# Patient Record
Sex: Male | Born: 1950 | Race: Black or African American | Hispanic: No | Smoking: Former smoker
Health system: Southern US, Community
[De-identification: ages and names within clinical notes are randomized; demographics above are authoritative.]

## PROBLEM LIST (undated history)

## (undated) DIAGNOSIS — E785 Hyperlipidemia, unspecified: Secondary | ICD-10-CM

## (undated) DIAGNOSIS — Z9289 Personal history of other medical treatment: Secondary | ICD-10-CM

## (undated) DIAGNOSIS — D494 Neoplasm of unspecified behavior of bladder: Secondary | ICD-10-CM

## (undated) DIAGNOSIS — M1990 Unspecified osteoarthritis, unspecified site: Secondary | ICD-10-CM

## (undated) DIAGNOSIS — I447 Left bundle-branch block, unspecified: Secondary | ICD-10-CM

## (undated) DIAGNOSIS — E119 Type 2 diabetes mellitus without complications: Secondary | ICD-10-CM

## (undated) DIAGNOSIS — I639 Cerebral infarction, unspecified: Secondary | ICD-10-CM

## (undated) DIAGNOSIS — I5189 Other ill-defined heart diseases: Secondary | ICD-10-CM

## (undated) DIAGNOSIS — I10 Essential (primary) hypertension: Secondary | ICD-10-CM

## (undated) DIAGNOSIS — K219 Gastro-esophageal reflux disease without esophagitis: Secondary | ICD-10-CM

## (undated) HISTORY — DX: Type 2 diabetes mellitus without complications: E11.9

## (undated) HISTORY — DX: Left bundle-branch block, unspecified: I44.7

## (undated) HISTORY — DX: Cerebral infarction, unspecified: I63.9

## (undated) HISTORY — DX: Hyperlipidemia, unspecified: E78.5

## (undated) HISTORY — PX: COLONOSCOPY: SHX174

## (undated) HISTORY — DX: Essential (primary) hypertension: I10

## (undated) HISTORY — DX: Personal history of other medical treatment: Z92.89

## (undated) HISTORY — DX: Other ill-defined heart diseases: I51.89

---

## 2007-04-02 ENCOUNTER — Ambulatory Visit: Payer: Self-pay | Admitting: Family Medicine

## 2007-05-02 ENCOUNTER — Ambulatory Visit: Payer: Self-pay | Admitting: Gastroenterology

## 2008-08-25 ENCOUNTER — Emergency Department: Payer: Self-pay | Admitting: Internal Medicine

## 2009-01-06 ENCOUNTER — Emergency Department: Payer: Self-pay | Admitting: Emergency Medicine

## 2009-03-03 ENCOUNTER — Emergency Department: Payer: Self-pay | Admitting: Unknown Physician Specialty

## 2009-03-06 ENCOUNTER — Emergency Department: Payer: Self-pay | Admitting: Emergency Medicine

## 2009-08-18 ENCOUNTER — Ambulatory Visit: Payer: Self-pay | Admitting: Gastroenterology

## 2013-03-26 ENCOUNTER — Ambulatory Visit: Payer: Self-pay | Admitting: Family Medicine

## 2013-03-26 LAB — DOT URINE DIP
Glucose,UR: 1000 mg/dL (ref 0–75)
Protein: NEGATIVE

## 2013-04-12 ENCOUNTER — Emergency Department: Payer: Self-pay | Admitting: Emergency Medicine

## 2014-07-27 ENCOUNTER — Ambulatory Visit: Payer: Self-pay | Admitting: Internal Medicine

## 2017-08-07 ENCOUNTER — Other Ambulatory Visit: Payer: Self-pay

## 2017-08-07 MED ORDER — AMLODIPINE BESYLATE 10 MG PO TABS: 10.0000 mg | ORAL_TABLET | Freq: Every day | ORAL | 3 refills | Status: DC

## 2017-08-07 MED ORDER — LISINOPRIL-HYDROCHLOROTHIAZIDE 20-12.5 MG PO TABS: ORAL_TABLET | ORAL | 3 refills | Status: DC

## 2017-08-23 ENCOUNTER — Other Ambulatory Visit: Payer: Self-pay

## 2017-08-23 MED ORDER — LIRAGLUTIDE 18 MG/3ML ~~LOC~~ SOPN: 1.8000 mg | PEN_INJECTOR | Freq: Every day | SUBCUTANEOUS | 5 refills | Status: DC

## 2017-09-25 ENCOUNTER — Ambulatory Visit: Payer: Self-pay | Admitting: Nurse Practitioner

## 2017-10-15 ENCOUNTER — Other Ambulatory Visit: Payer: Self-pay | Admitting: Nurse Practitioner

## 2017-10-16 LAB — COMPREHENSIVE METABOLIC PANEL
ALBUMIN: 4.6 g/dL (ref 3.6–4.8)
ALT: 43 IU/L (ref 0–44)
AST: 32 IU/L (ref 0–40)
Albumin/Globulin Ratio: 1.5 (ref 1.2–2.2)
Alkaline Phosphatase: 173 IU/L — ABNORMAL HIGH (ref 39–117)
BUN / CREAT RATIO: 9 — AB (ref 10–24)
BUN: 12 mg/dL (ref 8–27)
Bilirubin Total: 0.2 mg/dL (ref 0.0–1.2)
CALCIUM: 10.3 mg/dL — AB (ref 8.6–10.2)
CO2: 23 mmol/L (ref 20–29)
CREATININE: 1.33 mg/dL — AB (ref 0.76–1.27)
Chloride: 101 mmol/L (ref 96–106)
GFR, EST AFRICAN AMERICAN: 64 mL/min/{1.73_m2} (ref >59)
GFR, EST NON AFRICAN AMERICAN: 56 mL/min/{1.73_m2} — AB (ref >59)
GLUCOSE: 181 mg/dL — AB (ref 65–99)
Globulin, Total: 3.1 g/dL (ref 1.5–4.5)
Potassium: 4 mmol/L (ref 3.5–5.2)
Sodium: 147 mmol/L — ABNORMAL HIGH (ref 134–144)
TOTAL PROTEIN: 7.7 g/dL (ref 6.0–8.5)

## 2017-10-18 ENCOUNTER — Ambulatory Visit: Payer: BLUE CROSS/BLUE SHIELD | Admitting: Nurse Practitioner

## 2017-10-18 ENCOUNTER — Encounter: Payer: Self-pay | Admitting: Nurse Practitioner

## 2017-10-18 VITALS — BP 145/85 | HR 81 | Resp 16 | Ht 74.0 in | Wt 215.2 lb

## 2017-10-18 DIAGNOSIS — I10 Essential (primary) hypertension: Secondary | ICD-10-CM | POA: Diagnosis not present

## 2017-10-18 DIAGNOSIS — E1140 Type 2 diabetes mellitus with diabetic neuropathy, unspecified: Secondary | ICD-10-CM | POA: Diagnosis not present

## 2017-10-18 DIAGNOSIS — J029 Acute pharyngitis, unspecified: Secondary | ICD-10-CM

## 2017-10-18 DIAGNOSIS — E11649 Type 2 diabetes mellitus with hypoglycemia without coma: Secondary | ICD-10-CM

## 2017-10-18 DIAGNOSIS — J0140 Acute pansinusitis, unspecified: Secondary | ICD-10-CM

## 2017-10-18 DIAGNOSIS — Z794 Long term (current) use of insulin: Secondary | ICD-10-CM

## 2017-10-18 LAB — POCT GLYCOSYLATED HEMOGLOBIN (HGB A1C): Hemoglobin A1C: 8.1

## 2017-10-18 MED ORDER — LISINOPRIL-HYDROCHLOROTHIAZIDE 20-12.5 MG PO TABS: ORAL_TABLET | ORAL | 3 refills | Status: DC

## 2017-10-18 MED ORDER — GABAPENTIN 100 MG PO CAPS: 100.0000 mg | ORAL_CAPSULE | Freq: Every day | ORAL | 5 refills | Status: DC

## 2017-10-18 MED ORDER — AZITHROMYCIN 250 MG PO TABS: ORAL_TABLET | ORAL | 0 refills | Status: DC

## 2017-10-18 MED ORDER — GABAPENTIN 100 MG PO CAPS: 100.0000 mg | ORAL_CAPSULE | Freq: Every day | ORAL | 1 refills | Status: DC

## 2017-10-18 MED ORDER — FIRST-DUKES MOUTHWASH MT SUSP: OROMUCOSAL | 0 refills | Status: DC

## 2017-10-18 NOTE — Progress Notes (Signed)
[facility] [address] [place], [place] [address]  Internal MEDICINE  Office Visit Note  Patient Name: Gabriel Burton  [id number]  [id number]  Date of Service: 11/11/2017   Pt is here for routine follow up  Chief Complaint  Patient presents with  . Diabetes  . Sore Throat    The patient is here for routine follow up visit. Today, he is reporting blood sugars between 120 and 150. He feels like his sugars are doing ok, overall.  He has sore throat and congestion. Has been going on a little over a week.  Reports right knee pain/weakness. Gives out on him from time to time. Tenderness is along the inner aspect of the knee. Along the "ACL"   .    Current Medication: Outpatient Encounter Medications as of 10/18/2017  Medication Sig Note  . aspirin EC 81 MG tablet Take 81 mg by mouth.   Marland Kitchen atorvastatin (LIPITOR) 10 MG tablet Take 10 mg by mouth.   . Cholecalciferol (VITAMIN D3) 5000 units TABS Take by mouth.   Marland Kitchen CINNAMON PO Take by mouth.   . gabapentin (NEURONTIN) 100 MG capsule Take 1 capsule (100 mg total) by mouth daily.   . Insulin Glargine 300 UNIT/ML SOPN Inject into the skin. 10/18/2017: Take 30 units [place] QD   . liraglutide (VICTOZA) 18 MG/3ML SOPN Inject 0.3 mLs (1.8 mg total) into the skin daily.   Marland Kitchen lisinopril-hydrochlorothiazide (PRINZIDE,ZESTORETIC) 20-12.5 MG tablet Take 2 tab by po once a daily for hypertension   . tamsulosin (FLOMAX) 0.4 MG CAPS capsule Take 0.4 mg by mouth.   Nathen May SOLOSTAR 300 UNIT/ML SOPN Inject 30 Units into the skin daily.   . traMADol (ULTRAM) 50 MG tablet Take 50 mg by mouth.   . vitamin C (ASCORBIC ACID) 500 MG tablet Take 500 mg by mouth.   . [DISCONTINUED] amLODipine (NORVASC) 10 MG tablet Take 1 tablet (10 mg total) by mouth daily.   . [DISCONTINUED] gabapentin (NEURONTIN) 100 MG capsule Take 100 mg by mouth.   . [DISCONTINUED] gabapentin (NEURONTIN) 100 MG capsule Take 1 capsule (100 mg total) by mouth daily.   .  [DISCONTINUED] lisinopril-hydrochlorothiazide (PRINZIDE,ZESTORETIC) 20-12.5 MG tablet Take 2 tab by po once a daily for hypertension   . azithromycin (ZITHROMAX) 250 MG tablet z-pack - take as directed for 5 days   . Diphenhyd-Hydrocort-Nystatin (FIRST-DUKES MOUTHWASH) SUSP Swish and sallow QID prn sore throat.   . pantoprazole (PROTONIX) 40 MG tablet Take 40 mg by mouth.   . [DISCONTINUED] atorvastatin (LIPITOR) 10 MG tablet    . [DISCONTINUED] glimepiride (AMARYL) 2 MG tablet Take 2 mg by mouth.   . [DISCONTINUED] tamsulosin (FLOMAX) 0.4 MG CAPS capsule     No facility-administered encounter medications on file as of 10/18/2017.     Surgical History: History reviewed. No pertinent surgical history.  Medical History: No past medical history on file.  Family History: No family history on file.  Social History   Socioeconomic History  . Marital status: Widowed    Spouse name: Not on file  . Number of children: Not on file  . Years of education: Not on file  . Highest education level: Not on file  Occupational History  . Not on file  Social Needs  . Financial resource strain: Not on file  . Food insecurity:    Worry: Not on file    Inability: Not on file  . Transportation needs:    Medical: Not on file  Non-medical: Not on file  Tobacco Use  . Smoking status: Never Smoker  . Smokeless tobacco: Never Used  Substance and Sexual Activity  . Alcohol use: Never    Frequency: Never  . Drug use: Never  . Sexual activity: Not on file  Lifestyle  . Physical activity:    Days per week: Not on file    Minutes per session: Not on file  . Stress: Not on file  Relationships  . Social connections:    Talks on phone: Not on file    Gets together: Not on file    Attends religious service: Not on file    Active member of club or organization: Not on file    Attends meetings of clubs or organizations: Not on file    Relationship status: Not on file  . Intimate partner  violence:    Fear of current or ex partner: Not on file    Emotionally abused: Not on file    Physically abused: Not on file    Forced sexual activity: Not on file  Other Topics Concern  . Not on file  Social History Narrative  . Not on file      Review of Systems  Constitutional: Negative for activity change, chills, fatigue and unexpected weight change.  HENT: Positive for congestion, postnasal drip, rhinorrhea, sore throat and voice change. Negative for sneezing.   Eyes: Negative.  Negative for redness.  Respiratory: Positive for cough. Negative for chest tightness and shortness of breath.   Cardiovascular: Negative for chest pain and palpitations.  Gastrointestinal: Negative for abdominal pain, constipation, diarrhea, nausea and vomiting.  Endocrine:       Blood sugars doing well   Genitourinary: Negative.  Negative for dysuria and frequency.  Musculoskeletal: Positive for arthralgias. Negative for back pain, joint swelling and neck pain.       Right knee pain/weakness along the inner aspect of right knee  Allergic/Immunologic: Positive for environmental allergies.  Neurological: Positive for headaches. Negative for tremors and numbness.  Hematological: Negative for adenopathy. Does not bruise/bleed easily.  Psychiatric/Behavioral: Negative for behavioral problems (Depression), sleep disturbance and suicidal ideas. The patient is not nervous/anxious.     Today's Vitals   10/18/17 1434  BP: (!) 145/85  Pulse: 81  Resp: 16  SpO2: 96%  Weight: 215 lb 3.2 oz (97.6 kg)  Height: 6\' 2"  (1.88 m)    Physical Exam  Constitutional: He is oriented to person, place, and time. He appears well-developed and well-nourished. No distress.  HENT:  Head: Normocephalic and atraumatic.  Mouth/Throat: Oropharynx is clear and moist. No oropharyngeal exudate.  Eyes: Pupils are equal, round, and reactive to light. EOM are normal.  Neck: Normal range of motion. Neck supple. No JVD present. No  tracheal deviation present. No thyromegaly present.  Cardiovascular: Normal rate, regular rhythm and normal heart sounds. Exam reveals no gallop and no friction rub.  No murmur heard. Pulmonary/Chest: Effort normal. No respiratory distress. He has no wheezes. He has no rales. He exhibits no tenderness.  Abdominal: Soft. Bowel sounds are normal.  Musculoskeletal: Normal range of motion.  Lymphadenopathy:    He has no cervical adenopathy.  Neurological: He is alert and oriented to person, place, and time. No cranial nerve deficit.  Skin: Skin is warm and dry. He is not diaphoretic.  Psychiatric: He has a normal mood and affect. His behavior is normal. Judgment and thought content normal.  Nursing note and vitals reviewed.   Assessment/Plan: 1. Uncontrolled type  2 diabetes mellitus with hypoglycemia, unspecified hypoglycemia coma status (HCC) - POCT HgB A1C 8.1 today. Gradually improving. Will continue diabetic medications as prescribed. Emphasized importance of following ADA diet and increasing exercise.   2. Type 2 diabetes mellitus with diabetic neuropathy, with long-term current use of insulin (HCC) Well controlled. Continue gabapentin 100mg  daily as needed.  - gabapentin (NEURONTIN) 100 MG capsule; Take 1 capsule (100 mg total) by mouth daily.  Dispense: 90 capsule; Refill: 1  3. Sore throat Swish and swallow [employer] Magic couthwas as needed for sore throat.  - Diphenhyd-Hydrocort-Nystatin (FIRST-DUKES MOUTHWASH) SUSP; Swish and sallow QID prn sore throat.  Dispense: 200 mL; Refill: 0  4. Acute non-recurrent pansinusitis z-pac - takke as directed for 5 days. Rest and increase fluids. Use OTC medications to alleviate symptoms.  - azithromycin (ZITHROMAX) 250 MG tablet; z-pack - take as directed for 5 days  Dispense: 6 tablet; Refill: 0  5. Essential hypertension Stable. Continue bp medication as prescribed.  - lisinopril-hydrochlorothiazide (PRINZIDE,ZESTORETIC) 20-12.5 MG  tablet; Take 2 tab by po once a daily for hypertension  Dispense: 180 tablet; Refill: 3  General Counseling: Paulo verbalizes understanding of the findings of todays visit and agrees with plan of treatment. I have discussed any further diagnostic evaluation that may be needed or ordered today. We also reviewed his medications today. he has been encouraged to call the office with any questions or concerns that should arise related to todays visit.  Rest and increase fluids. Continue using OTC medication to control symptoms.   Diabetes Counseling:  1. Addition of ACE inh/ ARB'S for nephroprotection. 2. Diabetic foot care, prevention of complications.  3.Exercise and lose weight.  4. Diabetic eye examination, 5. Monitor blood sugar closlely. nutrition counseling.  6.Sign and symptoms of hypoglycemia including shaking sweating,confusion and headaches.  This patient was seen by Vincent Gros, FNP- C in Collaboration with Dr Lyndon Code as a part of collaborative care agreement   Orders Placed This Encounter  Procedures  . POCT HgB A1C    Meds ordered this encounter  Medications  . azithromycin (ZITHROMAX) 250 MG tablet    Sig: z-pack - take as directed for 5 days    Dispense:  6 tablet    Refill:  0    Order Specific Question:   Supervising Provider    Answer:   Lyndon Code [[id number]]  . Diphenhyd-Hydrocort-Nystatin (FIRST-DUKES MOUTHWASH) SUSP    Sig: Swish and sallow QID prn sore throat.    Dispense:  200 mL    Refill:  0    Order Specific Question:   Supervising Provider    Answer:   Lyndon Code [[id number]]  . DISCONTD: gabapentin (NEURONTIN) 100 MG capsule    Sig: Take 1 capsule (100 mg total) by mouth daily.    Dispense:  30 capsule    Refill:  5    Order Specific Question:   Supervising Provider    Answer:   Lyndon Code [[id number]]  . lisinopril-hydrochlorothiazide (PRINZIDE,ZESTORETIC) 20-12.5 MG tablet    Sig: Take 2 tab by po once a daily for hypertension    Dispense:   180 tablet    Refill:  3    Please fill as 90 day prescription. thanks    Order Specific Question:   Supervising Provider    Answer:   Lyndon Code [[id number]]  . gabapentin (NEURONTIN) 100 MG capsule    Sig: Take 1 capsule (100 mg total) by mouth daily.  Dispense:  90 capsule    Refill:  1    Please fill as 90 day prescription.    Order Specific Question:   Supervising Provider    Answer:   Lyndon Code [[id number]]    Time spent: 41 Minutes       Dr Lyndon Code Internal medicine

## 2017-11-08 ENCOUNTER — Other Ambulatory Visit: Payer: Self-pay

## 2017-11-08 MED ORDER — INSULIN PEN NEEDLE 32G X 4 MM MISC: 3 refills | Status: DC

## 2017-11-08 MED ORDER — AMLODIPINE BESYLATE 10 MG PO TABS: 10.0000 mg | ORAL_TABLET | Freq: Every day | ORAL | 3 refills | Status: DC

## 2017-11-11 ENCOUNTER — Encounter: Payer: Self-pay | Admitting: Nurse Practitioner

## 2017-11-11 DIAGNOSIS — Z794 Long term (current) use of insulin: Secondary | ICD-10-CM

## 2017-11-11 DIAGNOSIS — J029 Acute pharyngitis, unspecified: Secondary | ICD-10-CM | POA: Insufficient documentation

## 2017-11-11 DIAGNOSIS — E1159 Type 2 diabetes mellitus with other circulatory complications: Secondary | ICD-10-CM | POA: Insufficient documentation

## 2017-11-11 DIAGNOSIS — E1140 Type 2 diabetes mellitus with diabetic neuropathy, unspecified: Secondary | ICD-10-CM | POA: Insufficient documentation

## 2017-11-11 DIAGNOSIS — E1122 Type 2 diabetes mellitus with diabetic chronic kidney disease: Secondary | ICD-10-CM | POA: Insufficient documentation

## 2017-11-11 DIAGNOSIS — E119 Type 2 diabetes mellitus without complications: Secondary | ICD-10-CM | POA: Insufficient documentation

## 2017-11-11 DIAGNOSIS — N1831 Chronic kidney disease, stage 3a: Secondary | ICD-10-CM | POA: Insufficient documentation

## 2017-11-11 DIAGNOSIS — J0140 Acute pansinusitis, unspecified: Secondary | ICD-10-CM | POA: Insufficient documentation

## 2017-11-11 DIAGNOSIS — I10 Essential (primary) hypertension: Secondary | ICD-10-CM | POA: Insufficient documentation

## 2017-11-28 ENCOUNTER — Other Ambulatory Visit: Payer: Self-pay | Admitting: Nurse Practitioner

## 2017-11-28 MED ORDER — TOUJEO SOLOSTAR 300 UNIT/ML ~~LOC~~ SOPN: 30.0000 [IU] | PEN_INJECTOR | Freq: Every day | SUBCUTANEOUS | 3 refills | Status: DC

## 2017-12-17 ENCOUNTER — Other Ambulatory Visit: Payer: Self-pay

## 2017-12-17 DIAGNOSIS — J0140 Acute pansinusitis, unspecified: Secondary | ICD-10-CM

## 2017-12-17 MED ORDER — AZITHROMYCIN 250 MG PO TABS: ORAL_TABLET | ORAL | 0 refills | Status: DC

## 2017-12-17 NOTE — Telephone Encounter (Signed)
Pt called that he is not feeling good coughing  And sinus infection as per heather send  zpak

## 2018-01-21 ENCOUNTER — Ambulatory Visit: Payer: Self-pay | Admitting: Nurse Practitioner

## 2018-01-24 ENCOUNTER — Encounter: Payer: Self-pay | Admitting: Nurse Practitioner

## 2018-01-24 ENCOUNTER — Ambulatory Visit (INDEPENDENT_AMBULATORY_CARE_PROVIDER_SITE_OTHER): Payer: BLUE CROSS/BLUE SHIELD | Admitting: Nurse Practitioner

## 2018-01-24 VITALS — BP 156/88 | HR 72 | Temp 96.1°F | Resp 16 | Ht 74.0 in | Wt 214.0 lb

## 2018-01-24 DIAGNOSIS — J300 Vasomotor rhinitis: Secondary | ICD-10-CM

## 2018-01-24 DIAGNOSIS — E1165 Type 2 diabetes mellitus with hyperglycemia: Secondary | ICD-10-CM

## 2018-01-24 DIAGNOSIS — J0140 Acute pansinusitis, unspecified: Secondary | ICD-10-CM | POA: Diagnosis not present

## 2018-01-24 DIAGNOSIS — I10 Essential (primary) hypertension: Secondary | ICD-10-CM | POA: Diagnosis not present

## 2018-01-24 MED ORDER — CEFUROXIME AXETIL 500 MG PO TABS: 500.0000 mg | ORAL_TABLET | Freq: Two times a day (BID) | ORAL | 0 refills | Status: DC

## 2018-01-24 MED ORDER — FLUTICASONE PROPIONATE 50 MCG/ACT NA SUSP: 2.0000 | Freq: Every day | NASAL | 6 refills | Status: DC

## 2018-01-24 NOTE — Progress Notes (Signed)
[facility] [address] [place], [place] [address]  Internal MEDICINE  Office Visit Note  Patient Name: Gabriel SorrowJohnny L Burton  [id number]  [id number]  Date of Service: 01/24/2018   Pt is here for a sick visit.  Chief Complaint  Patient presents with  . Sinusitis     Sinusitis  This is a new problem. The current episode started in the past 7 days. The problem has been gradually improving since onset. There has been no fever. Associated symptoms include chills, congestion, coughing, headaches, sinus pressure and a sore throat. Past treatments include acetaminophen and oral decongestants. The treatment provided mild relief.        Current Medication:  Outpatient Encounter Medications as of 01/24/2018  Medication Sig Note  . amLODipine (NORVASC) 10 MG tablet Take 1 tablet (10 mg total) by mouth daily.   Marland Kitchen. aspirin EC 81 MG tablet Take 81 mg by mouth.   Marland Kitchen. atorvastatin (LIPITOR) 10 MG tablet Take 10 mg by mouth.   Marland Kitchen. azithromycin (ZITHROMAX) 250 MG tablet z-pack - take as directed for 5 days   . Cholecalciferol (VITAMIN D3) 5000 units TABS Take by mouth.   Marland Kitchen. CINNAMON PO Take by mouth.   . gabapentin (NEURONTIN) 100 MG capsule Take 1 capsule (100 mg total) by mouth daily.   . Insulin Glargine 300 UNIT/ML SOPN Inject into the skin. 10/18/2017: Take 30 units [place] QD   . Insulin Pen Needle (BD PEN NEEDLE NANO U/F) 32G X 4 MM MISC USE AS DIRECTED  TWICE  A DAILY DIAG E11.65   . liraglutide (VICTOZA) 18 MG/3ML SOPN Inject 0.3 mLs (1.8 mg total) into the skin daily.   Marland Kitchen. lisinopril-hydrochlorothiazide (PRINZIDE,ZESTORETIC) 20-12.5 MG tablet Take 2 tab by po once a daily for hypertension   . pantoprazole (PROTONIX) 40 MG tablet Take 40 mg by mouth.   . tamsulosin (FLOMAX) 0.4 MG CAPS capsule Take 0.4 mg by mouth.   Nathen May. TOUJEO SOLOSTAR 300 UNIT/ML SOPN Inject 30 Units into the skin daily.   . traMADol (ULTRAM) 50 MG tablet Take 50 mg by mouth.   . vitamin C (ASCORBIC ACID) 500 MG tablet  Take 500 mg by mouth.   . cefUROXime (CEFTIN) 500 MG tablet Take 1 tablet (500 mg total) by mouth 2 (two) times daily with a meal.   . Diphenhyd-Hydrocort-Nystatin (FIRST-DUKES MOUTHWASH) SUSP Swish and sallow 5mls QID prn sore throat. (Patient not taking: Reported on 01/24/2018)   . fluticasone (FLONASE) 50 MCG/ACT nasal spray Place 2 sprays into both nostrils daily.    No facility-administered encounter medications on file as of 01/24/2018.       Medical History: No past medical history on file.   Vital Signs: BP (!) 156/88   Pulse 72   Temp (!) 96.1 F (35.6 C)   Resp 16   Ht 6\' 2"  (1.88 m)   Wt 214 lb (97.1 kg)   SpO2 97%   BMI 27.48 kg/m    Review of Systems  Constitutional: Positive for chills and fatigue. Negative for fever.  HENT: Positive for congestion, rhinorrhea, sinus pressure, sinus pain and sore throat.   Eyes: Negative.   Respiratory: Positive for cough.   Cardiovascular: Negative for chest pain and palpitations.  Gastrointestinal: Negative for constipation, diarrhea and vomiting.  Endocrine:       Blood sugars doing well   Genitourinary: Negative.   Musculoskeletal: Negative for arthralgias, back pain and neck stiffness.  Skin: Negative for rash.  Allergic/Immunologic: Positive for environmental  allergies.  Neurological: Positive for headaches.  Hematological: Negative for adenopathy.  Psychiatric/Behavioral: Negative.     Physical Exam  Constitutional: He is oriented to person, place, and time. He appears well-developed and well-nourished. No distress.  HENT:  Head: Normocephalic and atraumatic.  Right Ear: Tympanic membrane is erythematous.  Left Ear: Tympanic membrane is erythematous.  Nose: Rhinorrhea present. Right sinus exhibits frontal sinus tenderness. Left sinus exhibits frontal sinus tenderness.  Mouth/Throat: Posterior oropharyngeal erythema present. No oropharyngeal exudate.  Eyes: Pupils are equal, round, and reactive to light. EOM are  normal.  Neck: Normal range of motion. Neck supple. No JVD present. No tracheal deviation present. No thyromegaly present.  Cardiovascular: Normal rate, regular rhythm and normal heart sounds. Exam reveals no gallop and no friction rub.  No murmur heard. Pulmonary/Chest: Effort normal and breath sounds normal. No respiratory distress. He has no wheezes. He has no rales. He exhibits no tenderness.  Abdominal: Soft. Bowel sounds are normal. There is no tenderness.  Musculoskeletal: Normal range of motion.  Lymphadenopathy:    He has no cervical adenopathy.  Neurological: He is alert and oriented to person, place, and time. No cranial nerve deficit.  Skin: Skin is warm and dry. He is not diaphoretic.  Psychiatric: He has a normal mood and affect. His behavior is normal. Judgment and thought content normal.  Nursing note and vitals reviewed.  Assessment/Plan:  1. Acute non-recurrent pansinusitis Start Ceftin 500mg  bid for 7 days. Use OTC medication to help alleviate symptoms.  - cefUROXime (CEFTIN) 500 MG tablet; Take 1 tablet (500 mg total) by mouth 2 (two) times daily with a meal.  Dispense: 14 tablet; Refill: 0  2. Vasomotor rhinitis Restart flonase. Use 2 sprays in both nostrils daily. - fluticasone (FLONASE) 50 MCG/ACT nasal spray; Place 2 sprays into both nostrils daily.  Dispense: 16 g; Refill: 6  3. Essential hypertension bp slightly elevated today, likely from oral decongestant use. Will monitor.   4. Type 2 diabetes mellitus with hyperglycemia, unspecified whether long term insulin use (HCC) Continue DM medication as prescribed. Check HgbA1c at next visit.   General Counseling: Charly verbalizes understanding of the findings of todays visit and agrees with plan of treatment. I have discussed any further diagnostic evaluation that may be needed or ordered today. We also reviewed his medications today. he has been encouraged to call the office with any questions or concerns that  should arise related to todays visit.    Counseling:  Rest and increase fluids. Continue using OTC medication to control symptoms.   This patient was seen by Vincent Gros, FNP- C in Collaboration with Dr Lyndon Code as a part of collaborative care agreement  Meds ordered this encounter  Medications  . cefUROXime (CEFTIN) 500 MG tablet    Sig: Take 1 tablet (500 mg total) by mouth 2 (two) times daily with a meal.    Dispense:  14 tablet    Refill:  0    Order Specific Question:   Supervising Provider    Answer:   Lyndon Code [[id number]]  . fluticasone (FLONASE) 50 MCG/ACT nasal spray    Sig: Place 2 sprays into both nostrils daily.    Dispense:  16 g    Refill:  6    Order Specific Question:   Supervising Provider    Answer:   Lyndon Code [[id number]]    Time spent: 15 Minutes

## 2018-01-29 ENCOUNTER — Ambulatory Visit: Payer: Self-pay | Admitting: Nurse Practitioner

## 2018-02-18 ENCOUNTER — Other Ambulatory Visit: Payer: Self-pay

## 2018-02-18 MED ORDER — AMLODIPINE BESYLATE 10 MG PO TABS: 10.0000 mg | ORAL_TABLET | Freq: Every day | ORAL | 5 refills | Status: DC

## 2018-02-28 ENCOUNTER — Other Ambulatory Visit: Payer: Self-pay

## 2018-02-28 MED ORDER — PANTOPRAZOLE SODIUM 40 MG PO TBEC: 40.0000 mg | DELAYED_RELEASE_TABLET | Freq: Every day | ORAL | 5 refills | Status: DC

## 2018-02-28 MED ORDER — AMLODIPINE BESYLATE 10 MG PO TABS: 10.0000 mg | ORAL_TABLET | Freq: Every day | ORAL | 5 refills | Status: DC

## 2018-03-04 ENCOUNTER — Encounter: Payer: Self-pay | Admitting: Nurse Practitioner

## 2018-03-04 ENCOUNTER — Ambulatory Visit: Payer: BLUE CROSS/BLUE SHIELD | Admitting: Nurse Practitioner

## 2018-03-04 VITALS — BP 144/79 | HR 77 | Resp 16 | Ht 74.0 in | Wt 214.8 lb

## 2018-03-04 DIAGNOSIS — I10 Essential (primary) hypertension: Secondary | ICD-10-CM

## 2018-03-04 DIAGNOSIS — N529 Male erectile dysfunction, unspecified: Secondary | ICD-10-CM

## 2018-03-04 DIAGNOSIS — E1165 Type 2 diabetes mellitus with hyperglycemia: Secondary | ICD-10-CM | POA: Diagnosis not present

## 2018-03-04 LAB — POCT GLYCOSYLATED HEMOGLOBIN (HGB A1C): HEMOGLOBIN A1C: 7.3 % — AB (ref 4.0–5.6)

## 2018-03-04 MED ORDER — TOUJEO SOLOSTAR 300 UNIT/ML ~~LOC~~ SOPN: 30.0000 [IU] | PEN_INJECTOR | Freq: Every day | SUBCUTANEOUS | 3 refills | Status: DC

## 2018-03-04 MED ORDER — SILDENAFIL CITRATE 100 MG PO TABS: 100.0000 mg | ORAL_TABLET | Freq: Every day | ORAL | 5 refills | Status: DC | PRN

## 2018-03-04 NOTE — Progress Notes (Signed)
[facility] [address] [place], [place] [address]  Internal MEDICINE  Office Visit Note  Patient Name: Gabriel Burton  [id number]  [id number]  Date of Service: 03/04/2018  Chief Complaint  Patient presents with  . Diabetes    3 month follow up    Diabetes  He presents for his follow-up diabetic visit. He has type 2 diabetes mellitus. His disease course has been improving. There are no hypoglycemic associated symptoms. Pertinent negatives for hypoglycemia include no nervousness/anxiousness or tremors. Associated symptoms include foot paresthesias. Pertinent negatives for diabetes include no chest pain and no fatigue. There are no hypoglycemic complications. Symptoms are improving. Risk factors for coronary artery disease include dyslipidemia, male sex and hypertension. Current diabetic treatment includes insulin injections (victoz). He is compliant with treatment all of the time. He is following a generally healthy diet. Meal planning includes avoidance of concentrated sweets. He has had a previous visit with a dietitian. He participates in exercise three times a week. An ACE inhibitor/angiotensin II receptor blocker is being taken. He does not see a podiatrist.Eye exam is current.       Current Medication: Outpatient Encounter Medications as of 03/04/2018  Medication Sig Note  . amLODipine (NORVASC) 10 MG tablet Take 1 tablet (10 mg total) by mouth daily.   Marland Kitchen aspirin EC 81 MG tablet Take 81 mg by mouth.   Marland Kitchen atorvastatin (LIPITOR) 10 MG tablet Take 10 mg by mouth.   Marland Kitchen azithromycin (ZITHROMAX) 250 MG tablet z-pack - take as directed for 5 days   . cefUROXime (CEFTIN) 500 MG tablet Take 1 tablet (500 mg total) by mouth 2 (two) times daily with a meal.   . Cholecalciferol (VITAMIN D3) 5000 units TABS Take by mouth.   Marland Kitchen CINNAMON PO Take by mouth.   . Diphenhyd-Hydrocort-Nystatin (FIRST-DUKES MOUTHWASH) SUSP Swish and sallow QID prn sore throat.   . fluticasone  (FLONASE) 50 MCG/ACT nasal spray Place 2 sprays into both nostrils daily.   Marland Kitchen gabapentin (NEURONTIN) 100 MG capsule Take 1 capsule (100 mg total) by mouth daily.   . Insulin Pen Needle (BD PEN NEEDLE NANO U/F) 32G X 4 MM MISC USE AS DIRECTED  TWICE  A DAILY DIAG E11.65   . liraglutide (VICTOZA) 18 MG/3ML SOPN Inject 0.3 mLs (1.8 mg total) into the skin daily.   Marland Kitchen lisinopril-hydrochlorothiazide (PRINZIDE,ZESTORETIC) 20-12.5 MG tablet Take 2 tab by po once a daily for hypertension   . pantoprazole (PROTONIX) 40 MG tablet Take 1 tablet (40 mg total) by mouth daily.   . tamsulosin (FLOMAX) 0.4 MG CAPS capsule Take 0.4 mg by mouth.   Nathen May SOLOSTAR 300 UNIT/ML SOPN Inject 30 Units into the skin daily.   . traMADol (ULTRAM) 50 MG tablet Take 50 mg by mouth.   . vitamin C (ASCORBIC ACID) 500 MG tablet Take 500 mg by mouth.   . [DISCONTINUED] Insulin Glargine 300 UNIT/ML SOPN Inject into the skin. 10/18/2017: Take 30 units [place] QD   . [DISCONTINUED] TOUJEO SOLOSTAR 300 UNIT/ML SOPN Inject 30 Units into the skin daily.   . sildenafil (VIAGRA) 100 MG tablet Take 1 tablet (100 mg total) by mouth daily as needed for erectile dysfunction.    No facility-administered encounter medications on file as of 03/04/2018.     Surgical History: History reviewed. No pertinent surgical history.  Medical History: Past Medical History:  Diagnosis Date  . Diabetes mellitus without complication (HCC)   . Hypertension     Family History:  History reviewed. No pertinent family history.  Social History   Socioeconomic History  . Marital status: Widowed    Spouse name: Not on file  . Number of children: Not on file  . Years of education: Not on file  . Highest education level: Not on file  Occupational History  . Not on file  Social Needs  . Financial resource strain: Not on file  . Food insecurity:    Worry: Not on file    Inability: Not on file  . Transportation needs:    Medical: Not on file     Non-medical: Not on file  Tobacco Use  . Smoking status: Never Smoker  . Smokeless tobacco: Never Used  Substance and Sexual Activity  . Alcohol use: Never    Frequency: Never  . Drug use: Never  . Sexual activity: Not on file  Lifestyle  . Physical activity:    Days per week: Not on file    Minutes per session: Not on file  . Stress: Not on file  Relationships  . Social connections:    Talks on phone: Not on file    Gets together: Not on file    Attends religious service: Not on file    Active member of club or organization: Not on file    Attends meetings of clubs or organizations: Not on file    Relationship status: Not on file  . Intimate partner violence:    Fear of current or ex partner: Not on file    Emotionally abused: Not on file    Physically abused: Not on file    Forced sexual activity: Not on file  Other Topics Concern  . Not on file  Social History Narrative  . Not on file      Review of Systems  Constitutional: Negative for activity change, chills, fatigue and unexpected weight change.  HENT: Negative for congestion, postnasal drip, rhinorrhea, sneezing, sore throat and voice change.   Eyes: Negative.  Negative for redness.  Respiratory: Negative for cough, chest tightness and shortness of breath.   Cardiovascular: Negative for chest pain and palpitations.  Gastrointestinal: Negative for abdominal pain, constipation, diarrhea, nausea and vomiting.  Endocrine:       Blood sugars doing well   Genitourinary: Negative.  Negative for dysuria and frequency.  Musculoskeletal: Negative for arthralgias, back pain, joint swelling and neck pain.       Right knee pain/weakness along the inner aspect of right knee  Allergic/Immunologic: Positive for environmental allergies.  Neurological: Negative for tremors and numbness.  Hematological: Negative for adenopathy. Does not bruise/bleed easily.  Psychiatric/Behavioral: Negative for behavioral problems (Depression),  sleep disturbance and suicidal ideas. The patient is not nervous/anxious.     Today's Vitals   03/04/18 0928  BP: (!) 144/79  Pulse: 77  Resp: 16  SpO2: 95%  Weight: 214 lb 12.8 oz (97.4 kg)  Height: 6\' 2"  (1.88 m)   Physical Exam  Constitutional: He is oriented to person, place, and time. He appears well-developed and well-nourished. No distress.  HENT:  Head: Normocephalic and atraumatic.  Nose: Nose normal.  Mouth/Throat: Oropharynx is clear and moist. No oropharyngeal exudate.  Eyes: Pupils are equal, round, and reactive to light. Conjunctivae and EOM are normal.  Neck: Normal range of motion. Neck supple. No JVD present. Carotid bruit is not present. No tracheal deviation present. No thyromegaly present.  Cardiovascular: Normal rate, regular rhythm and normal heart sounds. Exam reveals no gallop and no friction rub.  No murmur heard. Pulmonary/Chest: Effort normal and breath sounds normal. No respiratory distress. He has no wheezes. He has no rales. He exhibits no tenderness.  Abdominal: Soft. Bowel sounds are normal. There is no tenderness.  Musculoskeletal: Normal range of motion.  Lymphadenopathy:    He has no cervical adenopathy.  Neurological: He is alert and oriented to person, place, and time. No cranial nerve deficit.  Skin: Skin is warm and dry. Capillary refill takes less than 2 seconds. He is not diaphoretic.  Psychiatric: He has a normal mood and affect. His behavior is normal. Judgment and thought content normal.  Nursing note and vitals reviewed.  Assessment/Plan: 1. Uncontrolled type 2 diabetes mellitus with hyperglycemia (HCC) - POCT HgB A1C 7.3 today. Continue toujeo at 30units every day and victoza as prescribed.  - TOUJEO SOLOSTAR 300 UNIT/ML SOPN; Inject 30 Units into the skin daily.  Dispense: 6 pen; Refill: 3  2. Essential hypertension Stable. Continue bp medication as prescribed.   3. Erectile dysfunction, unspecified erectile dysfunction type -  sildenafil (VIAGRA) 100 MG tablet; Take 1 tablet (100 mg total) by mouth daily as needed for erectile dysfunction.  Dispense: 10 tablet; Refill: 5  General Counseling: Joesiah verbalizes understanding of the findings of todays visit and agrees with plan of treatment. I have discussed any further diagnostic evaluation that may be needed or ordered today. We also reviewed his medications today. he has been encouraged to call the office with any questions or concerns that should arise related to todays visit.  Diabetes Counseling:  1. Addition of ACE inh/ ARB'S for nephroprotection. Microalbumin is updated  2. Diabetic foot care, prevention of complications. Podiatry consult 3. Exercise and lose weight.  4. Diabetic eye examination, Diabetic eye exam is updated  5. Monitor blood sugar closlely. nutrition counseling.  6. Sign and symptoms of hypoglycemia including shaking sweating,confusion and headaches.   This patient was seen by Vincent GrosHeather Dezra Mandella FNP Collaboration with Dr Lyndon CodeFozia M Khan as a part of collaborative care agreement  Orders Placed This Encounter  Procedures  . POCT HgB A1C    Meds ordered this encounter  Medications  . TOUJEO SOLOSTAR 300 UNIT/ML SOPN    Sig: Inject 30 Units into the skin daily.    Dispense:  6 pen    Refill:  3    Order Specific Question:   Supervising Provider    Answer:   Lyndon CodeKHAN, FOZIA M [[id number]]  . sildenafil (VIAGRA) 100 MG tablet    Sig: Take 1 tablet (100 mg total) by mouth daily as needed for erectile dysfunction.    Dispense:  10 tablet    Refill:  5    Order Specific Question:   Supervising Provider    Answer:   Lyndon CodeKHAN, FOZIA M [[id number]]    Time spent: 1515 Minutes      Dr Lyndon CodeFozia M Khan Internal medicine

## 2018-03-07 ENCOUNTER — Other Ambulatory Visit: Payer: Self-pay

## 2018-03-07 MED ORDER — LIRAGLUTIDE 18 MG/3ML ~~LOC~~ SOPN: 1.8000 mg | PEN_INJECTOR | Freq: Every day | SUBCUTANEOUS | 5 refills | Status: DC

## 2018-03-22 ENCOUNTER — Telehealth: Payer: Self-pay | Admitting: Nurse Practitioner

## 2018-03-22 NOTE — Telephone Encounter (Signed)
Faxed over notes and demographics to [facility] for 3 in cmmde post  Stroke, gait abnormality

## 2018-05-15 ENCOUNTER — Other Ambulatory Visit: Payer: Self-pay | Admitting: Nurse Practitioner

## 2018-05-31 ENCOUNTER — Other Ambulatory Visit: Payer: Self-pay | Admitting: Internal Medicine

## 2018-06-03 ENCOUNTER — Other Ambulatory Visit: Payer: Self-pay | Admitting: Nurse Practitioner

## 2018-06-04 LAB — COMPREHENSIVE METABOLIC PANEL
A/G RATIO: 1.7 (ref 1.2–2.2)
ALK PHOS: 131 IU/L — AB (ref 39–117)
ALT: 36 IU/L (ref 0–44)
AST: 29 IU/L (ref 0–40)
Albumin: 4.7 g/dL (ref 3.6–4.8)
BILIRUBIN TOTAL: 0.3 mg/dL (ref 0.0–1.2)
BUN/Creatinine Ratio: 10 (ref 10–24)
BUN: 13 mg/dL (ref 8–27)
CO2: 26 mmol/L (ref 20–29)
Calcium: 9.4 mg/dL (ref 8.6–10.2)
Chloride: 100 mmol/L (ref 96–106)
Creatinine, Ser: 1.29 mg/dL — ABNORMAL HIGH (ref 0.76–1.27)
GFR calc non Af Amer: 57 mL/min/{1.73_m2} — ABNORMAL LOW (ref >59)
GFR, EST AFRICAN AMERICAN: 66 mL/min/{1.73_m2} (ref >59)
GLUCOSE: 103 mg/dL — AB (ref 65–99)
Globulin, Total: 2.7 g/dL (ref 1.5–4.5)
POTASSIUM: 3.8 mmol/L (ref 3.5–5.2)
Sodium: 142 mmol/L (ref 134–144)
TOTAL PROTEIN: 7.4 g/dL (ref 6.0–8.5)

## 2018-06-04 LAB — TSH: TSH: 1.71 u[IU]/mL (ref 0.450–4.500)

## 2018-06-04 LAB — T4, FREE: Free T4: 1.06 ng/dL (ref 0.82–1.77)

## 2018-06-04 LAB — LIPID PANEL W/O CHOL/HDL RATIO
CHOLESTEROL TOTAL: 147 mg/dL (ref 100–199)
HDL: 67 mg/dL (ref >39)
LDL CALC: 62 mg/dL (ref 0–99)
TRIGLYCERIDES: 89 mg/dL (ref 0–149)
VLDL Cholesterol Cal: 18 mg/dL (ref 5–40)

## 2018-06-04 LAB — CBC
HEMOGLOBIN: 14.3 g/dL (ref 13.0–17.7)
Hematocrit: 43.6 % (ref 37.5–51.0)
MCH: 28.1 pg (ref 26.6–33.0)
MCHC: 32.8 g/dL (ref 31.5–35.7)
MCV: 86 fL (ref 79–97)
Platelets: 218 10*3/uL (ref 150–450)
RBC: 5.09 x10E6/uL (ref 4.14–5.80)
RDW: 12.7 % (ref 12.3–15.4)
WBC: 5.2 10*3/uL (ref 3.4–10.8)

## 2018-06-04 LAB — PSA: Prostate Specific Ag, Serum: 1.1 ng/mL (ref 0.0–4.0)

## 2018-06-07 ENCOUNTER — Encounter: Payer: Self-pay | Admitting: Nurse Practitioner

## 2018-06-07 ENCOUNTER — Ambulatory Visit (INDEPENDENT_AMBULATORY_CARE_PROVIDER_SITE_OTHER): Payer: BLUE CROSS/BLUE SHIELD | Admitting: Nurse Practitioner

## 2018-06-07 VITALS — BP 168/88 | HR 70 | Resp 16 | Ht 74.0 in | Wt 219.4 lb

## 2018-06-07 DIAGNOSIS — Z0001 Encounter for general adult medical examination with abnormal findings: Secondary | ICD-10-CM | POA: Diagnosis not present

## 2018-06-07 DIAGNOSIS — E1165 Type 2 diabetes mellitus with hyperglycemia: Secondary | ICD-10-CM | POA: Diagnosis not present

## 2018-06-07 DIAGNOSIS — N529 Male erectile dysfunction, unspecified: Secondary | ICD-10-CM | POA: Diagnosis not present

## 2018-06-07 DIAGNOSIS — J0140 Acute pansinusitis, unspecified: Secondary | ICD-10-CM | POA: Diagnosis not present

## 2018-06-07 DIAGNOSIS — R300 Dysuria: Secondary | ICD-10-CM

## 2018-06-07 LAB — POCT GLYCOSYLATED HEMOGLOBIN (HGB A1C): Hemoglobin A1C: 8 % — AB (ref 4.0–5.6)

## 2018-06-07 MED ORDER — TOUJEO SOLOSTAR 300 UNIT/ML ~~LOC~~ SOPN: 35.0000 [IU] | PEN_INJECTOR | Freq: Every day | SUBCUTANEOUS | 3 refills | Status: DC

## 2018-06-07 MED ORDER — SILDENAFIL CITRATE 100 MG PO TABS: 100.0000 mg | ORAL_TABLET | Freq: Every day | ORAL | 5 refills | Status: DC | PRN

## 2018-06-07 MED ORDER — AZITHROMYCIN 250 MG PO TABS: ORAL_TABLET | ORAL | 0 refills | Status: DC

## 2018-06-07 NOTE — Progress Notes (Signed)
[facility] [address] [place], [place] [address]  Internal MEDICINE  Office Visit Note  Patient Name: Gabriel Burton  [id number]  [id number]  Date of Service: 06/09/2018  Chief Complaint  Patient presents with  . Annual Exam  . Diabetes  . Hypertension  . Quality Metric Gaps    pt will get flu vaccine from pharmacy and has an Ophthalmology exam scheduled for next month    The patient is here for routine health maintenance exam. He has congestion, headache, scratchy throat, and cough. Has been going on for past few days. Denies fever, chills, or body aches. Did take OTC mecuinex some. clearted up some, but symptoms started happening again.  Blood sugars elevated recently. Taking toujeo 30 units every day and victoza 1.8mg  every day. Admits to giving in to cravings for sweets from time to time. Blood pressure is also slightly elevated. Took bp medication right before leaving the house for this appointment.  Will get flu shot from the pharmacy. Has eye exam scheduled for 06/2018.       Current Medication: Outpatient Encounter Medications as of 06/07/2018  Medication Sig  . amLODipine (NORVASC) 10 MG tablet Take 1 tablet (10 mg total) by mouth daily.  Marland Kitchen aspirin EC 81 MG tablet Take 81 mg by mouth.  Marland Kitchen atorvastatin (LIPITOR) 10 MG tablet TAKE 1 TABLET BY MOUTH AT BEDTIME  . cefUROXime (CEFTIN) 500 MG tablet Take 1 tablet (500 mg total) by mouth 2 (two) times daily with a meal.  . Cholecalciferol (VITAMIN D3) 5000 units TABS Take by mouth.  Marland Kitchen CINNAMON PO Take by mouth.  . Diphenhyd-Hydrocort-Nystatin (FIRST-DUKES MOUTHWASH) SUSP Swish and sallow QID prn sore throat.  . fluticasone (FLONASE) 50 MCG/ACT nasal spray Place 2 sprays into both nostrils daily.  Marland Kitchen gabapentin (NEURONTIN) 100 MG capsule Take 1 capsule (100 mg total) by mouth daily.  . Insulin Pen Needle (BD PEN NEEDLE NANO U/F) 32G X 4 MM MISC USE AS DIRECTED  TWICE  A DAILY DIAG E11.65  . liraglutide  (VICTOZA) 18 MG/3ML SOPN Inject 0.3 mLs (1.8 mg total) into the skin daily.  Marland Kitchen lisinopril-hydrochlorothiazide (PRINZIDE,ZESTORETIC) 20-12.5 MG tablet Take 2 tab by po once a daily for hypertension  . pantoprazole (PROTONIX) 40 MG tablet Take 1 tablet (40 mg total) by mouth daily.  . sildenafil (VIAGRA) 100 MG tablet Take 1 tablet (100 mg total) by mouth daily as needed for erectile dysfunction.  . tamsulosin (FLOMAX) 0.4 MG CAPS capsule TAKE 1 CAPSULE BY MOUTH ONE-HALF HOUR AFTER THE SAME MEAL EACH DAY  . TOUJEO SOLOSTAR 300 UNIT/ML SOPN Inject 35 Units into the skin daily.  . traMADol (ULTRAM) 50 MG tablet Take 50 mg by mouth.  . vitamin C (ASCORBIC ACID) 500 MG tablet Take 500 mg by mouth.  . [DISCONTINUED] sildenafil (VIAGRA) 100 MG tablet Take 1 tablet (100 mg total) by mouth daily as needed for erectile dysfunction.  . [DISCONTINUED] TOUJEO SOLOSTAR 300 UNIT/ML SOPN Inject 30 Units into the skin daily.  Marland Kitchen azithromycin (ZITHROMAX) 250 MG tablet z-pack - take as directed for 5 days  . [DISCONTINUED] azithromycin (ZITHROMAX) 250 MG tablet z-pack - take as directed for 5 days (Patient not taking: Reported on 06/07/2018)   No facility-administered encounter medications on file as of 06/07/2018.     Surgical History: History reviewed. No pertinent surgical history.  Medical History: Past Medical History:  Diagnosis Date  . Diabetes mellitus without complication (HCC)   . Hypertension  Family History: History reviewed. No pertinent family history.  Social History   Socioeconomic History  . Marital status: Widowed    Spouse name: Not on file  . Number of children: Not on file  . Years of education: Not on file  . Highest education level: Not on file  Occupational History  . Not on file  Social Needs  . Financial resource strain: Not on file  . Food insecurity:    Worry: Not on file    Inability: Not on file  . Transportation needs:    Medical: Not on file    Non-medical:  Not on file  Tobacco Use  . Smoking status: Never Smoker  . Smokeless tobacco: Never Used  Substance and Sexual Activity  . Alcohol use: Never    Frequency: Never  . Drug use: Never  . Sexual activity: Not on file  Lifestyle  . Physical activity:    Days per week: Not on file    Minutes per session: Not on file  . Stress: Not on file  Relationships  . Social connections:    Talks on phone: Not on file    Gets together: Not on file    Attends religious service: Not on file    Active member of club or organization: Not on file    Attends meetings of clubs or organizations: Not on file    Relationship status: Not on file  . Intimate partner violence:    Fear of current or ex partner: Not on file    Emotionally abused: Not on file    Physically abused: Not on file    Forced sexual activity: Not on file  Other Topics Concern  . Not on file  Social History Narrative  . Not on file      Review of Systems  Constitutional: Negative for activity change, chills, fatigue and unexpected weight change.  HENT: Positive for congestion, postnasal drip, rhinorrhea, sinus pressure and sore throat. Negative for sneezing and voice change.   Eyes: Negative.   Respiratory: Positive for cough. Negative for chest tightness and shortness of breath.   Cardiovascular: Negative for chest pain and palpitations.       Bp mildly elevated this morning.   Gastrointestinal: Negative for abdominal pain, constipation, diarrhea, nausea and vomiting.  Endocrine:       Blood sugars doing well   Genitourinary: Negative.  Negative for dysuria and frequency.  Musculoskeletal: Negative for arthralgias, back pain, joint swelling and neck pain.       Right knee pain/weakness along the inner aspect of right knee  Allergic/Immunologic: Positive for environmental allergies.  Neurological: Negative for tremors and numbness.  Hematological: Negative for adenopathy. Does not bruise/bleed easily.   Psychiatric/Behavioral: Negative for behavioral problems (Depression), sleep disturbance and suicidal ideas. The patient is not nervous/anxious.     Today's Vitals   06/07/18 0941  BP: (!) 168/88  Pulse: 70  Resp: 16  SpO2: 98%  Weight: 219 lb 6.4 oz (99.5 kg)  Height: 6\' 2"  (1.88 m)    Physical Exam  Constitutional: He is oriented to person, place, and time. He appears well-developed and well-nourished. No distress.  HENT:  Head: Normocephalic and atraumatic.  Right Ear: Tympanic membrane is bulging.  Left Ear: Tympanic membrane is bulging.  Nose: Rhinorrhea present. Right sinus exhibits no maxillary sinus tenderness and no frontal sinus tenderness. Left sinus exhibits no maxillary sinus tenderness and no frontal sinus tenderness.  Mouth/Throat: Posterior oropharyngeal erythema present. No oropharyngeal exudate.  Eyes: Pupils are equal, round, and reactive to light. Conjunctivae and EOM are normal.  Neck: Normal range of motion. Neck supple. No JVD present. Carotid bruit is not present. No tracheal deviation present. No thyromegaly present.  Cardiovascular: Normal rate, regular rhythm, normal heart sounds and intact distal pulses. Exam reveals no gallop and no friction rub.  No murmur heard. Pulses:      Dorsalis pedis pulses are 2+ on the right side, and 2+ on the left side.       Posterior tibial pulses are 2+ on the right side, and 2+ on the left side.  Pulmonary/Chest: Effort normal and breath sounds normal. No respiratory distress. He has no wheezes. He has no rales. He exhibits no tenderness.  Abdominal: Soft. Bowel sounds are normal. There is no tenderness.  Musculoskeletal: Normal range of motion.       Right foot: There is normal range of motion and no deformity.       Left foot: There is normal range of motion and no deformity.  Feet:  Right Foot:  Protective Sensation: 10 sites tested. 10 sites sensed.  Left Foot:  Protective Sensation: 10 sites tested. 10 sites  sensed.  Lymphadenopathy:    He has cervical adenopathy.  Neurological: He is alert and oriented to person, place, and time. No cranial nerve deficit.  Skin: Skin is warm and dry. Capillary refill takes less than 2 seconds. He is not diaphoretic.  Psychiatric: He has a normal mood and affect. His behavior is normal. Judgment and thought content normal.  Nursing note and vitals reviewed.  Assessment/Plan: 1. Encounter for general adult medical examination with abnormal findings Annual health maintenance exam today  2. Acute non-recurrent pansinusitis Start z-pack. Take as directed for 5 days. Rest and increase fluids. Suggested use of OTC medication to help alleviate symptoms.  - azithromycin (ZITHROMAX) 250 MG tablet; z-pack - take as directed for 5 days  Dispense: 6 tablet; Refill: 0  3. Uncontrolled type 2 diabetes mellitus with hyperglycemia (HCC) - POCT HgB A1C 8.0. Will have him increase dose of toujeo to 35units daily. Verbal instructions provided for titration. Continue other diabetic medication as prescribed  - TOUJEO SOLOSTAR 300 UNIT/ML SOPN; Inject 35 Units into the skin daily.  Dispense: 6 pen; Refill: 3  4. Erectile dysfunction, unspecified erectile dysfunction type - sildenafil (VIAGRA) 100 MG tablet; Take 1 tablet (100 mg total) by mouth daily as needed for erectile dysfunction.  Dispense: 10 tablet; Refill: 5  5. Dysuria - UA/M w/rflx Culture, Routine  General Counseling: Seiji verbalizes understanding of the findings of todays visit and agrees with plan of treatment. I have discussed any further diagnostic evaluation that may be needed or ordered today. We also reviewed his medications today. he has been encouraged to call the office with any questions or concerns that should arise related to todays visit.  Diabetes Counseling:  1. Addition of ACE inh/ ARB'S for nephroprotection. Microalbumin is updated  2. Diabetic foot care, prevention of complications. Podiatry  consult 3. Exercise and lose weight.  4. Diabetic eye examination, Diabetic eye exam is updated  5. Monitor blood sugar closlely. nutrition counseling.  6. Sign and symptoms of hypoglycemia including shaking sweating,confusion and headaches.  This patient was seen by Vincent Gros FNP Collaboration with Dr Lyndon Code as a part of collaborative care agreement  Orders Placed This Encounter  Procedures  . Microscopic Examination  . UA/M w/rflx Culture, Routine  . POCT HgB A1C  Meds ordered this encounter  Medications  . TOUJEO SOLOSTAR 300 UNIT/ML SOPN    Sig: Inject 35 Units into the skin daily.    Dispense:  6 pen    Refill:  3    Please note increased dose    Order Specific Question:   Supervising Provider    Answer:   Lyndon CodeKHAN, FOZIA M [[id number]]  . azithromycin (ZITHROMAX) 250 MG tablet    Sig: z-pack - take as directed for 5 days    Dispense:  6 tablet    Refill:  0    Order Specific Question:   Supervising Provider    Answer:   Lyndon CodeKHAN, FOZIA M [[id number]]  . sildenafil (VIAGRA) 100 MG tablet    Sig: Take 1 tablet (100 mg total) by mouth daily as needed for erectile dysfunction.    Dispense:  10 tablet    Refill:  5    Written rx provided to patient today    Order Specific Question:   Supervising Provider    Answer:   Lyndon CodeKHAN, FOZIA M [[id number]]    Time spent: 5030 Minutes      Dr Lyndon CodeFozia M Khan Internal medicine

## 2018-06-08 LAB — UA/M W/RFLX CULTURE, ROUTINE
BILIRUBIN UA: NEGATIVE
GLUCOSE, UA: NEGATIVE
Ketones, UA: NEGATIVE
Leukocytes, UA: NEGATIVE
Nitrite, UA: NEGATIVE
PH UA: 7.5 (ref 5.0–7.5)
PROTEIN UA: NEGATIVE
RBC, UA: NEGATIVE
SPEC GRAV UA: 1.008 (ref 1.005–1.030)
Urobilinogen, Ur: 0.2 mg/dL (ref 0.2–1.0)

## 2018-06-08 LAB — MICROSCOPIC EXAMINATION
BACTERIA UA: NONE SEEN
Casts: NONE SEEN /lpf
RBC, UA: NONE SEEN /hpf (ref 0–2)

## 2018-06-09 DIAGNOSIS — R300 Dysuria: Secondary | ICD-10-CM | POA: Insufficient documentation

## 2018-06-09 DIAGNOSIS — Z0001 Encounter for general adult medical examination with abnormal findings: Secondary | ICD-10-CM | POA: Insufficient documentation

## 2018-06-10 ENCOUNTER — Telehealth: Payer: Self-pay | Admitting: Nurse Practitioner

## 2018-06-10 NOTE — Telephone Encounter (Signed)
Patient notified of received labs and all looks good.

## 2018-07-24 ENCOUNTER — Encounter: Payer: Self-pay | Admitting: Nurse Practitioner

## 2018-08-14 ENCOUNTER — Other Ambulatory Visit: Payer: Self-pay

## 2018-08-14 MED ORDER — TAMSULOSIN HCL 0.4 MG PO CAPS: ORAL_CAPSULE | ORAL | 1 refills | Status: DC

## 2018-08-27 ENCOUNTER — Other Ambulatory Visit: Payer: Self-pay

## 2018-08-27 ENCOUNTER — Telehealth: Payer: Self-pay | Admitting: Adult Health

## 2018-08-27 ENCOUNTER — Ambulatory Visit: Payer: BLUE CROSS/BLUE SHIELD | Admitting: Adult Health

## 2018-08-27 ENCOUNTER — Encounter: Payer: Self-pay | Admitting: Adult Health

## 2018-08-27 VITALS — BP 143/92 | HR 93 | Temp 99.7°F | Ht 73.0 in | Wt 214.0 lb

## 2018-08-27 DIAGNOSIS — J069 Acute upper respiratory infection, unspecified: Secondary | ICD-10-CM

## 2018-08-27 DIAGNOSIS — R05 Cough: Secondary | ICD-10-CM

## 2018-08-27 DIAGNOSIS — J029 Acute pharyngitis, unspecified: Secondary | ICD-10-CM | POA: Diagnosis not present

## 2018-08-27 DIAGNOSIS — R059 Cough, unspecified: Secondary | ICD-10-CM

## 2018-08-27 LAB — POCT INFLUENZA A/B
Influenza A, POC: NEGATIVE
Influenza B, POC: NEGATIVE

## 2018-08-27 MED ORDER — HYDROCOD POLST-CPM POLST ER 10-8 MG/5ML PO SUER: 5.0000 mL | Freq: Two times a day (BID) | ORAL | 0 refills | Status: DC | PRN

## 2018-08-27 MED ORDER — LEVOFLOXACIN 500 MG PO TABS: 500.0000 mg | ORAL_TABLET | Freq: Every day | ORAL | 0 refills | Status: DC

## 2018-08-27 MED ORDER — PHENYLEPH-CHLORPHEN-CODEINE 5-2-10 MG/5ML PO SYRP: 5.0000 mL | ORAL_SOLUTION | Freq: Every evening | ORAL | 0 refills | Status: DC | PRN

## 2018-08-27 NOTE — Telephone Encounter (Signed)
Medication that was sent in for cough syrup , [facility] is not able to get , please call in something different

## 2018-08-27 NOTE — Progress Notes (Signed)
[facility] [address] [place], [place] [address]  Internal MEDICINE  Office Visit Note  Patient Name: Gabriel Burton  [id number]  [id number]  Date of Service: 10/16/2018  Chief Complaint  Patient presents with  . Sore Throat    started three days ago   . Generalized Body Aches  . Fever     HPI Pt is here for a sick visit. A few days of sore throat, cough, generalized body aches and fever.  He is not producing anything when he coughs.  He states "It feels like I may have walking pneumonia again." He reports some dizziness, and chest congestions as well.      Current Medication:  Outpatient Encounter Medications as of 08/27/2018  Medication Sig Note  . amLODipine (NORVASC) 10 MG tablet Take 1 tablet (10 mg total) by mouth daily.   Marland Kitchen aspirin EC 81 MG tablet Take 81 mg by mouth.   Marland Kitchen atorvastatin (LIPITOR) 10 MG tablet TAKE 1 TABLET BY MOUTH AT BEDTIME   . Cholecalciferol (VITAMIN D3) 5000 units TABS Take by mouth.   Marland Kitchen CINNAMON PO Take by mouth.   . fluticasone (FLONASE) 50 MCG/ACT nasal spray Place 2 sprays into both nostrils daily.   Marland Kitchen gabapentin (NEURONTIN) 100 MG capsule Take 1 capsule (100 mg total) by mouth daily.   . Insulin Pen Needle (BD PEN NEEDLE NANO U/F) 32G X 4 MM MISC USE AS DIRECTED  TWICE  A DAILY DIAG E11.65   . lisinopril-hydrochlorothiazide (PRINZIDE,ZESTORETIC) 20-12.5 MG tablet Take 2 tab by po once a daily for hypertension   . pantoprazole (PROTONIX) 40 MG tablet Take 1 tablet (40 mg total) by mouth daily.   . tamsulosin (FLOMAX) 0.4 MG CAPS capsule TAKE 1 CAPSULE BY MOUTH ONE-HALF HOUR AFTER THE SAME MEAL EACH DAY   . TOUJEO SOLOSTAR 300 UNIT/ML SOPN Inject 35 Units into the skin daily.   . vitamin C (ASCORBIC ACID) 500 MG tablet Take 500 mg by mouth.   . [DISCONTINUED] liraglutide (VICTOZA) 18 MG/3ML SOPN Inject 0.3 mLs (1.8 mg total) into the skin daily.   . [DISCONTINUED] sildenafil (VIAGRA) 100 MG tablet Take 1 tablet (100 mg total) by  mouth daily as needed for erectile dysfunction. 10/08/2018: viagra causing headache   . [DISCONTINUED] traMADol (ULTRAM) 50 MG tablet Take 50 mg by mouth.   . [DISCONTINUED] azithromycin (ZITHROMAX) 250 MG tablet z-pack - take as directed for 5 days (Patient not taking: Reported on 08/27/2018)   . [DISCONTINUED] cefUROXime (CEFTIN) 500 MG tablet Take 1 tablet (500 mg total) by mouth 2 (two) times daily with a meal. (Patient not taking: Reported on 08/27/2018)   . [DISCONTINUED] chlorpheniramine-HYDROcodone (TUSSIONEX PENNKINETIC ER) 10-8 MG/5ML SUER Take 5 mLs by mouth every 12 (twelve) hours as needed for cough.   . [DISCONTINUED] Diphenhyd-Hydrocort-Nystatin (FIRST-DUKES MOUTHWASH) SUSP Swish and sallow QID prn sore throat. (Patient not taking: Reported on 08/27/2018)   . [DISCONTINUED] levofloxacin (LEVAQUIN) 500 MG tablet Take 1 tablet (500 mg total) by mouth daily.   . [DISCONTINUED] Phenyleph-Chlorphen-Codeine 11-15-08 MG/5ML SYRP Take 5 mLs by mouth at bedtime and may repeat dose one time if needed.    No facility-administered encounter medications on file as of 08/27/2018.       Medical History: Past Medical History:  Diagnosis Date  . Diabetes mellitus without complication (HCC)   . Hypertension      Vital Signs: BP (!) 143/92   Pulse 93   Temp 99.7 F (37.6 C)  Ht 6\' 1"  (1.854 m)   Wt 214 lb (97.1 kg)   SpO2 94%   BMI 28.23 kg/m    Review of Systems  Constitutional: Positive for fatigue and fever.  HENT: Positive for sore throat.   Respiratory: Positive for cough, chest tightness and shortness of breath.     Physical Exam Vitals signs and nursing note reviewed.  Constitutional:      General: He is not in acute distress.    Appearance: He is well-developed. He is not diaphoretic.  HENT:     Head: Normocephalic and atraumatic.     Mouth/Throat:     Pharynx: No oropharyngeal exudate.  Eyes:     Pupils: Pupils are equal, round, and reactive to light.  Neck:      Musculoskeletal: Normal range of motion and neck supple.     Thyroid: No thyromegaly.     Vascular: No JVD.     Trachea: No tracheal deviation.  Cardiovascular:     Rate and Rhythm: Normal rate and regular rhythm.     Heart sounds: Normal heart sounds. No murmur. No friction rub. No gallop.   Pulmonary:     Effort: Pulmonary effort is normal. No respiratory distress.     Breath sounds: Examination of the right-lower field reveals decreased breath sounds and rhonchi. Examination of the left-lower field reveals decreased breath sounds. Decreased breath sounds and rhonchi present. No wheezing or rales.  Chest:     Chest wall: No tenderness.  Abdominal:     Palpations: Abdomen is soft.     Tenderness: There is no abdominal tenderness. There is no guarding.  Musculoskeletal: Normal range of motion.  Lymphadenopathy:     Cervical: No cervical adenopathy.  Skin:    General: Skin is warm and dry.  Neurological:     Mental Status: He is alert and oriented to person, place, and time.     Cranial Nerves: No cranial nerve deficit.  Psychiatric:        Behavior: Behavior normal.        Thought Content: Thought content normal.        Judgment: Judgment normal.     Assessment/Plan: 1. Upper respiratory tract infection, unspecified type 10-day course of Levaquin 500 g p.o. daily was prescribed for patient. Advised patient to take entire course of antibiotics as prescribed with food. Pt should return to clinic in 7-10 days if symptoms fail to improve or new symptoms develop.   2. Cough Patient provided with prescription for Tussionex cough syrup to help him sleep at night. Reviewed risks and possible side effects associated with taking opiates, benzodiazepines and other CNS depressants. Combination of these could cause dizziness and drowsiness. Advised patient not to drive or operate machinery when taking these medications, as patient's and other's life can be at risk and will have consequences.  Patient verbalized understanding in this matter. Dependence and abuse for these drugs will be monitored closely. A Controlled substance policy and procedure is on file which allows [facility] to order a urine drug screen test at any visit. Patient understands and agrees with the plan  3. Sore throat Flu test negative.  - POCT Influenza A/B  General Counseling: Burel verbalizes understanding of the findings of todays visit and agrees with plan of treatment. I have discussed any further diagnostic evaluation that may be needed or ordered today. We also reviewed his medications today. he has been encouraged to call the office with any questions or concerns that should  arise related to todays visit.   Orders Placed This Encounter  Procedures  . POCT Influenza A/B    Meds ordered this encounter  Medications  . DISCONTD: Phenyleph-Chlorphen-Codeine 11-15-08 MG/5ML SYRP    Sig: Take 5 mLs by mouth at bedtime and may repeat dose one time if needed.    Dispense:  120 mL    Refill:  0  . DISCONTD: levofloxacin (LEVAQUIN) 500 MG tablet    Sig: Take 1 tablet (500 mg total) by mouth daily.    Dispense:  10 tablet    Refill:  0  . DISCONTD: chlorpheniramine-HYDROcodone (TUSSIONEX PENNKINETIC ER) 10-8 MG/5ML SUER    Sig: Take 5 mLs by mouth every 12 (twelve) hours as needed for cough.    Dispense:  140 mL    Refill:  0    Time spent: 25 Minutes  This patient was seen by Blima LedgerAdam Addilee Neu AGNP-C in Collaboration with Dr Lyndon CodeFozia M Khan as a part of collaborative care agreement.  Johnna AcostaAdam J. Eufemio Strahm AGNP-C Internal Medicine

## 2018-09-09 ENCOUNTER — Ambulatory Visit: Payer: Self-pay | Admitting: Nurse Practitioner

## 2018-09-12 ENCOUNTER — Encounter: Payer: Self-pay | Admitting: Nurse Practitioner

## 2018-09-12 ENCOUNTER — Ambulatory Visit: Payer: BLUE CROSS/BLUE SHIELD | Admitting: Nurse Practitioner

## 2018-09-12 VITALS — BP 158/84 | HR 78 | Resp 16 | Ht 74.0 in | Wt 212.6 lb

## 2018-09-12 DIAGNOSIS — E1165 Type 2 diabetes mellitus with hyperglycemia: Secondary | ICD-10-CM

## 2018-09-12 DIAGNOSIS — R9431 Abnormal electrocardiogram [ECG] [EKG]: Secondary | ICD-10-CM | POA: Diagnosis not present

## 2018-09-12 DIAGNOSIS — R05 Cough: Secondary | ICD-10-CM

## 2018-09-12 DIAGNOSIS — R059 Cough, unspecified: Secondary | ICD-10-CM

## 2018-09-12 DIAGNOSIS — J069 Acute upper respiratory infection, unspecified: Secondary | ICD-10-CM

## 2018-09-12 DIAGNOSIS — R079 Chest pain, unspecified: Secondary | ICD-10-CM | POA: Diagnosis not present

## 2018-09-12 LAB — POCT GLYCOSYLATED HEMOGLOBIN (HGB A1C): Hemoglobin A1C: 7.6 % — AB (ref 4.0–5.6)

## 2018-09-12 MED ORDER — CEFUROXIME AXETIL 500 MG PO TABS: 500.0000 mg | ORAL_TABLET | Freq: Two times a day (BID) | ORAL | 0 refills | Status: DC

## 2018-09-12 MED ORDER — HYDROCOD POLST-CPM POLST ER 10-8 MG/5ML PO SUER: 5.0000 mL | Freq: Two times a day (BID) | ORAL | 0 refills | Status: DC | PRN

## 2018-09-12 NOTE — Progress Notes (Signed)
[facility] [address] [place], [place] [address]  Internal MEDICINE  Office Visit Note  Patient Name: Gabriel Burton  [id number]  [id number]  Date of Service: 09/22/2018  Chief Complaint  Patient presents with  . Medical Management of Chronic Issues    3 month follow up  . Diabetes    A1C  . Hypertension    The patient is complaining of chest congestion and tightness as well as cough, nasal congestion and sore throat. Recently treated for upper respiratory infection and cough. Has completed round of treatment with levofloxacin and cough suppressant. Feels some better but continues to have chest tightness.  Blood sugars have improved. HgbA1c has improved to 7.6 today. Blood pressure is slightly elevated.       Current Medication: Outpatient Encounter Medications as of 09/12/2018  Medication Sig  . amLODipine (NORVASC) 10 MG tablet Take 1 tablet (10 mg total) by mouth daily.  Marland Kitchen aspirin EC 81 MG tablet Take 81 mg by mouth.  Marland Kitchen atorvastatin (LIPITOR) 10 MG tablet TAKE 1 TABLET BY MOUTH AT BEDTIME  . chlorpheniramine-HYDROcodone (TUSSIONEX PENNKINETIC ER) 10-8 MG/5ML SUER Take 5 mLs by mouth every 12 (twelve) hours as needed for cough.  . Cholecalciferol (VITAMIN D3) 5000 units TABS Take by mouth.  Marland Kitchen CINNAMON PO Take by mouth.  . fluticasone (FLONASE) 50 MCG/ACT nasal spray Place 2 sprays into both nostrils daily.  Marland Kitchen gabapentin (NEURONTIN) 100 MG capsule Take 1 capsule (100 mg total) by mouth daily.  . Insulin Pen Needle (BD PEN NEEDLE NANO U/F) 32G X 4 MM MISC USE AS DIRECTED  TWICE  A DAILY DIAG E11.65  . levofloxacin (LEVAQUIN) 500 MG tablet Take 1 tablet (500 mg total) by mouth daily.  Marland Kitchen liraglutide (VICTOZA) 18 MG/3ML SOPN Inject 0.3 mLs (1.8 mg total) into the skin daily.  Marland Kitchen lisinopril-hydrochlorothiazide (PRINZIDE,ZESTORETIC) 20-12.5 MG tablet Take 2 tab by po once a daily for hypertension  . pantoprazole (PROTONIX) 40 MG tablet Take 1 tablet (40 mg total) by  mouth daily.  . sildenafil (VIAGRA) 100 MG tablet Take 1 tablet (100 mg total) by mouth daily as needed for erectile dysfunction.  . tamsulosin (FLOMAX) 0.4 MG CAPS capsule TAKE 1 CAPSULE BY MOUTH ONE-HALF HOUR AFTER THE SAME MEAL EACH DAY  . TOUJEO SOLOSTAR 300 UNIT/ML SOPN Inject 35 Units into the skin daily.  . traMADol (ULTRAM) 50 MG tablet Take 50 mg by mouth.  . vitamin C (ASCORBIC ACID) 500 MG tablet Take 500 mg by mouth.  . [DISCONTINUED] chlorpheniramine-HYDROcodone (TUSSIONEX PENNKINETIC ER) 10-8 MG/5ML SUER Take 5 mLs by mouth every 12 (twelve) hours as needed for cough.  . cefUROXime (CEFTIN) 500 MG tablet Take 1 tablet (500 mg total) by mouth 2 (two) times daily with a meal.   No facility-administered encounter medications on file as of 09/12/2018.     Surgical History: History reviewed. No pertinent surgical history.  Medical History: Past Medical History:  Diagnosis Date  . Diabetes mellitus without complication (HCC)   . Hypertension     Family History: History reviewed. No pertinent family history.  Social History   Socioeconomic History  . Marital status: Widowed    Spouse name: Not on file  . Number of children: Not on file  . Years of education: Not on file  . Highest education level: Not on file  Occupational History  . Not on file  Social Needs  . Financial resource strain: Not on file  . Food insecurity:  Worry: Not on file    Inability: Not on file  . Transportation needs:    Medical: Not on file    Non-medical: Not on file  Tobacco Use  . Smoking status: Never Smoker  . Smokeless tobacco: Never Used  Substance and Sexual Activity  . Alcohol use: Never    Frequency: Never  . Drug use: Never  . Sexual activity: Not on file  Lifestyle  . Physical activity:    Days per week: Not on file    Minutes per session: Not on file  . Stress: Not on file  Relationships  . Social connections:    Talks on phone: Not on file    Gets together: Not on  file    Attends religious service: Not on file    Active member of club or organization: Not on file    Attends meetings of clubs or organizations: Not on file    Relationship status: Not on file  . Intimate partner violence:    Fear of current or ex partner: Not on file    Emotionally abused: Not on file    Physically abused: Not on file    Forced sexual activity: Not on file  Other Topics Concern  . Not on file  Social History Narrative  . Not on file      Review of Systems  Constitutional: Positive for fatigue. Negative for activity change, chills and unexpected weight change.  HENT: Positive for congestion, postnasal drip, rhinorrhea, sinus pressure and sore throat. Negative for sneezing and voice change.   Respiratory: Positive for cough, chest tightness and shortness of breath.   Cardiovascular: Negative for chest pain and palpitations.       Bp mildly elevated this morning.   Gastrointestinal: Negative for abdominal pain, constipation, diarrhea, nausea and vomiting.  Endocrine: Negative for cold intolerance, heat intolerance, polydipsia and polyuria.       Blood sugars doing well   Musculoskeletal: Negative for arthralgias, back pain, joint swelling and neck pain.  Allergic/Immunologic: Positive for environmental allergies.  Neurological: Negative for tremors and numbness.  Hematological: Negative for adenopathy. Does not bruise/bleed easily.  Psychiatric/Behavioral: Negative for behavioral problems (Depression), sleep disturbance and suicidal ideas. The patient is not nervous/anxious.     Today's Vitals   09/12/18 1129  BP: (!) 158/84  Pulse: 78  Resp: 16  SpO2: 98%  Weight: 212 lb 9.6 oz (96.4 kg)  Height: 6\' 2"  (1.88 m)   Body mass index is 27.3 kg/m.  Physical Exam Vitals signs and nursing note reviewed.  Constitutional:      General: He is not in acute distress.    Appearance: Normal appearance. He is well-developed. He is not diaphoretic.  HENT:      Head: Normocephalic and atraumatic.     Right Ear: Tympanic membrane is bulging.     Left Ear: Tympanic membrane is bulging.     Nose: Rhinorrhea present.     Right Sinus: No maxillary sinus tenderness or frontal sinus tenderness.     Left Sinus: No maxillary sinus tenderness or frontal sinus tenderness.     Mouth/Throat:     Pharynx: Posterior oropharyngeal erythema present. No oropharyngeal exudate.  Eyes:     Conjunctiva/sclera: Conjunctivae normal.     Pupils: Pupils are equal, round, and reactive to light.  Neck:     Musculoskeletal: Normal range of motion and neck supple.     Thyroid: No thyromegaly.     Vascular: No carotid bruit or  JVD.     Trachea: No tracheal deviation.  Cardiovascular:     Rate and Rhythm: Normal rate and regular rhythm.     Heart sounds: Normal heart sounds. No murmur. No friction rub. No gallop.      Comments: ECG showing left bundle branch block with left axis.  Pulmonary:     Effort: Pulmonary effort is normal. No respiratory distress.     Breath sounds: Normal breath sounds. No wheezing or rales.  Chest:     Chest wall: No tenderness.  Abdominal:     General: Bowel sounds are normal.     Palpations: Abdomen is soft.     Tenderness: There is no abdominal tenderness.  Musculoskeletal: Normal range of motion.     Right foot: No deformity.  Lymphadenopathy:     Cervical: Cervical adenopathy present.  Skin:    General: Skin is warm and dry.     Capillary Refill: Capillary refill takes less than 2 seconds.  Neurological:     Mental Status: He is alert and oriented to person, place, and time.     Cranial Nerves: No cranial nerve deficit.  Psychiatric:        Behavior: Behavior normal.        Thought Content: Thought content normal.        Judgment: Judgment normal.   Assessment/Plan: 1. Uncontrolled type 2 diabetes mellitus with hyperglycemia (HCC) - POCT HgB A1C improved from 8.0 to 7.6 today. Continue diabetic medication as prescribed   2.  Chest pain, unspecified type ECG showing left bundle branch block and left axis deviation. Will get stress test and echocardiogram for further evaluation.  - EKG 12-Lead - DG Chest 2 View; Future - ECHOCARDIOGRAM COMPLETE; Future - EXERCISE TOLERANCE TEST (ETT); Future  3. Abnormal ECG ECG showing left bundle branch block and left axis deviation. Will get stress test and echocardiogram for further evaluation - ECHOCARDIOGRAM COMPLETE; Future - EXERCISE TOLERANCE TEST (ETT); Future  4. Upper respiratory tract infection, unspecified type Start ceftin  twice daily for 10 days. Get chest x-ray for further evaluation. - cefUROXime (CEFTIN) 500 MG tablet; Take 1 tablet (500 mg total) by mouth 2 (two) times daily with a meal.  Dispense: 20 tablet; Refill: 0 - DG Chest 2 View; Future  5. Cough Add tussionex cough suppressant. Advised patient not to overuse this medicine and not to mix with other medications or alcohol as it can cause respiratory distress, sleepiness or dizziness. Should also avoid driving. Patient voiced understanding and agreement.  - chlorpheniramine-HYDROcodone (TUSSIONEX PENNKINETIC ER) 10-8 MG/5ML SUER; Take 5 mLs by mouth every 12 (twelve) hours as needed for cough.  Dispense: 140 mL; Refill: 0  General Counseling: Chloe verbalizes understanding of the findings of todays visit and agrees with plan of treatment. I have discussed any further diagnostic evaluation that may be needed or ordered today. We also reviewed his medications today. he has been encouraged to call the office with any questions or concerns that should arise related to todays visit.  Cardiac risk factor modification:  1. Control blood pressure. 2. Exercise as prescribed. 3. Follow low sodium, low fat diet. and low fat and low cholestrol diet. 4. Take ASA  once a day. 5. Restricted calories diet to lose weight.  Diabetes Counseling:  1. Addition of ACE inh/ ARB'S for nephroprotection.  Microalbumin is updated  2. Diabetic foot care, prevention of complications. Podiatry consult 3. Exercise and lose weight.  4. Diabetic eye examination, Diabetic eye exam  is updated  5. Monitor blood sugar closlely. nutrition counseling.  6. Sign and symptoms of hypoglycemia including shaking sweating,confusion and headaches.  This patient was seen by Vincent Gros FNP Collaboration with Dr Lyndon Code as a part of collaborative care agreement  Orders Placed This Encounter  Procedures  . DG Chest 2 View  . EXERCISE TOLERANCE TEST (ETT)  . POCT HgB A1C  . EKG 12-Lead  . ECHOCARDIOGRAM COMPLETE    Meds ordered this encounter  Medications  . cefUROXime (CEFTIN) 500 MG tablet    Sig: Take 1 tablet (500 mg total) by mouth 2 (two) times daily with a meal.    Dispense:  20 tablet    Refill:  0    Order Specific Question:   Supervising Provider    Answer:   Lyndon Code [[id number]]  . chlorpheniramine-HYDROcodone (TUSSIONEX PENNKINETIC ER) 10-8 MG/5ML SUER    Sig: Take 5 mLs by mouth every 12 (twelve) hours as needed for cough.    Dispense:  140 mL    Refill:  0    Order Specific Question:   Supervising Provider    Answer:   Lyndon Code [[id number]]    Time spent: 109 Minutes      Dr Lyndon Code Internal medicine

## 2018-09-20 ENCOUNTER — Other Ambulatory Visit: Payer: Self-pay

## 2018-09-20 ENCOUNTER — Ambulatory Visit: Payer: BLUE CROSS/BLUE SHIELD

## 2018-09-20 DIAGNOSIS — R079 Chest pain, unspecified: Secondary | ICD-10-CM

## 2018-09-20 DIAGNOSIS — R9431 Abnormal electrocardiogram [ECG] [EKG]: Secondary | ICD-10-CM | POA: Diagnosis not present

## 2018-09-22 DIAGNOSIS — R9431 Abnormal electrocardiogram [ECG] [EKG]: Secondary | ICD-10-CM | POA: Insufficient documentation

## 2018-09-22 DIAGNOSIS — R05 Cough: Secondary | ICD-10-CM | POA: Insufficient documentation

## 2018-09-22 DIAGNOSIS — R079 Chest pain, unspecified: Secondary | ICD-10-CM | POA: Insufficient documentation

## 2018-09-22 DIAGNOSIS — R0789 Other chest pain: Secondary | ICD-10-CM

## 2018-09-22 DIAGNOSIS — R059 Cough, unspecified: Secondary | ICD-10-CM | POA: Insufficient documentation

## 2018-09-22 DIAGNOSIS — J069 Acute upper respiratory infection, unspecified: Secondary | ICD-10-CM | POA: Insufficient documentation

## 2018-09-23 ENCOUNTER — Other Ambulatory Visit: Payer: Self-pay

## 2018-09-23 MED ORDER — LIRAGLUTIDE 18 MG/3ML ~~LOC~~ SOPN: 1.8000 mg | PEN_INJECTOR | Freq: Every day | SUBCUTANEOUS | 5 refills | Status: DC

## 2018-09-25 ENCOUNTER — Ambulatory Visit: Admission: RE | Admit: 2018-09-25 | Payer: Self-pay | Source: Ambulatory Visit

## 2018-10-01 ENCOUNTER — Ambulatory Visit: Payer: Self-pay | Admitting: Nurse Practitioner

## 2018-10-02 ENCOUNTER — Other Ambulatory Visit: Payer: Self-pay

## 2018-10-02 ENCOUNTER — Ambulatory Visit
Admission: RE | Admit: 2018-10-02 | Discharge: 2018-10-02 | Disposition: A | Discharge status: Home / Self Care | Payer: BLUE CROSS/BLUE SHIELD | Source: Ambulatory Visit | Attending: Nurse Practitioner | Admitting: Nurse Practitioner

## 2018-10-02 DIAGNOSIS — R9431 Abnormal electrocardiogram [ECG] [EKG]: Secondary | ICD-10-CM | POA: Insufficient documentation

## 2018-10-02 DIAGNOSIS — R079 Chest pain, unspecified: Secondary | ICD-10-CM | POA: Diagnosis not present

## 2018-10-02 LAB — EXERCISE TOLERANCE TEST
CSEPED: 3 min
CSEPEW: 5.5 METS
CSEPHR: 101 %
Exercise duration (sec): 50 s
MPHR: 154 {beats}/min
Peak HR: 157 {beats}/min
Rest HR: 81 {beats}/min

## 2018-10-04 ENCOUNTER — Other Ambulatory Visit: Payer: Self-pay

## 2018-10-08 ENCOUNTER — Encounter: Payer: Self-pay | Admitting: Nurse Practitioner

## 2018-10-08 ENCOUNTER — Telehealth: Payer: Self-pay

## 2018-10-08 ENCOUNTER — Ambulatory Visit
Admission: RE | Admit: 2018-10-08 | Discharge: 2018-10-08 | Disposition: A | Discharge status: Home / Self Care | Payer: BLUE CROSS/BLUE SHIELD | Source: Ambulatory Visit | Attending: Nurse Practitioner | Admitting: Nurse Practitioner

## 2018-10-08 ENCOUNTER — Other Ambulatory Visit: Payer: Self-pay

## 2018-10-08 ENCOUNTER — Ambulatory Visit
Admission: RE | Admit: 2018-10-08 | Discharge: 2018-10-08 | Disposition: A | Discharge status: Home / Self Care | Payer: BLUE CROSS/BLUE SHIELD | Attending: Nurse Practitioner | Admitting: Nurse Practitioner

## 2018-10-08 ENCOUNTER — Ambulatory Visit: Payer: BLUE CROSS/BLUE SHIELD | Admitting: Nurse Practitioner

## 2018-10-08 VITALS — BP 146/86 | HR 79 | Resp 16 | Ht 74.0 in | Wt 214.8 lb

## 2018-10-08 DIAGNOSIS — M25512 Pain in left shoulder: Secondary | ICD-10-CM

## 2018-10-08 DIAGNOSIS — N529 Male erectile dysfunction, unspecified: Secondary | ICD-10-CM

## 2018-10-08 DIAGNOSIS — R9431 Abnormal electrocardiogram [ECG] [EKG]: Secondary | ICD-10-CM | POA: Diagnosis not present

## 2018-10-08 DIAGNOSIS — G8929 Other chronic pain: Secondary | ICD-10-CM

## 2018-10-08 DIAGNOSIS — R9439 Abnormal result of other cardiovascular function study: Secondary | ICD-10-CM | POA: Diagnosis not present

## 2018-10-08 MED ORDER — TADALAFIL 10 MG PO TABS: 10.0000 mg | ORAL_TABLET | Freq: Every day | ORAL | 2 refills | Status: DC | PRN

## 2018-10-08 MED ORDER — TRAMADOL HCL 50 MG PO TABS: 50.0000 mg | ORAL_TABLET | Freq: Four times a day (QID) | ORAL | 0 refills | Status: DC | PRN

## 2018-10-08 NOTE — Telephone Encounter (Signed)
Informed pt of xray results, and informed him that we could sen a referral to an ortho, but pt refused at the moment he stated he would give [place] a call back if he decides to see ortho.

## 2018-10-08 NOTE — Progress Notes (Signed)
[facility] [address] [place], [place] [address]  Internal MEDICINE  Office Visit Note  Patient Name: Gabriel Burton  [id number]  [id number]  Date of Service: 10/08/2018  Chief Complaint  Patient presents with  . Medical Management of Chronic Issues    follow up,   . Labs Only    echo and stress test results  . Pain    pt is still having some shoulder pain from last visit    The patient continues to have left sided shoulder and chest pain. Feels tight in his chest. Seems to hurt more when he is sitting down. ECG done at her last visit was abnormal, showing left bundle branch block. He had echocardiogram done which looked good. Normal LVEF with diastolic dysfunction present. There was also trace mitral and tricuspid regurgitation. Exercise stress test was abnormal. Had to stop due to leg pain. Blood pressure got very high and left bundle branch block was evident. He has not had his chest x-ray done yet .      Current Medication: Outpatient Encounter Medications as of 10/08/2018  Medication Sig Note  . amLODipine (NORVASC) 10 MG tablet Take 1 tablet (10 mg total) by mouth daily.   Marland Kitchen aspirin EC 81 MG tablet Take 81 mg by mouth.   Marland Kitchen atorvastatin (LIPITOR) 10 MG tablet TAKE 1 TABLET BY MOUTH AT BEDTIME   . chlorpheniramine-HYDROcodone (TUSSIONEX PENNKINETIC ER) 10-8 MG/5ML SUER Take 5 mLs by mouth every 12 (twelve) hours as needed for cough.   . Cholecalciferol (VITAMIN D3) 5000 units TABS Take by mouth.   Marland Kitchen CINNAMON PO Take by mouth.   . fluticasone (FLONASE) 50 MCG/ACT nasal spray Place 2 sprays into both nostrils daily.   Marland Kitchen gabapentin (NEURONTIN) 100 MG capsule Take 1 capsule (100 mg total) by mouth daily.   . Insulin Pen Needle (BD PEN NEEDLE NANO U/F) 32G X 4 MM MISC USE AS DIRECTED  TWICE  A DAILY DIAG E11.65   . liraglutide (VICTOZA) 18 MG/3ML SOPN Inject 0.3 mLs (1.8 mg total) into the skin daily.   Marland Kitchen lisinopril-hydrochlorothiazide (PRINZIDE,ZESTORETIC)  20-12.5 MG tablet Take 2 tab by po once a daily for hypertension   . pantoprazole (PROTONIX) 40 MG tablet Take 1 tablet (40 mg total) by mouth daily.   . tamsulosin (FLOMAX) 0.4 MG CAPS capsule TAKE 1 CAPSULE BY MOUTH ONE-HALF HOUR AFTER THE SAME MEAL EACH DAY   . TOUJEO SOLOSTAR 300 UNIT/ML SOPN Inject 35 Units into the skin daily.   . traMADol (ULTRAM) 50 MG tablet Take 1 tablet (50 mg total) by mouth every 6 (six) hours as needed.   . vitamin C (ASCORBIC ACID) 500 MG tablet Take 500 mg by mouth.   . [DISCONTINUED] cefUROXime (CEFTIN) 500 MG tablet Take 1 tablet (500 mg total) by mouth 2 (two) times daily with a meal.   . [DISCONTINUED] levofloxacin (LEVAQUIN) 500 MG tablet Take 1 tablet (500 mg total) by mouth daily.   . [DISCONTINUED] sildenafil (VIAGRA) 100 MG tablet Take 1 tablet (100 mg total) by mouth daily as needed for erectile dysfunction. 10/08/2018: viagra causing headache   . [DISCONTINUED] traMADol (ULTRAM) 50 MG tablet Take 50 mg by mouth.   . tadalafil (CIALIS) 10 MG tablet Take 1 tablet (10 mg total) by mouth daily as needed for erectile dysfunction.    No facility-administered encounter medications on file as of 10/08/2018.     Surgical History: History reviewed. No pertinent surgical history.  Medical History: Past  Medical History:  Diagnosis Date  . Diabetes mellitus without complication (HCC)   . Hypertension     Family History: History reviewed. No pertinent family history.  Social History   Socioeconomic History  . Marital status: Widowed    Spouse name: Not on file  . Number of children: Not on file  . Years of education: Not on file  . Highest education level: Not on file  Occupational History  . Not on file  Social Needs  . Financial resource strain: Not on file  . Food insecurity:    Worry: Not on file    Inability: Not on file  . Transportation needs:    Medical: Not on file    Non-medical: Not on file  Tobacco Use  . Smoking status: Never  Smoker  . Smokeless tobacco: Never Used  Substance and Sexual Activity  . Alcohol use: Never    Frequency: Never  . Drug use: Never  . Sexual activity: Not on file  Lifestyle  . Physical activity:    Days per week: Not on file    Minutes per session: Not on file  . Stress: Not on file  Relationships  . Social connections:    Talks on phone: Not on file    Gets together: Not on file    Attends religious service: Not on file    Active member of club or organization: Not on file    Attends meetings of clubs or organizations: Not on file    Relationship status: Not on file  . Intimate partner violence:    Fear of current or ex partner: Not on file    Emotionally abused: Not on file    Physically abused: Not on file    Forced sexual activity: Not on file  Other Topics Concern  . Not on file  Social History Narrative  . Not on file      Review of Systems  Constitutional: Positive for fatigue. Negative for activity change, chills and unexpected weight change.  HENT: Negative for congestion, postnasal drip, rhinorrhea, sinus pressure, sneezing and voice change.   Respiratory: Positive for chest tightness and shortness of breath. Negative for cough.   Cardiovascular: Positive for chest pain. Negative for palpitations.       Bp mildly elevated this morning.   Gastrointestinal: Negative for abdominal pain, constipation, diarrhea, nausea and vomiting.  Endocrine: Negative for cold intolerance, heat intolerance, polydipsia and polyuria.       Blood sugars doing well   Musculoskeletal: Positive for arthralgias. Negative for back pain, joint swelling and neck pain.       Left shoulder pain which radiates into the arm and hand. Continues to worsen.   Allergic/Immunologic: Positive for environmental allergies.  Neurological: Negative for tremors and numbness.  Hematological: Negative for adenopathy. Does not bruise/bleed easily.  Psychiatric/Behavioral: Negative for behavioral problems  (Depression), sleep disturbance and suicidal ideas. The patient is not nervous/anxious.     Today's Vitals   10/08/18 1033  BP: (!) 146/86  Pulse: 79  Resp: 16  SpO2: 98%  Weight: 214 lb 12.8 oz (97.4 kg)  Height: 6\' 2"  (1.88 m)   Body mass index is 27.58 kg/m.  Physical Exam Vitals signs and nursing note reviewed.  Constitutional:      General: He is not in acute distress.    Appearance: Normal appearance. He is well-developed. He is not diaphoretic.  HENT:     Head: Normocephalic and atraumatic.     Right Ear:  Tympanic membrane is bulging.     Left Ear: Tympanic membrane is bulging.     Nose:     Right Sinus: No maxillary sinus tenderness or frontal sinus tenderness.     Left Sinus: No maxillary sinus tenderness or frontal sinus tenderness.     Mouth/Throat:     Pharynx: No oropharyngeal exudate.  Eyes:     Conjunctiva/sclera: Conjunctivae normal.     Pupils: Pupils are equal, round, and reactive to light.  Neck:     Musculoskeletal: Normal range of motion and neck supple.     Thyroid: No thyromegaly.     Vascular: No carotid bruit or JVD.     Trachea: No tracheal deviation.  Cardiovascular:     Rate and Rhythm: Normal rate and regular rhythm.     Heart sounds: Normal heart sounds. No murmur. No friction rub. No gallop.      Comments: Heart rhythm mildly irregular today.  Pulmonary:     Effort: Pulmonary effort is normal. No respiratory distress.     Breath sounds: Normal breath sounds. No wheezing or rales.  Chest:     Chest wall: No tenderness.  Abdominal:     General: Bowel sounds are normal.     Palpations: Abdomen is soft.     Tenderness: There is no abdominal tenderness.  Musculoskeletal: Normal range of motion.     Right foot: No deformity.     Comments: moderate left shoulder pain, especially along the [facility] joint area. No swelling or deformity present. There is reduced ROM.   Skin:    General: Skin is warm and dry.     Capillary Refill: Capillary refill  takes less than 2 seconds.  Neurological:     Mental Status: He is alert and oriented to person, place, and time.     Cranial Nerves: No cranial nerve deficit.  Psychiatric:        Behavior: Behavior normal.        Thought Content: Thought content normal.        Judgment: Judgment normal.   Assessment/Plan: 1. Abnormal ECG Reviewed results of stress test and echo with the patient. Abnormal stress test results. Refer to cardiology for further evaluation and treatment.  - Ambulatory referral to Cardiology  2. Abnormal stress test Reviewed results of stress test and echo with the patient. Abnormal stress test results. Refer to cardiology for further evaluation and treatment.  - Ambulatory referral to Cardiology  3. Chronic left shoulder pain Short term prescription for tramadol given. Advised patient to use only when at home and not at work. Reviewed risk factors and side effects assoicated with taking narcotic pain relievers. Will get x-ray of left shoulder for further evaluation. Refer to orthopedics as indicated.  - DG Shoulder Left; Future - traMADol (ULTRAM) 50 MG tablet; Take 1 tablet (50 mg total) by mouth every 6 (six) hours as needed.  Dispense: 30 tablet; Refill: 0  4. Erectile dysfunction, unspecified erectile dysfunction type viagra causing him to have headache every time he took it. Changed to cialis  as needed  - tadalafil (CIALIS) 10 MG tablet; Take 1 tablet (10 mg total) by mouth daily as needed for erectile dysfunction.  Dispense: 10 tablet; Refill: 2  General Counseling: Gabriel Burton verbalizes understanding of the findings of todays visit and agrees with plan of treatment. I have discussed any further diagnostic evaluation that may be needed or ordered today. We also reviewed his medications today. he has been encouraged to call the  office with any questions or concerns that should arise related to todays visit.  Cardiac risk factor modification:  1. Control blood  pressure. 2. Exercise as prescribed. 3. Follow low sodium, low fat diet. and low fat and low cholestrol diet. 4. Take ASA 81mg  once a day. 5. Restricted calories diet to lose weight.  This patient was seen by Vincent Gros FNP Collaboration with Dr Lyndon Code as a part of collaborative care agreement  Orders Placed This Encounter  Procedures  . DG Shoulder Left  . Ambulatory referral to Cardiology    Meds ordered this encounter  Medications  . traMADol (ULTRAM) 50 MG tablet    Sig: Take 1 tablet (50 mg total) by mouth every 6 (six) hours as needed.    Dispense:  30 tablet    Refill:  0    Order Specific Question:   Supervising Provider    Answer:   Lyndon Code [[id number]]  . tadalafil (CIALIS) 10 MG tablet    Sig: Take 1 tablet (10 mg total) by mouth daily as needed for erectile dysfunction.    Dispense:  10 tablet    Refill:  2    Order Specific Question:   Supervising Provider    Answer:   Lyndon Code [[id number]]    Time spent: 46 Minutes      Dr Lyndon Code Internal medicine

## 2018-10-16 ENCOUNTER — Encounter: Payer: Self-pay | Admitting: Adult Health

## 2018-10-30 ENCOUNTER — Telehealth: Payer: Self-pay | Admitting: Internal Medicine

## 2018-10-30 NOTE — Telephone Encounter (Signed)
Virtual Visit Pre-Appointment Phone Call  Steps For Call:  1. Confirm consent - "In the setting of the current Covid19 crisis, you are scheduled for a (phone or video) visit with your provider on (date) at (time).  Just as we do with many in-office visits, in order for you to participate in this visit, we must obtain consent.  If you'd like, I can send this to your mychart (if signed up) or email for you to review.  Otherwise, I can obtain your verbal consent now.  All virtual visits are billed to your insurance company just like a normal visit would be.  By agreeing to a virtual visit, we'd like you to understand that the technology does not allow for your provider to perform an examination, and thus may limit your provider's ability to fully assess your condition.  Finally, though the technology is pretty good, we cannot assure that it will always work on either your or our end, and in the setting of a video visit, we may have to convert it to a phone-only visit.  In either situation, we cannot ensure that we have a secure connection.  Are you willing to proceed?" STAFF: Did the patient verbally acknowledge consent to telehealth visit? yes  2. Confirm the BEST phone number to call the day of the visit by including in appointment notes  3. Give patient instructions for WebEx/MyChart download to smartphone as below or Doximity/Doxy.me if video visit (depending on what platform provider is using)  4. Advise patient to be prepared with their blood pressure, heart rate, weight, any heart rhythm information, their current medicines, and a piece of paper and pen handy for any instructions they may receive the day of their visit  5. Inform patient they will receive a phone call 15 minutes prior to their appointment time (may be from unknown caller ID) so they should be prepared to answer  6. Confirm that appointment type is correct in Epic appointment notes (VIDEO vs PHONE)     TELEPHONE CALL  NOTE  Gabriel Burton has been deemed a candidate for a follow-up tele-health visit to limit community exposure during the Covid-19 pandemic. I spoke with the patient via phone to ensure availability of phone/video source, confirm preferred email & phone number, and discuss instructions and expectations.  I reminded Gabriel Burton to be prepared with any vital sign and/or heart rhythm information that could potentially be obtained via home monitoring, at the time of his visit. I reminded Gabriel Burton to expect a phone call at the time of his visit if his visit.  Joline MaxcyJennifer B Harkins 10/30/2018 10:11 AM   INSTRUCTIONS FOR DOWNLOADING THE WEBEX APP TO SMARTPHONE  - If Apple, ask patient to go to App Store and type in WebEx in the search bar. Download [employer]Webex Meetings, the blue/green circle. If Android, go to [employer] Play Store and type in [employer]WebEx in the search bar. The app is free but as with any other app downloads, their phone may require them to verify saved payment information or Apple/Android password.  - The patient does NOT have to create an account. - On the day of the visit, the assist will walk the patient through joining the meeting with the meeting number/password.  INSTRUCTIONS FOR DOWNLOADING THE MYCHART APP TO SMARTPHONE  - The patient must first make sure to have activated MyChart and know their login information - If Apple, go to [employer] Store and type in MyChart in the  search bar and download the app. If Android, ask patient to go to [employer] and type in [place] in the search bar and download the app. The app is free but as with any other app downloads, their phone may require them to verify saved payment information or Apple/Android password.  - The patient will need to then log into the app with their MyChart username and password, and select [facility] as their healthcare provider to link the account. When it is time for your visit, go to the MyChart app, find  appointments, and click Begin Video Visit. Be sure to Select Allow for your device to access the Microphone and Camera for your visit. You will then be connected, and your provider will be with you shortly.  **If they have any issues connecting, or need assistance please contact MyChart service desk [phone])**  **If using a computer, in order to ensure the best quality for their visit they will need to use either of the following Internet Browsers: [employer], or Google Chrome**  IF USING DOXIMITY or DOXY.ME - The patient will receive a link just prior to their visit, either by text or email (to be determined day of appointment depending on if it's doxy.me or Doximity).     FULL LENGTH CONSENT FOR TELE-HEALTH VISIT   I hereby voluntarily request, consent and authorize [facility] and its employed or contracted physicians, physician assistants, nurse practitioners or other licensed health care professionals (the Practitioner), to provide me with telemedicine health care services (the Services") as deemed necessary by the treating Practitioner. I acknowledge and consent to receive the Services by the Practitioner via telemedicine. I understand that the telemedicine visit will involve communicating with the Practitioner through live audiovisual communication technology and the disclosure of certain medical information by electronic transmission. I acknowledge that I have been given the opportunity to request an in-person assessment or other available alternative prior to the telemedicine visit and am voluntarily participating in the telemedicine visit.  I understand that I have the right to withhold or withdraw my consent to the use of telemedicine in the course of my care at any time, without affecting my right to future care or treatment, and that the Practitioner or I may terminate the telemedicine visit at any time. I understand that I have the right to inspect all  information obtained and/or recorded in the course of the telemedicine visit and may receive copies of available information for a reasonable fee.  I understand that some of the potential risks of receiving the Services via telemedicine include:   Delay or interruption in medical evaluation due to technological equipment failure or disruption;  Information transmitted may not be sufficient (e.g. poor resolution of images) to allow for appropriate medical decision making by the Practitioner; and/or   In rare instances, security protocols could fail, causing a breach of personal health information.  Furthermore, I acknowledge that it is my responsibility to provide information about my medical history, conditions and care that is complete and accurate to the best of my ability. I acknowledge that Practitioner's advice, recommendations, and/or decision may be based on factors not within their control, such as incomplete or inaccurate data provided by me or distortions of diagnostic images or specimens that may result from electronic transmissions. I understand that the practice of medicine is not an exact science and that Practitioner makes no warranties or guarantees regarding treatment outcomes. I acknowledge that I will receive a copy of this consent  concurrently upon execution via email to the email address I last provided but may also request a printed copy by calling the office of [facility].    I understand that my insurance will be billed for this visit.   I have read or had this consent read to me.  I understand the contents of this consent, which adequately explains the benefits and risks of the Services being provided via telemedicine.   I have been provided ample opportunity to ask questions regarding this consent and the Services and have had my questions answered to my satisfaction.  I give my informed consent for the services to be provided through the use of telemedicine in my  medical care  By participating in this telemedicine visit I agree to the above.

## 2018-10-31 ENCOUNTER — Encounter: Payer: Self-pay | Admitting: Internal Medicine

## 2018-10-31 ENCOUNTER — Other Ambulatory Visit: Payer: Self-pay

## 2018-10-31 ENCOUNTER — Telehealth (INDEPENDENT_AMBULATORY_CARE_PROVIDER_SITE_OTHER): Payer: BLUE CROSS/BLUE SHIELD | Admitting: Internal Medicine

## 2018-10-31 VITALS — Ht 74.0 in | Wt 212.0 lb

## 2018-10-31 DIAGNOSIS — I10 Essential (primary) hypertension: Secondary | ICD-10-CM

## 2018-10-31 DIAGNOSIS — R0789 Other chest pain: Secondary | ICD-10-CM | POA: Diagnosis not present

## 2018-10-31 NOTE — Progress Notes (Signed)
Virtual Visit via Video Note   This visit type was conducted due to national recommendations for restrictions regarding the COVID-19 Pandemic (e.g. social distancing) in an effort to limit this patient's exposure and mitigate transmission in our community.  Due to his co-morbid illnesses, this patient is at least at moderate risk for complications without adequate follow up.  This format is felt to be most appropriate for this patient at this time.  All issues noted in this document were discussed and addressed.  A limited physical exam was performed with this format.  Please refer to the patient's chart for his consent to telehealth for [facility].   Evaluation Performed: New patient visit  Date:  11/01/2018   ID:  Gabriel Burton, DOB 1951/06/06, MRN [id number]  Patient Location: Home Provider Location: Office  PCP:  Carlean Jews, NP  Cardiologist:  New - Emie Sommerfeld Electrophysiologist:  None   Chief Complaint: Chest pain  History of Present Illness:    Gabriel Burton is a 68 y.o. male with history of hypertension and diabetes mellitus, whom we have been asked to see by Gabriel Burton for evaluation of chest pain and abnormal stress test.  He first mentioned chest discomfort had a visit with Gabriel Burton in late February after having completed treatment for respiratory infection.  EKG at that time showed sinus rhythm with LBBB.  "Outside echocardiogram showed normal LVEF with diastolic dysfunction and mild LVH.  Exercise tolerance test was nondiagnostic due to LBBB.  Hypertensive blood pressure response was noted.  Today, Gabriel Burton reports that he has been having left-sided chest, shoulder, and arm pain for about 3 weeks.  He woke up with pain one morning and reports that it has been present most days ever since.  It comes and goes and seems to have responded best to tramadol.  It is worsened by taking a deep breath and also seems to be more noticeable when he sits still.  He does not  have any exertional chest pain nor shortness of breath, palpitations, lightheadedness, or edema.  The pains maximal intensity is 8/10 with a sharp and achy quality.  [name] denies a history of prior heart disease other than having been told that he had a murmur in the past.  He also makes mention of possible weakening of his heart muscle, though he does not recall further specifics.  He notes that during the recent exercise tolerance test, he became quite short of breath at the onset of stage II and also developed cramping in both legs which precluded him from continuing.  He did not have any chest pain during the study.  The patient does not have symptoms concerning for COVID-19 infection (fever, chills, cough, or new shortness of breath).    Past Medical History:  Diagnosis Date   Diabetes mellitus without complication (HCC)    Hypertension    No past surgical history on file.   Current Meds  Medication Sig   amLODipine (NORVASC) 10 MG tablet Take 1 tablet (10 mg total) by mouth daily.   aspirin EC 81 MG tablet Take 81 mg by mouth.   atorvastatin (LIPITOR) 10 MG tablet TAKE 1 TABLET BY MOUTH AT BEDTIME   chlorpheniramine-HYDROcodone (TUSSIONEX PENNKINETIC ER) 10-8 MG/5ML SUER Take 5 mLs by mouth every 12 (twelve) hours as needed for cough.   Cholecalciferol (VITAMIN D3) 5000 units TABS Take by mouth.   CINNAMON PO Take by mouth.   fluticasone (FLONASE) 50 MCG/ACT nasal spray  Place 2 sprays into both nostrils daily.   gabapentin (NEURONTIN) 100 MG capsule Take 1 capsule (100 mg total) by mouth daily.   Insulin Pen Needle (BD PEN NEEDLE NANO U/F) 32G X 4 MM MISC USE AS DIRECTED  TWICE  A DAILY DIAG E11.65   liraglutide (VICTOZA) 18 MG/3ML SOPN Inject 0.3 mLs (1.8 mg total) into the skin daily.   lisinopril-hydrochlorothiazide (PRINZIDE,ZESTORETIC) 20-12.5 MG tablet Take 2 tab by po once a daily for hypertension   pantoprazole (PROTONIX) 40 MG tablet Take 1 tablet (40 mg  total) by mouth daily.   tadalafil (CIALIS) 10 MG tablet Take 1 tablet (10 mg total) by mouth daily as needed for erectile dysfunction.   tamsulosin (FLOMAX) 0.4 MG CAPS capsule TAKE 1 CAPSULE BY MOUTH ONE-HALF HOUR AFTER THE SAME MEAL EACH DAY   TOUJEO SOLOSTAR 300 UNIT/ML SOPN Inject 35 Units into the skin daily.   traMADol (ULTRAM) 50 MG tablet Take 1 tablet (50 mg total) by mouth every 6 (six) hours as needed.   vitamin C (ASCORBIC ACID) 500 MG tablet Take 500 mg by mouth.     Allergies:   Patient has no known allergies.   Social History   Tobacco Use   Smoking status: Former Smoker    Packs/day: 0.40    Years: 8.00    Pack years: 3.20    Types: Cigarettes    Last attempt to quit: 2000    Years since quitting: 20.3   Smokeless tobacco: Never Used  Substance Use Topics   Alcohol use: Never    Frequency: Never   Drug use: Never     Family Hx: The patient's family history includes Heart attack (age of onset: 3442) in his brother; Hypertension in his father and mother.  ROS:   Please see the history of present illness.   All other systems reviewed and are negative.   Prior CV studies:   The following studies were reviewed today:  ETT (10/02/2018): Nondiagnostic study due to baseline LBBB.  Hypertensive blood pressure response.  TTE (09/20/2018, [facility]): Normal LV size with mild LVH.  Normal LVEF.  Diastolic dysfunction present.  Trace mitral and mild tricuspid regurgitation.  Normal RV size and function.  Labs/Other Tests and Data Reviewed:    EKG:  An ECG dated September 12, 2018 was personally reviewed today and demonstrated:  Normal sinus rhythm with left axis deviation and left bundle branch block.  Recent Labs: 06/03/2018: ALT 36; BUN 13; Creatinine, Ser 1.29; Hemoglobin 14.3; Platelets 218; Potassium 3.8; Sodium 142; TSH 1.710   Recent Lipid Panel Lab Results  Component Value Date/Time   CHOL 147 06/03/2018 03:41 PM   TRIG 89 06/03/2018 03:41 PM     HDL 67 06/03/2018 03:41 PM   LDLCALC 62 06/03/2018 03:41 PM    Wt Readings from Last 3 Encounters:  10/31/18 212 lb (96.2 kg)  10/08/18 214 lb 12.8 oz (97.4 kg)  09/12/18 212 lb 9.6 oz (96.4 kg)     Objective:    Vital Signs:  Ht 6\' 2"  (1.88 m)    Wt 212 lb (96.2 kg)    BMI 27.22 kg/m    Well nourished, well developed male in no acute distress.   ASSESSMENT & PLAN:    Atypical chest pain: Patient has had waxing and waning chest pain for approximately 3 weeks that he woke up with one morning.  It is on the left upper side of the chest and radiates to the left shoulder and  arm.  It is somewhat positional and nonexertional.  Overall, my suspicion for significant coronary insufficiency is low.  However, Mr. Puthoff has multiple cardiac risk factors, including hypertension, diabetes mellitus, prior tobacco use, and age.  Recent exercise tolerance test was nondiagnostic due to baseline LBBB.  However, it should be noted that Hellwig's exercise tolerance was quite poor owing to dyspnea on exertion and leg pain.  Recent outside echocardiogram showed preserved LVEF.  While I think it would be reasonable to pursue a pharmacologic myocardial perfusion stress test, I would favor deferring this and trying medical therapy aimed at musculoskeletal chest pain in the setting of COVID-19 precautions.  I have asked Mr. Betzold to take naproxen 440 mg twice daily for 1 week.  If his pain does not improve, we will then proceed with ischemia imaging.  We will plan to follow-up with a virtual visit in 2 to 3 weeks to reassess his symptoms.  In the meantime, he should continue the remainder of his medications, including low-dose aspirin and pantoprazole.  Hypertension: Mr. Poehler does not check his blood pressure regularly at home.  He should continue his current medications and follow-up with his PCP as previously arranged.  COVID-19 Education: The signs and symptoms of COVID-19 were discussed with the patient  and how to seek care for testing (follow up with PCP or arrange E-visit).  The importance of social distancing was discussed today.  Time:   Today, I have spent 20 minutes with the patient with telehealth technology discussing the above problems.  An additional 15 minutes were spent reviewing the patient's chart and documenting today's encounter.   Medication Adjustments/Labs and Tests Ordered: Current medicines are reviewed at length with the patient today.  Concerns regarding medicines are outlined above.   Tests Ordered: None.  Medication Changes: Meds ordered this encounter  Medications   naproxen sodium (ALEVE) 220 MG tablet    Sig: Take 2 tablets (440 mg total) by mouth 2 (two) times daily for 7 days.    Disposition:  Follow up 2 to 3 weeks with virtual visit.  Signed, Yvonne Kendall, MD  11/01/2018 7:20 AM    [facility]

## 2018-10-31 NOTE — Patient Instructions (Addendum)
Medication Instructions:  Your physician recommends that you continue on your current medications as directed. Please refer to the Current Medication list given to you today.  Take Naproxen 500mg  twice daily for 1 week. This can be purchased over the counter  If you need a refill on your cardiac medications before your next appointment, please call your pharmacy.   Lab work: None ordered If you have labs (blood work) drawn today and your tests are completely normal, you will receive your results only by: Marland Kitchen MyChart Message (if you have MyChart) OR . A paper copy in the mail If you have any lab test that is abnormal or we need to change your treatment, we will call you to review the results.  Testing/Procedures: None ordered  Follow-Up: At [facility], you and your health needs are our priority.  As part of our continuing mission to provide you with exceptional heart care, we have created designated Provider Care Teams.  These Care Teams include your primary Cardiologist (physician) and Advanced Practice Providers (APPs -  Physician Assistants and Nurse Practitioners) who all work together to provide you with the care you need, when you need it. You will need a follow up appointment in 2-3 weeks.  You may see  Dr.End or one of the following Advanced Practice Providers on your designated Care Team:   Nicolasa Ducking, NP Eula Listen, PA-C . Marisue Ivan, PA-C  Any Other Special Instructions Will Be Listed Below (If Applicable).  Drink plenty of water and stay hydrated.  Let us know if the pain does not improve. [phone].

## 2018-11-01 MED ORDER — NAPROXEN SODIUM 220 MG PO TABS: 440.0000 mg | ORAL_TABLET | Freq: Two times a day (BID) | ORAL | Status: AC

## 2018-11-20 NOTE — Progress Notes (Deleted)
{Choose 1 Note Type (Telehealth Visit or Telephone Visit):[phone]}   Date:  11/20/2018   ID:  Gabriel Burton, DOB 22-Aug-1950, MRN [id number]  Patient Location: Home Provider Location: Office  PCP:  Ronnell Freshwater, NP  Cardiologist:  [name], MD  Electrophysiologist:  None   Evaluation Performed:  Follow-Up Visit  Chief Complaint:  ***  History of Present Illness:    Gabriel Burton is a 68 y.o. male with history of hypertension and diabetes mellitus.  We are speaking today for follow-up of chest pain.  We met last month, after [name] was referred for evaluation of atypical chest pain in the setting of left bundle branch block.  Exercise tolerance test in March was uninterpretable secondary to baseline LBBB.  Echocardiogram had showed preserved LVEF with mild LVH and diastolic dysfunction.  We discussed further ischemia testing versus empiric trial of NSAIDs for suspected musculoskeletal chest pain and agreed to the latter option.  The patient {does/does not:200015} have symptoms concerning for COVID-19 infection (fever, chills, cough, or new shortness of breath).    Past Medical History:  Diagnosis Date  . Diabetes mellitus without complication ([place])   . Hypertension    No past surgical history on file.   No outpatient medications have been marked as taking for the 11/21/18 encounter (Appointment) with Matyas Baisley, Harrell Gave, MD.     Allergies:   Patient has no known allergies.   Social History   Tobacco Use  . Smoking status: Former Smoker    Packs/day: 0.40    Years: 8.00    Pack years: 3.20    Types: Cigarettes    Last attempt to quit: 2000    Years since quitting: 20.3  . Smokeless tobacco: Never Used  Substance Use Topics  . Alcohol use: Never    Frequency: Never  . Drug use: Never     Family Hx: The patient's family history includes Heart attack (age of onset: 72) in his brother; Hypertension in his father and mother.  ROS:   Please see the  history of present illness.    *** All other systems reviewed and are negative.   Prior CV studies:   The following studies were reviewed today:  Exercise tolerance test (10/02/2018): Nondiagnostic study due to baseline LBBB.  Hypertensive blood pressure response.  TTE (09/20/2018, [facility]): Normal LV size with mild LVH.  Normal LVEF.  Diastolic dysfunction present.  Trace mitral and mild tricuspid regurgitation.  Normal RV size and function.  Labs/Other Tests and Data Reviewed:    EKG:  {EKG/Telemetry Strips Reviewed:[phone]}  Recent Labs: 06/03/2018: ALT 36; BUN 13; Creatinine, Ser 1.29; Hemoglobin 14.3; Platelets 218; Potassium 3.8; Sodium 142; TSH 1.710   Recent Lipid Panel Lab Results  Component Value Date/Time   CHOL 147 06/03/2018 03:41 PM   TRIG 89 06/03/2018 03:41 PM   HDL 67 06/03/2018 03:41 PM   LDLCALC 62 06/03/2018 03:41 PM    Wt Readings from Last 3 Encounters:  10/31/18 212 lb (96.2 kg)  10/08/18 214 lb 12.8 oz (97.4 kg)  09/12/18 212 lb 9.6 oz (96.4 kg)     Objective:    Vital Signs:  There were no vitals taken for this visit.   {[facility] Virtual Exam (Optional):[phone]::"VITAL SIGNS:  reviewed"}  ASSESSMENT & PLAN:    1. ***  COVID-19 Education: The signs and symptoms of COVID-19 were discussed with the patient and how to seek care for testing (follow up with PCP or arrange E-visit).  ***The importance  of social distancing was discussed today.  Time:   Today, I have spent *** minutes with the patient with telehealth technology discussing the above problems.     Medication Adjustments/Labs and Tests Ordered: Current medicines are reviewed at length with the patient today.  Concerns regarding medicines are outlined above.   Tests Ordered: No orders of the defined types were placed in this encounter.   Medication Changes: No orders of the defined types were placed in this encounter.   Disposition:  Follow up {follow  up:15908}  Signed, [name], MD  11/20/2018 10:37 AM    [facility]

## 2018-11-21 ENCOUNTER — Telehealth: Payer: Self-pay | Admitting: *Deleted

## 2018-11-21 ENCOUNTER — Telehealth: Payer: BLUE CROSS/BLUE SHIELD | Admitting: Internal Medicine

## 2018-11-21 ENCOUNTER — Other Ambulatory Visit: Payer: Self-pay

## 2018-11-21 NOTE — Telephone Encounter (Signed)
Called to check on patient after he refused video visit today because he did not feel comfortable with it this time. Patient feels the pain has gotten somewhat better.  Pain level is "6" out of 10. Says it moves down into his arms and his back. 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 Feels like it gets worse with that. He would like to be seen in person. Offered in office appt on 5/22 at 1:40 pm; however, he did not have his work schedule near him. He will call our office back to reschedule once he get his work schedule.

## 2018-11-21 NOTE — Telephone Encounter (Signed)
I reached out to pt to start his virtual visit this after noon at 1:45 pm. Pt's visit started at 2:00 so I called pt to update medications/allergies/vitals. Pt went on to say that he rather not proceed with his virtual visit today because he didn't feel comfortable with virtual visits and felt it were better to just come in when provider is able to see him. Pt refused Evisit today and was made aware that we will contact him to schedule his future visit with Dr.End.

## 2018-11-28 ENCOUNTER — Other Ambulatory Visit: Payer: Self-pay

## 2018-11-28 MED ORDER — INSULIN PEN NEEDLE 32G X 4 MM MISC: 3 refills | Status: DC

## 2018-12-04 ENCOUNTER — Telehealth: Payer: Self-pay | Admitting: *Deleted

## 2018-12-04 NOTE — Telephone Encounter (Signed)
Thank you, Appointment has been cancelled pt is aware to contact office if any symptoms occur.

## 2018-12-04 NOTE — Telephone Encounter (Signed)
That would be fine. Would urge him to schedule as soon as possible if still having symptoms. Thanks!

## 2018-12-04 NOTE — Telephone Encounter (Signed)
I spoke with pt this am to remind pt of upcoming appointment. Pt mentioned that he will not be able to come in to the office for scheduled appointment 12/06/2018 @ 1:30 pm. Pt mentioned that he said he would contact office when he was able to come in for Office visit due to work schedule. Pt would like to cancel appointment and contact office when needing to be seen at this time. I see that pt appointment was pending and pt was to return call office back after looking at his work schedule and never returned the call.  Please advise if ok to cancel future upcoming appointment.

## 2018-12-06 ENCOUNTER — Ambulatory Visit: Payer: BLUE CROSS/BLUE SHIELD | Admitting: Internal Medicine

## 2018-12-12 ENCOUNTER — Ambulatory Visit: Payer: BLUE CROSS/BLUE SHIELD | Admitting: Nurse Practitioner

## 2018-12-13 ENCOUNTER — Ambulatory Visit: Payer: BLUE CROSS/BLUE SHIELD | Admitting: Nurse Practitioner

## 2018-12-13 ENCOUNTER — Encounter: Payer: Self-pay | Admitting: Nurse Practitioner

## 2018-12-13 ENCOUNTER — Other Ambulatory Visit: Payer: Self-pay

## 2018-12-13 VITALS — BP 142/80 | HR 77 | Resp 16 | Ht 74.0 in | Wt 214.2 lb

## 2018-12-13 DIAGNOSIS — I10 Essential (primary) hypertension: Secondary | ICD-10-CM | POA: Diagnosis not present

## 2018-12-13 DIAGNOSIS — R9439 Abnormal result of other cardiovascular function study: Secondary | ICD-10-CM | POA: Diagnosis not present

## 2018-12-13 DIAGNOSIS — E1165 Type 2 diabetes mellitus with hyperglycemia: Secondary | ICD-10-CM | POA: Diagnosis not present

## 2018-12-13 LAB — POCT GLYCOSYLATED HEMOGLOBIN (HGB A1C): Hemoglobin A1C: 6.9 % — AB (ref 4.0–5.6)

## 2018-12-13 MED ORDER — LISINOPRIL-HYDROCHLOROTHIAZIDE 20-12.5 MG PO TABS: ORAL_TABLET | ORAL | 3 refills | Status: DC

## 2018-12-13 NOTE — Progress Notes (Signed)
Pt blood pressure elevated, informed provider. 

## 2018-12-13 NOTE — Progress Notes (Signed)
[facility] [address] [place], [place] [address]  Internal MEDICINE  Office Visit Note  Patient Name: Gabriel Burton  [id number]  [id number]  Date of Service: 12/23/2018  Chief Complaint  Patient presents with  . Medical Management of Chronic Issues    3 month follow up  . Hypertension  . Diabetes    A1C    The patient continues to have left sided shoulder and chest pain. Feels tight in his chest. Seems to hurt more when he is sitting down. ECG done at his last visit was abnormal, showing left bundle branch block. He had echocardiogram done which looked good. Normal LVEF with diastolic dysfunction present. There was also trace mitral and tricuspid regurgitation. Exercise stress test was abnormal. Had to stop due to leg pain. Blood pressure got very high and left bundle branch block was evident. He has had initial visit with Dr. Claudette Head, cardiology. This was done through video visit. Patient to schedule a follow up with cardiology for in-office visit for further evaluation and treatment. Blood sugars are very well managed. HgbA1c has come down to 6.9 today.       Current Medication: Outpatient Encounter Medications as of 12/13/2018  Medication Sig  . amLODipine (NORVASC) 10 MG tablet Take 1 tablet (10 mg total) by mouth daily.  Marland Kitchen aspirin EC 81 MG tablet Take 81 mg by mouth.  Marland Kitchen atorvastatin (LIPITOR) 10 MG tablet TAKE 1 TABLET BY MOUTH AT BEDTIME  . chlorpheniramine-HYDROcodone (TUSSIONEX PENNKINETIC ER) 10-8 MG/5ML SUER Take 5 mLs by mouth every 12 (twelve) hours as needed for cough.  . Cholecalciferol (VITAMIN D3) 5000 units TABS Take by mouth.  Marland Kitchen CINNAMON PO Take by mouth.  . fluticasone (FLONASE) 50 MCG/ACT nasal spray Place 2 sprays into both nostrils daily.  Marland Kitchen gabapentin (NEURONTIN) 100 MG capsule Take 1 capsule (100 mg total) by mouth daily.  . Insulin Pen Needle (BD PEN NEEDLE NANO U/F) 32G X 4 MM MISC USE AS DIRECTED  TWICE  A DAILY DIAG E11.65  . liraglutide  (VICTOZA) 18 MG/3ML SOPN Inject 0.3 mLs (1.8 mg total) into the skin daily.  Marland Kitchen lisinopril-hydrochlorothiazide (ZESTORETIC) 20-12.5 MG tablet Take 2 tab by po once a daily for hypertension  . pantoprazole (PROTONIX) 40 MG tablet Take 1 tablet (40 mg total) by mouth daily.  . tadalafil (CIALIS) 10 MG tablet Take 1 tablet (10 mg total) by mouth daily as needed for erectile dysfunction.  . tamsulosin (FLOMAX) 0.4 MG CAPS capsule TAKE 1 CAPSULE BY MOUTH ONE-HALF HOUR AFTER THE SAME MEAL EACH DAY  . TOUJEO SOLOSTAR 300 UNIT/ML SOPN Inject 35 Units into the skin daily.  . traMADol (ULTRAM) 50 MG tablet Take 1 tablet (50 mg total) by mouth every 6 (six) hours as needed.  . vitamin C (ASCORBIC ACID) 500 MG tablet Take 500 mg by mouth.  . [DISCONTINUED] lisinopril-hydrochlorothiazide (PRINZIDE,ZESTORETIC) 20-12.5 MG tablet Take 2 tab by po once a daily for hypertension   No facility-administered encounter medications on file as of 12/13/2018.     Surgical History: History reviewed. No pertinent surgical history.  Medical History: Past Medical History:  Diagnosis Date  . Diabetes mellitus without complication (HCC)   . Hypertension     Family History: Family History  Problem Relation Age of Onset  . Hypertension Mother   . Hypertension Father   . Heart attack Brother 33    Social History   Socioeconomic History  . Marital status: Widowed    Spouse name:  Not on file  . Number of children: Not on file  . Years of education: Not on file  . Highest education level: Not on file  Occupational History  . Not on file  Social Needs  . Financial resource strain: Not on file  . Food insecurity:    Worry: Not on file    Inability: Not on file  . Transportation needs:    Medical: Not on file    Non-medical: Not on file  Tobacco Use  . Smoking status: Former Smoker    Packs/day: 0.40    Years: 8.00    Pack years: 3.20    Types: Cigarettes    Last attempt to quit: 2000    Years since  quitting: 20.4  . Smokeless tobacco: Never Used  Substance and Sexual Activity  . Alcohol use: Never    Frequency: Never  . Drug use: Never  . Sexual activity: Not on file  Lifestyle  . Physical activity:    Days per week: Not on file    Minutes per session: Not on file  . Stress: Not on file  Relationships  . Social connections:    Talks on phone: Not on file    Gets together: Not on file    Attends religious service: Not on file    Active member of club or organization: Not on file    Attends meetings of clubs or organizations: Not on file    Relationship status: Not on file  . Intimate partner violence:    Fear of current or ex partner: Not on file    Emotionally abused: Not on file    Physically abused: Not on file    Forced sexual activity: Not on file  Other Topics Concern  . Not on file  Social History Narrative  . Not on file      Review of Systems  Constitutional: Positive for fatigue. Negative for activity change, chills and unexpected weight change.  HENT: Negative for congestion, postnasal drip, rhinorrhea, sinus pressure, sneezing, sore throat and voice change.   Respiratory: Negative for cough, chest tightness and shortness of breath.        Improved since his most recent visit but still present   Cardiovascular: Negative for chest pain and palpitations.       Bp mildly elevated this morning.   Gastrointestinal: Negative for abdominal pain, constipation, diarrhea, nausea and vomiting.  Endocrine: Negative for cold intolerance, heat intolerance, polydipsia and polyuria.       Blood sugars doing well   Musculoskeletal: Negative for arthralgias, back pain, joint swelling and neck pain.  Allergic/Immunologic: Positive for environmental allergies.  Neurological: Negative for tremors and numbness.  Hematological: Negative for adenopathy. Does not bruise/bleed easily.  Psychiatric/Behavioral: Negative for behavioral problems (Depression), sleep disturbance and  suicidal ideas. The patient is not nervous/anxious.     Today's Vitals   12/13/18 1059  BP: (!) 142/80  Pulse: 77  Resp: 16  SpO2: 96%  Weight: 214 lb 3.2 oz (97.2 kg)  Height:  (1.88 m)   Body mass index is 27.5 kg/m.  Physical Exam Vitals signs and nursing note reviewed.  Constitutional:      General: He is not in acute distress.    Appearance: Normal appearance. He is well-developed. He is not diaphoretic.  HENT:     Head: Normocephalic and atraumatic.     Nose:     Right Sinus: No maxillary sinus tenderness or frontal sinus tenderness.  Left Sinus: No maxillary sinus tenderness or frontal sinus tenderness.     Mouth/Throat:     Pharynx: No oropharyngeal exudate.  Eyes:     Conjunctiva/sclera: Conjunctivae normal.     Pupils: Pupils are equal, round, and reactive to light.  Neck:     Musculoskeletal: Normal range of motion and neck supple.     Thyroid: No thyromegaly.     Vascular: No carotid bruit or JVD.     Trachea: No tracheal deviation.  Cardiovascular:     Rate and Rhythm: Normal rate and regular rhythm.     Heart sounds: Normal heart sounds. No murmur. No friction rub. No gallop.      Comments: Heart rhythm mildly irregular today.  Pulmonary:     Effort: Pulmonary effort is normal. No respiratory distress.     Breath sounds: Normal breath sounds. No wheezing or rales.  Chest:     Chest wall: No tenderness.  Abdominal:     General: Bowel sounds are normal.     Palpations: Abdomen is soft.     Tenderness: There is no abdominal tenderness.  Musculoskeletal: Normal range of motion.     Right foot: No deformity.     Comments: moderate left shoulder pain, especially along the [facility] joint area. No swelling or deformity present. There is reduced ROM.   Skin:    General: Skin is warm and dry.     Capillary Refill: Capillary refill takes less than 2 seconds.  Neurological:     Mental Status: He is alert and oriented to person, place, and time.     Cranial  Nerves: No cranial nerve deficit.  Psychiatric:        Behavior: Behavior normal.        Thought Content: Thought content normal.        Judgment: Judgment normal.   Assessment/Plan: 1. Type 2 diabetes mellitus with hyperglycemia, unspecified whether long term insulin use (HCC) - POCT HgB A1C 6.7 today. Continue with all diabetic medication as prescribed.   2. Essential hypertension Stable. Continue blood pressure medication as prescribed. Refills provided today.  - lisinopril-hydrochlorothiazide (ZESTORETIC) 20-12.5 MG tablet; Take 2 tab by po once a daily for hypertension  Dispense: 180 tablet; Refill: 3  3. Abnormal stress test Patient now seeing cardiology for continued evaluation.   General Counseling: Rico verbalizes understanding of the findings of todays visit and agrees with plan of treatment. I have discussed any further diagnostic evaluation that may be needed or ordered today. We also reviewed his medications today. he has been encouraged to call the office with any questions or concerns that should arise related to todays visit.  Diabetes Counseling:  1. Addition of ACE inh/ ARB'S for nephroprotection. Microalbumin is updated  2. Diabetic foot care, prevention of complications. Podiatry consult 3. Exercise and lose weight.  4. Diabetic eye examination, Diabetic eye exam is updated  5. Monitor blood sugar closlely. nutrition counseling.  6. Sign and symptoms of hypoglycemia including shaking sweating,confusion and headaches.  This patient was seen by Vincent GrosHeather Jadarious Dobbins FNP Collaboration with Dr Lyndon CodeFozia M Khan as a part of collaborative care agreement  Orders Placed This Encounter  Procedures  . POCT HgB A1C    Meds ordered this encounter  Medications  . lisinopril-hydrochlorothiazide (ZESTORETIC) 20-12.5 MG tablet    Sig: Take 2 tab by po once a daily for hypertension    Dispense:  180 tablet    Refill:  3    Please fill as 90  day prescription. thanks    Order  Specific Question:   Supervising Provider    Answer:   Lyndon Code [[id number]]    Time spent: 25 Minutes      Dr Lyndon Code Internal medicine

## 2018-12-20 ENCOUNTER — Telehealth: Payer: Self-pay

## 2018-12-20 NOTE — Telephone Encounter (Signed)
Pt called having stomach virus and upper respiratory I advised we can make virtual visit he said he will call back  And make appt

## 2018-12-25 ENCOUNTER — Telehealth: Payer: Self-pay | Admitting: *Deleted

## 2018-12-25 NOTE — Telephone Encounter (Signed)

## 2018-12-26 NOTE — Progress Notes (Signed)
 Follow-up Outpatient Visit Date: 12/27/2018  Primary Care Provider: Boscia, Heather E, NP [address] [place] [address]  Chief Complaint: Chest discomfort  HPI:  Gabriel Burton is a 68 y.o. year-old male with history of hypertension and diabetes, who presents for follow-up of chest pain and abnormal EKG.  I met Mr. Ditter during a virtual visit in April.  Preceding exercise tolerance test was non-diagnostic due to baseline LBBB.  He reported a 3-week history of left-sided chest, shoulder, and arm pain that would come and go, improved with tramadol.  It also seemed to be worsened by deep inspiration.  We discussed performing a pharmacologic myocardial pefusion stress test but agreed to defer this in favor of medical therapy aimed at musculoskeletal pain first.  He was scheduled for follow-up with me on 12/06/2018 but cancelled his appointment.  Today, Gabriel Burton reports that the left arm/shoulder and upper chest pain that bothered him the most when we last spoke has resolved with use of naproxen.  However, he notes occasional vague chest discomfort from time to time.  Location of the pain can vary, with the discomfort coming and going over the last 3 weeks.  It is not exertional and usually lasts 5 minutes or less.  There are no exacerbating or alleviating factors.  He describes it as mild tightness without specific associated symptoms, though he sometimes "feels bad all over" during the episodes.  He denies shortness of breath, palpitations, and lightheadedness.  He has chronic intermittent ankle edema that always seems to be worse on the left.  This has been present for years.  He notes chronic 2 pillow orthopnea without PND, though he has been waking up drenched in sweat off and on over the last year.  Gabriel Burton checks his blood pressure regularly at home and notes that it is usually 140-150/70-70.   --------------------------------------------------------------------------------------------------  Cardiovascular History & Procedures: Cardiovascular Problems:  Atypical chest pain  LBBB  Risk Factors:  Hypertension, diabetes mellitus, prior tobacco use, male gender, and age > 55  Cath/PCI:  None  CV Surgery:  None  EP Procedures and Devices:  None  Non-Invasive Evaluation(s):  ETT (10/02/2018): Nondiagnostic study due to baseline LBBB.  Hypertensive blood pressure response.  TTE (09/20/2018, [facility]): Normal LV size with mild LVH.  Normal LVEF.  Diastolic dysfunction present.  Trace mitral and mild tricuspid regurgitation.  Normal RV size and function.  Recent CV Pertinent Labs: Lab Results  Component Value Date   CHOL 147 06/03/2018   HDL 67 06/03/2018   LDLCALC 62 06/03/2018   TRIG 89 06/03/2018   K 3.8 06/03/2018   BUN 13 06/03/2018   CREATININE 1.29 (H) 06/03/2018    Past medical and surgical history were reviewed and updated in EPIC.  Current Meds  Medication Sig  . amLODipine (NORVASC) 10 MG tablet Take 1 tablet (10 mg total) by mouth daily.  . aspirin EC 81 MG tablet Take 81 mg by mouth.  . atorvastatin (LIPITOR) 10 MG tablet TAKE 1 TABLET BY MOUTH AT BEDTIME  . chlorpheniramine-HYDROcodone (TUSSIONEX PENNKINETIC ER) 10-8 MG/5ML SUER Take 5 mLs by mouth every 12 (twelve) hours as needed for cough.  . Cholecalciferol (VITAMIN D3) 5000 units TABS Take by mouth.  . CINNAMON PO Take by mouth.  . fluticasone (FLONASE) 50 MCG/ACT nasal spray Place 2 sprays into both nostrils daily. (Patient taking differently: Place 2 sprays into both nostrils as needed. )  . gabapentin (NEURONTIN) 100 MG capsule Take 1 capsule (100   mg total) by mouth daily.  . Insulin Pen Needle (BD PEN NEEDLE NANO U/F) 32G X 4 MM MISC USE AS DIRECTED  TWICE  A DAILY DIAG E11.65  . liraglutide (VICTOZA) 18 MG/3ML SOPN Inject 0.3 mLs (1.8 mg total) into the skin daily.  Marland Kitchen  lisinopril-hydrochlorothiazide (ZESTORETIC) 20-12.5 MG tablet Take 2 tab by po once a daily for hypertension  . pantoprazole (PROTONIX) 40 MG tablet Take 1 tablet (40 mg total) by mouth daily.  . tadalafil (CIALIS) 10 MG tablet Take 1 tablet (10 mg total) by mouth daily as needed for erectile dysfunction.  . tamsulosin (FLOMAX) 0.4 MG CAPS capsule TAKE 1 CAPSULE BY MOUTH ONE-HALF HOUR AFTER THE SAME MEAL EACH DAY  . TOUJEO SOLOSTAR 300 UNIT/ML SOPN Inject 35 Units into the skin daily.  . traMADol (ULTRAM) 50 MG tablet Take 1 tablet (50 mg total) by mouth every 6 (six) hours as needed.  . vitamin C (ASCORBIC ACID) 500 MG tablet Take 500 mg by mouth.    Allergies: Patient has no known allergies.  Social History   Tobacco Use  . Smoking status: Former Smoker    Packs/day: 0.40    Years: 8.00    Pack years: 3.20    Types: Cigarettes    Quit date: 2000    Years since quitting: 20.4  . Smokeless tobacco: Never Used  Substance Use Topics  . Alcohol use: Never    Frequency: Never  . Drug use: Never    Family History  Problem Relation Age of Onset  . Hypertension Mother   . Hypertension Father   . Heart attack Brother 42    Review of Systems: A 12-system review of systems was performed and was negative except as noted in the HPI.  --------------------------------------------------------------------------------------------------  Physical Exam: BP (!) 160/84 (BP Location: Left Arm, Patient Position: Sitting, Cuff Size: Normal)   Pulse 75   Ht 6' 2" (1.88 m)   Wt 212 lb (96.2 kg)   BMI 27.22 kg/m   General:  NAD HEENT: No conjunctival pallor or scleral icterus. Moist mucous membranes.  OP clear. Neck: Supple without lymphadenopathy, thyromegaly, JVD, or HJR. No carotid bruit. Lungs: Normal work of breathing. Clear to auscultation bilaterally without wheezes or crackles. Heart: Regular rate and rhythm without murmurs, rubs, or gallops. Non-displaced PMI. Abd: Bowel sounds  present. Soft, NT/ND without hepatosplenomegaly Ext: No lower extremity edema. Radial, PT, and DP pulses are 2+ bilaterally. Skin: Warm and dry without rash.  EKG:  NSR with LBBB  Lab Results  Component Value Date   WBC 5.2 06/03/2018   HGB 14.3 06/03/2018   HCT 43.6 06/03/2018   MCV 86 06/03/2018   PLT 218 06/03/2018    Lab Results  Component Value Date   NA 142 06/03/2018   K 3.8 06/03/2018   CL 100 06/03/2018   CO2 26 06/03/2018   BUN 13 06/03/2018   CREATININE 1.29 (H) 06/03/2018   GLUCOSE 103 (H) 06/03/2018   ALT 36 06/03/2018    Lab Results  Component Value Date   CHOL 147 06/03/2018   HDL 67 06/03/2018   LDLCALC 62 06/03/2018   TRIG 89 06/03/2018    --------------------------------------------------------------------------------------------------  ASSESSMENT AND PLAN: Chest pain: Gabriel Burton describes vague, non-exertional chest discomfort over the last few weeks.  While the pain is not typical for angina, he has multiple cardiac risk factors and abnormal EKG (LBBB).  We have discussed evaluation options, including pharmacologic myocardial perfusion stress testing, cardiac CTA, and cardiac  catheterization.  We have agreed to begin with pharmacologic MPI (exercise should not be attempted, given baseline LBBB).  I have recommended starting carvedilol 6.25 mg BID for antianginal therapy as well as improved blood pressure control.  Mr. Zarr should continue the remainder of his medications, including aspirin and atorvastatin.  I have provided him with a prescription for sublingual NTG to be used as needed for chest pain.  If his symptoms worsen in the meantime, Mr. Dassow was advised to call 911 or seek immediate medical attention.  Hypertension: Blood pressure is not well-controlled today and seems to be under suboptimal control at home as well (goal < 130/80).  We will add carvedilol 6.25 mg BID and continue his current doses of amlodipine and lisinopril-HCTZ.   Hyperlipidemia: LDL well controlled on last check in 05/2018 (<70).  We will continue atorvastatin 10 mg daily, though escalation may need to be considered in the future if there is evidence of significant ASCVD.  Follow-up: Return to clinic in 1 month.  Christopher End, MD 12/27/2018 9:15 AM  

## 2018-12-27 ENCOUNTER — Encounter: Payer: Self-pay | Admitting: Internal Medicine

## 2018-12-27 ENCOUNTER — Ambulatory Visit (INDEPENDENT_AMBULATORY_CARE_PROVIDER_SITE_OTHER): Payer: BC Managed Care – PPO | Admitting: Internal Medicine

## 2018-12-27 ENCOUNTER — Other Ambulatory Visit: Payer: Self-pay

## 2018-12-27 VITALS — BP 160/84 | HR 75 | Ht 74.0 in | Wt 212.0 lb

## 2018-12-27 DIAGNOSIS — E785 Hyperlipidemia, unspecified: Secondary | ICD-10-CM | POA: Diagnosis not present

## 2018-12-27 DIAGNOSIS — I10 Essential (primary) hypertension: Secondary | ICD-10-CM | POA: Diagnosis not present

## 2018-12-27 DIAGNOSIS — R079 Chest pain, unspecified: Secondary | ICD-10-CM | POA: Diagnosis not present

## 2018-12-27 MED ORDER — NITROGLYCERIN 0.4 MG SL SUBL: 0.4000 mg | SUBLINGUAL_TABLET | SUBLINGUAL | 3 refills | Status: DC | PRN

## 2018-12-27 MED ORDER — CARVEDILOL 6.25 MG PO TABS: 6.2500 mg | ORAL_TABLET | Freq: Two times a day (BID) | ORAL | 3 refills | Status: DC

## 2018-12-27 NOTE — Patient Instructions (Addendum)
Medication Instructions:  START NTG 0.4 mg SL as needed for Chest Pain START CARVEDILOL 6.25 mg TWICE per DAY If you need a refill on your cardiac medications before your next appointment, please call your pharmacy.   Lab work: NONE If you have labs (blood work) drawn today and your tests are completely normal, you will receive your results only by: Marland Kitchen MyChart Message (if you have MyChart) OR . A paper copy in the mail If you have any lab test that is abnormal or we need to change your treatment, we will call you to review the results.  Testing/Procedures: Your physician has requested that you have a lexiscan myoview. For further information please visit [email or web address]. Please follow instruction sheet, as given.    Follow-Up: At [facility], you and your health needs are our priority.  As part of our continuing mission to provide you with exceptional heart care, we have created designated Provider Care Teams.  These Care Teams include your primary Cardiologist (physician) and Advanced Practice Providers (APPs -  Physician Assistants and Nurse Practitioners) who all work together to provide you with the care you need, when you need it. You will need a follow up appointment in 1 months.  Please call our office 2 months in advance to schedule this appointment.  You may see [name], MD or one of the following Advanced Practice Providers on your designated Care Team:   Murray Hodgkins, NP Christell Faith, PA-C . Marrianne Mood, PA-C  Any Other Special Instructions Will Be Listed Below (If Applicable).   [place]  Your caregiver has ordered a Stress Test with nuclear imaging. The purpose of this test is to evaluate the blood supply to your heart muscle. This procedure is referred to as a "Non-Invasive Stress Test." This is because other than having an IV started in your vein, nothing is inserted or "invades" your body. Cardiac stress tests are done to find areas of poor blood  flow to the heart by determining the extent of coronary artery disease (CAD). Some patients exercise on a treadmill, which naturally increases the blood flow to your heart, while others who are  unable to walk on a treadmill due to physical limitations have a pharmacologic/chemical stress agent called Lexiscan . This medicine will mimic walking on a treadmill by temporarily increasing your coronary blood flow.   Please note: these test may take anywhere between 2-4 hours to complete  PLEASE REPORT TO [place] AT THE FIRST DESK WILL DIRECT YOU WHERE TO GO  Date of Procedure:_____________________________________  Arrival Time for Procedure:______________________________  Instructions regarding medication:   _XX__ : Hold diabetes medication morning of procedure   PLEASE NOTIFY THE OFFICE AT LEAST 24 HOURS IN ADVANCE IF YOU ARE UNABLE TO KEEP YOUR APPOINTMENT.  [phone] AND  PLEASE NOTIFY NUCLEAR MEDICINE AT [facility] AT LEAST 24 HOURS IN ADVANCE IF YOU ARE UNABLE TO KEEP YOUR APPOINTMENT. [phone]  How to prepare for your Myoview test:  1. Do not eat or drink after midnight 2. No caffeine for 24 hours prior to test 3. No smoking 24 hours prior to test. 4. Your medication may be taken with water.  If your doctor stopped a medication because of this test, do not take that medication. 5. Ladies, please do not wear dresses.  Skirts or pants are appropriate. Please wear a short sleeve shirt. 6. No perfume, cologne or lotion. 7. Wear comfortable walking shoes. No heels!

## 2019-01-02 ENCOUNTER — Encounter
Admission: RE | Admit: 2019-01-02 | Discharge: 2019-01-02 | Disposition: A | Discharge status: Home / Self Care | Payer: BC Managed Care – PPO | Source: Ambulatory Visit | Attending: Internal Medicine | Admitting: Internal Medicine

## 2019-01-02 ENCOUNTER — Other Ambulatory Visit: Payer: Self-pay

## 2019-01-02 DIAGNOSIS — R079 Chest pain, unspecified: Secondary | ICD-10-CM | POA: Diagnosis not present

## 2019-01-02 LAB — NM MYOCAR MULTI W/SPECT W/WALL MOTION / EF
Estimated workload: 1 METS
Exercise duration (min): 0 min
Exercise duration (sec): 0 s
LV dias vol: 164 mL (ref 62–150)
LV sys vol: 90 mL
MPHR: 154 {beats}/min
Peak HR: 98 {beats}/min
Percent HR: 63 %
Rest HR: 71 {beats}/min
SDS: 0
SRS: 9
SSS: 6
TID: 1

## 2019-01-02 MED ORDER — TECHNETIUM TC 99M TETROFOSMIN IV KIT
28.2730 | PACK | Freq: Once | INTRAVENOUS | Status: AC | PRN
  Administered 2019-01-02: 28.273 via INTRAVENOUS

## 2019-01-02 MED ORDER — REGADENOSON 0.4 MG/5ML IV SOLN
0.4000 mg | Freq: Once | INTRAVENOUS | Status: AC
  Administered 2019-01-02: 0.4 mg via INTRAVENOUS

## 2019-01-02 MED ORDER — TECHNETIUM TC 99M TETROFOSMIN IV KIT
10.0000 | PACK | Freq: Once | INTRAVENOUS | Status: AC | PRN
  Administered 2019-01-02: 10.8 via INTRAVENOUS

## 2019-01-03 ENCOUNTER — Telehealth: Payer: Self-pay | Admitting: *Deleted

## 2019-01-03 MED ORDER — CARVEDILOL 6.25 MG PO TABS: 3.1250 mg | ORAL_TABLET | Freq: Two times a day (BID) | ORAL | 3 refills | Status: DC

## 2019-01-03 NOTE — Telephone Encounter (Signed)
Spoke with patient.  He verbalized understanding to decrease carvedilol to 3.125 mg two times a day. He will split his 6.25 mg tablet in half and take 0.5 tablet two times a day. He is aware to let us know if symptoms do not improve and to continue to monitor BP/HR.

## 2019-01-03 NOTE — Telephone Encounter (Signed)
Results called to pt. Pt verbalized understanding. He does complain of dizziness, drowsy, and headache for a few hours after taking his carvedilol doses.  He says it is making it difficult for him to work. He's been on it since 12/27/18 visit with Dr End. Patient states he drinks 1-2 bottles of water a day along with decaf coffee in the morning. Reports he does not really drink anything else other than a soda a couple times a week. Encouraged patient to increase his intake of fluid especially water each day. Advised patient to begin checking his BP/HR before carvedilol and about 2 hours after carvedilol for a few days and call us Monday with the readings.  Patient again said it has been very difficult for him to work while he is feeling that way. Routing to Dr End for review.

## 2019-01-03 NOTE — Telephone Encounter (Signed)
-----   Message from [name], MD sent at 01/03/2019  6:57 AM EDT ----- Please let Gabriel Burton know that his stress test is abnormal, though the findings are suggestive of artifact related to his underlying left bundle branch block rather than a significant blockage.  I recommend that we continue with medical therapy, including recently added carvedilol.  We will follow-up as previously recommended and discussed the need for further testing at that time.  If his chest pain worsens or new symptoms develop in the meantime, he should let us know.

## 2019-01-03 NOTE — Telephone Encounter (Signed)
Please have Gabriel Burton cut carvedilol in half to 3.125 mg BID.  If symptoms do not improve in the next few days, he should let us know.  In the meantime, he should also monitor his HR and BP.  [name], MD [facility] Pager: [phone]

## 2019-01-20 ENCOUNTER — Other Ambulatory Visit: Payer: Self-pay | Admitting: Nurse Practitioner

## 2019-01-20 ENCOUNTER — Other Ambulatory Visit: Payer: Self-pay

## 2019-01-20 DIAGNOSIS — E1140 Type 2 diabetes mellitus with diabetic neuropathy, unspecified: Secondary | ICD-10-CM

## 2019-01-20 DIAGNOSIS — E1165 Type 2 diabetes mellitus with hyperglycemia: Secondary | ICD-10-CM

## 2019-01-20 MED ORDER — TOUJEO SOLOSTAR 300 UNIT/ML ~~LOC~~ SOPN: 35.0000 [IU] | PEN_INJECTOR | Freq: Every day | SUBCUTANEOUS | 3 refills | Status: DC

## 2019-01-20 MED ORDER — ATORVASTATIN CALCIUM 10 MG PO TABS: 10.0000 mg | ORAL_TABLET | Freq: Every day | ORAL | 3 refills | Status: DC

## 2019-01-20 MED ORDER — GABAPENTIN 100 MG PO CAPS: 100.0000 mg | ORAL_CAPSULE | Freq: Every day | ORAL | 1 refills | Status: DC

## 2019-01-23 NOTE — Progress Notes (Signed)
Cardiology Office Note Date:  01/27/2019  Patient ID:  Gabriel, Burton 1951-06-25, MRN [id number] PCP:  Ronnell Freshwater, NP  Cardiologist:  Dr. Saunders Revel, MD    Chief Complaint: Follow up  History of Present Illness: Gabriel Burton is a 68 y.o. male with history of chest pain, CKD stage II, DM2, HTN, HLD, and LBBB who presents for follow up of chest pain.   Patient was initially evaluated by Dr. Saunders Revel in 10/2018 for chest pain with preceding ETT being nondiagnostic secondary to baseline LBBB.  Echo from 09/2018 showed normal LV size with mild LVH with normal LVEF, diastolic dysfunction, trace mitral and mild tricuspid regurgitation, normal RV size and systolic function.  At that time, he reported a 3-week history of left sided chest/shoulder/arm pain that would come and go and was noted to be improved with tramadol.  It seemed to be worsened by deep inspiration.  Discussion of Lexiscan Myoview was undertaken through the patient preferred to defer this in favor of medical therapy.  He was most recently seen by Dr. Saunders Revel on 12/27/2018 noting the above symptoms resolved with use of naproxen.  However, he continued to note occasional vague chest discomfort from time to time with location varying and intermittent.  Symptoms were nonexertional and would last for 5 minutes or less.  He was started on Coreg.  In this setting, the patient underwent Lexiscan Myoview on 01/02/2019 which showed no significant ischemia, fixed defect of the septal wall, apical region with hypokinesis of the septal wall with an EF estimated at 43% with perfusion defect and depressed EF possibly secondary to underlying LBBB.  Very mild coronary artery calcification and aortic atherosclerosis noted on CT imaging.  Overall this was a low to moderate risk scan.  He comes in doing well from a cardiac perspective.  When he initially started Coreg 6.25 mg twice a day he noted significant headache, dizziness, and nausea.  This was decreased to  3.125 mg twice a day which the patient tolerated much better.  Over the past 1-1 and half weeks he has subsequently gone back up on his dose to 6.25 mg twice a day and seems to be tolerating this without side effect.  With the addition of Coreg he has noted almost complete resolution of his centralized chest pain.  He does still continue to note intermittent flank/back pain, particularly in the evenings that responds to ibuprofen or tramadol.  Symptoms are not exertional.  There is no associated shortness of breath, palpitations, presyncope, or syncope.  He does have mild chronic left lower extremity swelling which is attributed to a prior fracture.  Otherwise, he denies any abdominal distention, orthopnea, PND, early satiety.  He is not routinely checking his blood pressures at home.  Overall, since getting admitted to [facility] he feels significantly improved and is pleased.  Labs: 11/2018 - A1c 6.9 05/2018 - TSH normal, LDL 62, HGB 14.3, SCr 1.29, K+ 3.8, albumin 4.7, AST/ALT normal   Past Medical History:  Diagnosis Date  . Diabetes mellitus without complication ([employer])   . Hypertension     History reviewed. No pertinent surgical history.  Current Meds  Medication Sig  . amLODipine (NORVASC) 10 MG tablet Take 1 tablet (10 mg total) by mouth daily.  Marland Kitchen aspirin EC 81 MG tablet Take 81 mg by mouth daily.   Marland Kitchen atorvastatin (LIPITOR) 10 MG tablet Take 1 tablet (10 mg total) by mouth at bedtime.  . carvedilol (COREG) 6.25 MG tablet Take  0.5 tablets (3.125 mg total) by mouth 2 (two) times daily. (Patient taking differently: Take 6.25 mg by mouth 2 (two) times daily. )  . chlorpheniramine-HYDROcodone (TUSSIONEX PENNKINETIC ER) 10-8 MG/5ML SUER Take 5 mLs by mouth every 12 (twelve) hours as needed for cough.  . Cholecalciferol (VITAMIN D3) 5000 units TABS Take 5,000 Units by mouth daily.   Marland Kitchen. CINNAMON PO Take by mouth daily.   . fluticasone (FLONASE) 50 MCG/ACT nasal spray Place 2 sprays into both nostrils  daily. (Patient taking differently: Place 2 sprays into both nostrils as needed. )  . gabapentin (NEURONTIN) 100 MG capsule Take 1 capsule (100 mg total) by mouth daily.  . Insulin Pen Needle (BD PEN NEEDLE NANO U/F) 32G X 4 MM MISC USE AS DIRECTED  TWICE  A DAILY DIAG E11.65  . liraglutide (VICTOZA) 18 MG/3ML SOPN Inject 0.3 mLs (1.8 mg total) into the skin daily.  Marland Kitchen. lisinopril-hydrochlorothiazide (ZESTORETIC) 20-12.5 MG tablet Take 2 tab by po once a daily for hypertension  . nitroGLYCERIN (NITROSTAT) 0.4 MG SL tablet Place 1 tablet (0.4 mg total) under the tongue every 5 (five) minutes as needed for chest pain.  . pantoprazole (PROTONIX) 40 MG tablet Take 1 tablet (40 mg total) by mouth daily.  . tadalafil (CIALIS) 10 MG tablet Take 1 tablet (10 mg total) by mouth daily as needed for erectile dysfunction.  . tamsulosin (FLOMAX) 0.4 MG CAPS capsule TAKE 1 CAPSULE BY MOUTH ONE-HALF HOUR AFTER THE SAME MEAL EACH DAY  . TOUJEO SOLOSTAR 300 UNIT/ML SOPN Inject 35 Units into the skin daily.  . traMADol (ULTRAM) 50 MG tablet Take 1 tablet (50 mg total) by mouth every 6 (six) hours as needed.  . vitamin C (ASCORBIC ACID) 500 MG tablet Take 500 mg by mouth daily.     Allergies:   Patient has no known allergies.   Social History:  The patient  reports that he quit smoking about 20 years ago. His smoking use included cigarettes. He has a 3.20 pack-year smoking history. He has never used smokeless tobacco. He reports that he does not drink alcohol or use drugs.   Family History:  The patient's family history includes Heart attack (age of onset: 7642) in his brother; Hypertension in his father and mother.  ROS:   Review of Systems  Constitutional: Positive for malaise/fatigue. Negative for chills, diaphoresis, fever and weight loss.  HENT: Negative for congestion.   Eyes: Negative for discharge and redness.  Respiratory: Negative for cough, hemoptysis, sputum production, shortness of breath and  wheezing.   Cardiovascular: Positive for chest pain. Negative for palpitations, orthopnea, claudication, leg swelling and PND.  Gastrointestinal: Negative for abdominal pain, blood in stool, heartburn, melena, nausea and vomiting.  Genitourinary: Negative for hematuria.  Musculoskeletal: Positive for back pain. Negative for falls and myalgias.  Skin: Negative for rash.  Neurological: Negative for dizziness, tingling, tremors, sensory change, speech change, focal weakness, loss of consciousness and weakness.  Endo/Heme/Allergies: Does not bruise/bleed easily.  Psychiatric/Behavioral: Negative for substance abuse. The patient is not nervous/anxious.   All other systems reviewed and are negative.    PHYSICAL EXAM:  VS:  BP 136/80 (BP Location: Left Arm, Patient Position: Sitting, Cuff Size: Normal)   Pulse 75   Temp (!) 97.3 F (36.3 C)   Ht 6\' 2"  (1.88 m)   Wt 212 lb 12 oz (96.5 kg)   SpO2 96%   BMI 27.32 kg/m  BMI: Body mass index is 27.32 kg/m.  Physical Exam  Cardiovascular:  Pulses:      Posterior tibial pulses are 2+ on the right side and 2+ on the left side.     EKG:  Was ordered and interpreted by me today. Shows NSR, first-degree AV block, 75 bpm, LBBB (known)  Recent Labs: 06/03/2018: ALT 36; BUN 13; Creatinine, Ser 1.29; Hemoglobin 14.3; Platelets 218; Potassium 3.8; Sodium 142; TSH 1.710  06/03/2018: Cholesterol, Total 147; HDL 67; LDL Calculated 62; Triglycerides 89   CrCl cannot be calculated (Patient's most recent lab result is older than the maximum 21 days allowed.).   Wt Readings from Last 3 Encounters:  01/27/19 212 lb 12 oz (96.5 kg)  12/27/18 212 lb (96.2 kg)  12/13/18 214 lb 3.2 oz (97.2 kg)     Other studies reviewed: Additional studies/records reviewed today include: summarized above  ASSESSMENT AND PLAN:  1. Coronary artery calcification with chest pain/aortic atherosclerosis: Currently, chest pain-free.  Recent Lexiscan Myoview did not show any  evidence of significant ischemia with a fixed defect along the septal wall in the apical region with hypokinesis of the septal wall and EF estimated at 43% with the perfusion defect and decreased EF felt to be secondary to underlying left bundle branch block.  There was very mild coronary artery calcification and aortic atherosclerosis noted on CT imaging.  With the addition of Coreg patient has noted significant improvement in his symptoms.  We did discuss continued medical therapy versus follow-up noninvasive imaging such as a coronary CTA.  Patient prefers to continue current medical therapy given significant improvement in symptoms.  We will continue the patient on amlodipine 10 mg daily, Coreg 6.25 mg twice daily, Lipitor, and as needed nitroglycerin.  2. Hypertension: Blood pressure is much better controlled today with the addition of Coreg at his last visit in 12/2018.  Continue amlodipine and Coreg as outlined above as well as lisinopril/HCTZ.  3. Hyperlipidemia: LDL of 62 from 05/2018 with normal liver function at that time, goal LDL less than 70.  Continue Lipitor 10 mg daily.  Disposition: F/u with Dr. Okey DupreEnd or an APP in 5 to 6 months, sooner if needed.  Current medicines are reviewed at length with the patient today.  The patient did not have any concerns regarding medicines.  Signed, Eula Listenyan Othell Jaime, PA-C 01/27/2019 11:02 AM     [facility] [place] [address] [place], [place] [address] [phone]

## 2019-01-27 ENCOUNTER — Ambulatory Visit (INDEPENDENT_AMBULATORY_CARE_PROVIDER_SITE_OTHER): Payer: BC Managed Care – PPO | Admitting: Physician Assistant

## 2019-01-27 ENCOUNTER — Encounter: Payer: Self-pay | Admitting: Physician Assistant

## 2019-01-27 ENCOUNTER — Other Ambulatory Visit: Payer: Self-pay

## 2019-01-27 VITALS — BP 136/80 | HR 75 | Temp 97.3°F | Ht 74.0 in | Wt 212.8 lb

## 2019-01-27 DIAGNOSIS — I10 Essential (primary) hypertension: Secondary | ICD-10-CM | POA: Diagnosis not present

## 2019-01-27 DIAGNOSIS — I2510 Atherosclerotic heart disease of native coronary artery without angina pectoris: Secondary | ICD-10-CM

## 2019-01-27 DIAGNOSIS — I2584 Coronary atherosclerosis due to calcified coronary lesion: Secondary | ICD-10-CM

## 2019-01-27 DIAGNOSIS — R0789 Other chest pain: Secondary | ICD-10-CM | POA: Diagnosis not present

## 2019-01-27 DIAGNOSIS — I700 Atherosclerosis of aorta: Secondary | ICD-10-CM

## 2019-01-27 DIAGNOSIS — E785 Hyperlipidemia, unspecified: Secondary | ICD-10-CM

## 2019-01-27 NOTE — Patient Instructions (Signed)
Medication Instructions:  - Your physician recommends that you continue on your current medications as directed. Please refer to the Current Medication list given to you today.  If you need a refill on your cardiac medications before your next appointment, please call your pharmacy.   Lab work: - none ordered  If you have labs (blood work) drawn today and your tests are completely normal, you will receive your results only by: Marland Kitchen MyChart Message (if you have MyChart) OR . A paper copy in the mail If you have any lab test that is abnormal or we need to change your treatment, we will call you to review the results.  Testing/Procedures: - none ordered  Follow-Up: At [facility], you and your health needs are our priority.  As part of our continuing mission to provide you with exceptional heart care, we have created designated Provider Care Teams.  These Care Teams include your primary Cardiologist (physician) and Advanced Practice Providers (APPs -  Physician Assistants and Nurse Practitioners) who all work together to provide you with the care you need, when you need it.  You will need a follow up appointment in 5 months (December)   Please call our office 2 months in advance to schedule this appointment. (Call in early October to schedule)  You may see [name], MD or one of the following Advanced Practice Providers on your designated Care Team:   Murray Hodgkins, NP Christell Faith, PA-C . Marrianne Mood, PA-C  Any Other Special Instructions Will Be Listed Below (If Applicable). - N/A

## 2019-02-12 ENCOUNTER — Other Ambulatory Visit: Payer: Self-pay

## 2019-02-13 ENCOUNTER — Other Ambulatory Visit: Payer: Self-pay

## 2019-02-13 DIAGNOSIS — N529 Male erectile dysfunction, unspecified: Secondary | ICD-10-CM

## 2019-02-14 ENCOUNTER — Other Ambulatory Visit: Payer: Self-pay

## 2019-02-14 DIAGNOSIS — N529 Male erectile dysfunction, unspecified: Secondary | ICD-10-CM

## 2019-02-14 MED ORDER — TADALAFIL 10 MG PO TABS: 10.0000 mg | ORAL_TABLET | Freq: Every day | ORAL | 2 refills | Status: DC | PRN

## 2019-02-19 ENCOUNTER — Other Ambulatory Visit: Payer: Self-pay

## 2019-02-19 MED ORDER — AMLODIPINE BESYLATE 10 MG PO TABS: 10.0000 mg | ORAL_TABLET | Freq: Every day | ORAL | 5 refills | Status: DC

## 2019-03-13 ENCOUNTER — Other Ambulatory Visit: Payer: Self-pay

## 2019-03-13 ENCOUNTER — Ambulatory Visit: Payer: BC Managed Care – PPO | Admitting: Nurse Practitioner

## 2019-03-13 ENCOUNTER — Encounter: Payer: Self-pay | Admitting: Nurse Practitioner

## 2019-03-13 VITALS — BP 153/87 | HR 73 | Resp 16 | Ht 74.0 in | Wt 214.0 lb

## 2019-03-13 DIAGNOSIS — E1165 Type 2 diabetes mellitus with hyperglycemia: Secondary | ICD-10-CM

## 2019-03-13 DIAGNOSIS — I10 Essential (primary) hypertension: Secondary | ICD-10-CM

## 2019-03-13 DIAGNOSIS — J0140 Acute pansinusitis, unspecified: Secondary | ICD-10-CM | POA: Diagnosis not present

## 2019-03-13 DIAGNOSIS — J300 Vasomotor rhinitis: Secondary | ICD-10-CM | POA: Diagnosis not present

## 2019-03-13 DIAGNOSIS — R079 Chest pain, unspecified: Secondary | ICD-10-CM

## 2019-03-13 LAB — POCT GLYCOSYLATED HEMOGLOBIN (HGB A1C): Hemoglobin A1C: 6.8 % — AB (ref 4.0–5.6)

## 2019-03-13 MED ORDER — FLUTICASONE PROPIONATE 50 MCG/ACT NA SUSP: 2.0000 | Freq: Every day | NASAL | 6 refills | Status: DC

## 2019-03-13 MED ORDER — AMOXICILLIN 875 MG PO TABS: 875.0000 mg | ORAL_TABLET | Freq: Two times a day (BID) | ORAL | 0 refills | Status: DC

## 2019-03-13 MED ORDER — PANTOPRAZOLE SODIUM 40 MG PO TBEC: 40.0000 mg | DELAYED_RELEASE_TABLET | Freq: Every day | ORAL | 5 refills | Status: DC

## 2019-03-13 NOTE — Progress Notes (Signed)
[facility] [address] [place], [place] [address]  Internal MEDICINE  Office Visit Note  Patient Name: Gabriel Burton  [id number]/04/2051  [id number]  Date of Service: 03/16/2019  Chief Complaint  Patient presents with  . Hypertension  . Diabetes  . Quality Metric Gaps    PNA vacc  . Nasal Congestion    The patient is here for regular follow up visit. Blood sugars are doing well. HgbA1c is 6.8 today. He continues to see cardiology for issues with intermittent chest pain and hypertension. Today, he is complaining of nasal congestion, frontal headache, and ears feeling clogged. He denies fever or chills. He denies nausea and vomiting.       Current Medication: Outpatient Encounter Medications as of 03/13/2019  Medication Sig  . amLODipine (NORVASC) 10 MG tablet Take 1 tablet (10 mg total) by mouth daily.  Marland Kitchen. aspirin EC 81 MG tablet Take 81 mg by mouth daily.   Marland Kitchen. atorvastatin (LIPITOR) 10 MG tablet Take 1 tablet (10 mg total) by mouth at bedtime.  . carvedilol (COREG) 6.25 MG tablet Take 6.25 mg by mouth 2 (two) times daily with a meal.  . Cholecalciferol (VITAMIN D3) 5000 units TABS Take 5,000 Units by mouth daily.   Marland Kitchen. CINNAMON PO Take by mouth daily.   . fluticasone (FLONASE) 50 MCG/ACT nasal spray Place 2 sprays into both nostrils daily.  Marland Kitchen. gabapentin (NEURONTIN) 100 MG capsule Take 1 capsule (100 mg total) by mouth daily.  . Insulin Pen Needle (BD PEN NEEDLE NANO U/F) 32G X 4 MM MISC USE AS DIRECTED  TWICE  A DAILY DIAG E11.65  . liraglutide (VICTOZA) 18 MG/3ML SOPN Inject 0.3 mLs (1.8 mg total) into the skin daily.  Marland Kitchen. lisinopril-hydrochlorothiazide (ZESTORETIC) 20-12.5 MG tablet Take 2 tab by po once a daily for hypertension  . nitroGLYCERIN (NITROSTAT) 0.4 MG SL tablet Place 1 tablet (0.4 mg total) under the tongue every 5 (five) minutes as needed for chest pain.  . pantoprazole (PROTONIX) 40 MG tablet Take 1 tablet (40 mg total) by mouth daily.  . tadalafil (CIALIS)  10 MG tablet Take 1 tablet (10 mg total) by mouth daily as needed for erectile dysfunction.  . tamsulosin (FLOMAX) 0.4 MG CAPS capsule TAKE 1 CAPSULE BY MOUTH ONE-HALF HOUR AFTER THE SAME MEAL EACH DAY  . TOUJEO SOLOSTAR 300 UNIT/ML SOPN Inject 35 Units into the skin daily.  . traMADol (ULTRAM) 50 MG tablet Take 1 tablet (50 mg total) by mouth every 6 (six) hours as needed.  . vitamin C (ASCORBIC ACID) 500 MG tablet Take 500 mg by mouth daily.   . [DISCONTINUED] chlorpheniramine-HYDROcodone (TUSSIONEX PENNKINETIC ER) 10-8 MG/5ML SUER Take 5 mLs by mouth every 12 (twelve) hours as needed for cough.  . [DISCONTINUED] fluticasone (FLONASE) 50 MCG/ACT nasal spray Place 2 sprays into both nostrils daily. (Patient taking differently: Place 2 sprays into both nostrils as needed. )  . amoxicillin (AMOXIL) 875 MG tablet Take 1 tablet (875 mg total) by mouth 2 (two) times daily.   No facility-administered encounter medications on file as of 03/13/2019.     Surgical History: History reviewed. No pertinent surgical history.  Medical History: Past Medical History:  Diagnosis Date  . Diabetes mellitus without complication (HCC)   . Hypertension     Family History: Family History  Problem Relation Age of Onset  . Hypertension Mother   . Hypertension Father   . Heart attack Brother 7542    Social History   Socioeconomic  History  . Marital status: Widowed    Spouse name: Not on file  . Number of children: Not on file  . Years of education: Not on file  . Highest education level: Not on file  Occupational History  . Not on file  Social Needs  . Financial resource strain: Not on file  . Food insecurity    Worry: Not on file    Inability: Not on file  . Transportation needs    Medical: Not on file    Non-medical: Not on file  Tobacco Use  . Smoking status: Former Smoker    Packs/day: 0.40    Years: 8.00    Pack years: 3.20    Types: Cigarettes    Quit date: 2000    Years since  quitting: 20.6  . Smokeless tobacco: Never Used  Substance and Sexual Activity  . Alcohol use: Never    Frequency: Never  . Drug use: Never  . Sexual activity: Not on file  Lifestyle  . Physical activity    Days per week: Not on file    Minutes per session: Not on file  . Stress: Not on file  Relationships  . Social Herbalist on phone: Not on file    Gets together: Not on file    Attends religious service: Not on file    Active member of club or organization: Not on file    Attends meetings of clubs or organizations: Not on file    Relationship status: Not on file  . Intimate partner violence    Fear of current or ex partner: Not on file    Emotionally abused: Not on file    Physically abused: Not on file    Forced sexual activity: Not on file  Other Topics Concern  . Not on file  Social History Narrative  . Not on file      Review of Systems  Constitutional: Positive for fatigue. Negative for chills, fever and unexpected weight change.  HENT: Positive for congestion, ear pain, postnasal drip, rhinorrhea and sinus pressure. Negative for sneezing, sore throat and voice change.   Respiratory: Negative for cough, chest tightness, shortness of breath and wheezing.   Cardiovascular: Negative for chest pain and palpitations.  Gastrointestinal: Negative for abdominal pain, constipation, diarrhea, nausea and vomiting.  Endocrine: Negative for cold intolerance, heat intolerance, polydipsia and polyuria.       Blood sugars doing well   Musculoskeletal: Negative for arthralgias, back pain, joint swelling and neck pain.  Skin: Negative for rash.  Allergic/Immunologic: Positive for environmental allergies.  Neurological: Negative for dizziness, tremors, numbness and headaches.  Hematological: Negative for adenopathy. Does not bruise/bleed easily.  Psychiatric/Behavioral: Negative for behavioral problems (Depression), sleep disturbance and suicidal ideas. The patient is not  nervous/anxious.     Today's Vitals   03/13/19 1128  BP: (!) 153/87  Pulse: 73  Resp: 16  SpO2: 99%  Weight: 214 lb (97.1 kg)  Height: 6\' 2"  (1.88 m)   Body mass index is 27.48 kg/m.  Physical Exam Vitals signs and nursing note reviewed.  Constitutional:      General: He is not in acute distress.    Appearance: Normal appearance. He is well-developed. He is not diaphoretic.  HENT:     Head: Normocephalic and atraumatic.     Right Ear: Tympanic membrane is erythematous and bulging.     Left Ear: Tympanic membrane is erythematous and bulging.     Nose: Congestion present.  Right Turbinates: Swollen.     Left Turbinates: Swollen.     Right Sinus: Frontal sinus tenderness present.     Left Sinus: Frontal sinus tenderness present.     Mouth/Throat:     Pharynx: No oropharyngeal exudate.  Eyes:     Pupils: Pupils are equal, round, and reactive to light.  Neck:     Musculoskeletal: Normal range of motion and neck supple.     Thyroid: No thyromegaly.     Vascular: No carotid bruit or JVD.     Trachea: No tracheal deviation.  Cardiovascular:     Rate and Rhythm: Normal rate and regular rhythm.     Heart sounds: Normal heart sounds. No murmur. No friction rub. No gallop.   Pulmonary:     Effort: Pulmonary effort is normal. No respiratory distress.     Breath sounds: Normal breath sounds. No wheezing or rales.  Chest:     Chest wall: No tenderness.  Abdominal:     General: Bowel sounds are normal.     Palpations: Abdomen is soft.     Tenderness: There is no abdominal tenderness.  Musculoskeletal: Normal range of motion.  Lymphadenopathy:     Cervical: No cervical adenopathy.  Skin:    General: Skin is warm and dry.  Neurological:     Mental Status: He is alert and oriented to person, place, and time.     Cranial Nerves: No cranial nerve deficit.  Psychiatric:        Behavior: Behavior normal.        Thought Content: Thought content normal.        Judgment:  Judgment normal.   Assessment/Plan: 1. Type 2 diabetes mellitus with hyperglycemia, unspecified whether long term insulin use (HCC) - POCT HgB A1C 6.8 today. Continue all diabetic medication as prescribed.   2. Acute non-recurrent pansinusitis Start amoxicillin 875mg  twice daily for 10 days. Rest and increase fluids. Use OTC medications as needed  - amoxicillin (AMOXIL) 875 MG tablet; Take 1 tablet (875 mg total) by mouth 2 (two) times daily.  Dispense: 20 tablet; Refill: 0  3. Vasomotor rhinitis Recommended using flonase nasal spray daily. Refills sent to his pharmacy today.  - fluticasone (FLONASE) 50 MCG/ACT nasal spray; Place 2 sprays into both nostrils daily.  Dispense: 16 g; Refill: 6  4. Essential hypertension Generally stable. Continue bp medication as prescribed   5. Chest pain, unspecified type Continue regular visits with cardiology as scheduled.   General Counseling: Ty verbalizes understanding of the findings of todays visit and agrees with plan of treatment. I have discussed any further diagnostic evaluation that may be needed or ordered today. We also reviewed his medications today. he has been encouraged to call the office with any questions or concerns that should arise related to todays visit.  Diabetes Counseling:  1. Addition of ACE inh/ ARB'S for nephroprotection. Microalbumin is updated  2. Diabetic foot care, prevention of complications. Podiatry consult 3. Exercise and lose weight.  4. Diabetic eye examination, Diabetic eye exam is updated  5. Monitor blood sugar closlely. nutrition counseling.  6. Sign and symptoms of hypoglycemia including shaking sweating,confusion and headaches.  This patient was seen by Vincent Gros FNP Collaboration with Dr Lyndon Code as a part of collaborative care agreement  Orders Placed This Encounter  Procedures  . POCT HgB A1C    Meds ordered this encounter  Medications  . amoxicillin (AMOXIL) 875 MG tablet    Sig:  Take 1 tablet (  875 mg total) by mouth 2 (two) times daily.    Dispense:  20 tablet    Refill:  0    Order Specific Question:   Supervising Provider    Answer:   Lyndon CodeKHAN, FOZIA M [[id number]]  . fluticasone (FLONASE) 50 MCG/ACT nasal spray    Sig: Place 2 sprays into both nostrils daily.    Dispense:  16 g    Refill:  6    Order Specific Question:   Supervising Provider    Answer:   Lyndon CodeKHAN, FOZIA M [[id number]]    Time spent: 6525 Minutes      Dr Lyndon CodeFozia M Khan Internal medicine

## 2019-03-14 ENCOUNTER — Ambulatory Visit: Payer: Self-pay | Admitting: Nurse Practitioner

## 2019-04-04 ENCOUNTER — Other Ambulatory Visit: Payer: Self-pay

## 2019-04-04 MED ORDER — VICTOZA 18 MG/3ML ~~LOC~~ SOPN: 1.8000 mg | PEN_INJECTOR | Freq: Every day | SUBCUTANEOUS | 5 refills | Status: DC

## 2019-06-04 ENCOUNTER — Telehealth: Payer: Self-pay

## 2019-06-04 NOTE — Telephone Encounter (Signed)
PATIENT REQUESTED TO RS CPE SCHEDULED FOR 06-06-19 DUE TO WORK OBLIGATIONS. ITS BEEN RESCHEDULED FOR 07-10-19. MADE A VIRTUAL ACUTE OV FOR 06-05-19 FOR POSS SINUS INFECTION.

## 2019-06-05 ENCOUNTER — Encounter: Payer: Self-pay | Admitting: Adult Health

## 2019-06-05 ENCOUNTER — Other Ambulatory Visit: Payer: Self-pay

## 2019-06-05 ENCOUNTER — Ambulatory Visit: Payer: BC Managed Care – PPO | Admitting: Adult Health

## 2019-06-05 DIAGNOSIS — E1165 Type 2 diabetes mellitus with hyperglycemia: Secondary | ICD-10-CM

## 2019-06-05 DIAGNOSIS — J320 Chronic maxillary sinusitis: Secondary | ICD-10-CM | POA: Diagnosis not present

## 2019-06-05 DIAGNOSIS — I10 Essential (primary) hypertension: Secondary | ICD-10-CM | POA: Diagnosis not present

## 2019-06-05 MED ORDER — AMOXICILLIN-POT CLAVULANATE 875-125 MG PO TABS: 1.0000 | ORAL_TABLET | Freq: Two times a day (BID) | ORAL | 0 refills | Status: DC

## 2019-06-05 NOTE — Progress Notes (Signed)
[facility] [address] [place], [place] [address]  Internal MEDICINE  Telephone Visit  Patient Name: Gabriel Burton  [id number]  [id number]  Date of Service: 06/05/2019  I connected with the patient at [phone] by telephone and verified the patients identity using two identifiers.   I discussed the limitations, risks, security and privacy concerns of performing an evaluation and management service by telephone and the availability of in person appointments. I also discussed with the patient that there may be a patient responsible charge related to the service.  The patient expressed understanding and agrees to proceed.    Chief Complaint  Patient presents with  . Telephone Assessment  . Telephone Screen  . Sinusitis    going for 2 days   . Cough    HPI  Pt seen via telephone.  He reports a few days of sinus congestion and pain.  He reports body aches, night sweats. He also had chills. He does report ear fullness and pressure at this time.  He has had a mild headache, but mostly describes "full" feeling.     Current Medication: Outpatient Encounter Medications as of 06/05/2019  Medication Sig  . amLODipine (NORVASC) 10 MG tablet Take 1 tablet (10 mg total) by mouth daily.  Marland Kitchen aspirin EC 81 MG tablet Take 81 mg by mouth daily.   Marland Kitchen atorvastatin (LIPITOR) 10 MG tablet Take 1 tablet (10 mg total) by mouth at bedtime.  . carvedilol (COREG) 6.25 MG tablet Take 6.25 mg by mouth 2 (two) times daily with a meal.  . Cholecalciferol (VITAMIN D3) 5000 units TABS Take 5,000 Units by mouth daily.   Marland Kitchen CINNAMON PO Take by mouth daily.   . fluticasone (FLONASE) 50 MCG/ACT nasal spray Place 2 sprays into both nostrils daily.  Marland Kitchen gabapentin (NEURONTIN) 100 MG capsule Take 1 capsule (100 mg total) by mouth daily.  . Insulin Pen Needle (BD PEN NEEDLE NANO U/F) 32G X 4 MM MISC USE AS DIRECTED  TWICE  A DAILY DIAG E11.65  . liraglutide (VICTOZA) 18 MG/3ML SOPN Inject 0.3 mLs (1.8 mg total)  into the skin daily.  Marland Kitchen lisinopril-hydrochlorothiazide (ZESTORETIC) 20-12.5 MG tablet Take 2 tab by po once a daily for hypertension  . pantoprazole (PROTONIX) 40 MG tablet Take 1 tablet (40 mg total) by mouth daily.  . tadalafil (CIALIS) 10 MG tablet Take 1 tablet (10 mg total) by mouth daily as needed for erectile dysfunction.  . tamsulosin (FLOMAX) 0.4 MG CAPS capsule TAKE 1 CAPSULE BY MOUTH ONE-HALF HOUR AFTER THE SAME MEAL EACH DAY  . TOUJEO SOLOSTAR 300 UNIT/ML SOPN Inject 35 Units into the skin daily.  . traMADol (ULTRAM) 50 MG tablet Take 1 tablet (50 mg total) by mouth every 6 (six) hours as needed.  . vitamin C (ASCORBIC ACID) 500 MG tablet Take 500 mg by mouth daily.   Marland Kitchen amoxicillin (AMOXIL) 875 MG tablet Take 1 tablet (875 mg total) by mouth 2 (two) times daily. (Patient not taking: Reported on 06/05/2019)  . amoxicillin-clavulanate (AUGMENTIN) 875-125 MG tablet Take 1 tablet by mouth 2 (two) times daily.  . nitroGLYCERIN (NITROSTAT) 0.4 MG SL tablet Place 1 tablet (0.4 mg total) under the tongue every 5 (five) minutes as needed for chest pain.   No facility-administered encounter medications on file as of 06/05/2019.     Surgical History: History reviewed. No pertinent surgical history.  Medical History: Past Medical History:  Diagnosis Date  . Diabetes mellitus without complication (HCC)   .  Hypertension     Family History: Family History  Problem Relation Age of Onset  . Hypertension Mother   . Hypertension Father   . Heart attack Brother 51    Social History   Socioeconomic History  . Marital status: Widowed    Spouse name: Not on file  . Number of children: Not on file  . Years of education: Not on file  . Highest education level: Not on file  Occupational History  . Not on file  Social Needs  . Financial resource strain: Not on file  . Food insecurity    Worry: Not on file    Inability: Not on file  . Transportation needs    Medical: Not on file     Non-medical: Not on file  Tobacco Use  . Smoking status: Former Smoker    Packs/day: 0.40    Years: 8.00    Pack years: 3.20    Types: Cigarettes    Quit date: 2000    Years since quitting: 20.8  . Smokeless tobacco: Never Used  Substance and Sexual Activity  . Alcohol use: Never    Frequency: Never  . Drug use: Never  . Sexual activity: Not on file  Lifestyle  . Physical activity    Days per week: Not on file    Minutes per session: Not on file  . Stress: Not on file  Relationships  . Social Herbalist on phone: Not on file    Gets together: Not on file    Attends religious service: Not on file    Active member of club or organization: Not on file    Attends meetings of clubs or organizations: Not on file    Relationship status: Not on file  . Intimate partner violence    Fear of current or ex partner: Not on file    Emotionally abused: Not on file    Physically abused: Not on file    Forced sexual activity: Not on file  Other Topics Concern  . Not on file  Social History Narrative  . Not on file      Review of Systems  Constitutional: Negative.  Negative for chills, fatigue and unexpected weight change.  HENT: Positive for congestion, postnasal drip, sinus pressure and sinus pain. Negative for rhinorrhea, sneezing and sore throat.   Eyes: Negative for redness.  Respiratory: Positive for cough. Negative for chest tightness and shortness of breath.   Cardiovascular: Negative.  Negative for chest pain and palpitations.  Gastrointestinal: Negative.  Negative for abdominal pain, constipation, diarrhea, nausea and vomiting.  Endocrine: Negative.   Genitourinary: Negative.  Negative for dysuria and frequency.  Musculoskeletal: Negative.  Negative for arthralgias, back pain, joint swelling and neck pain.  Skin: Negative.  Negative for rash.  Allergic/Immunologic: Negative.   Neurological: Negative.  Negative for tremors and numbness.  Hematological: Negative  for adenopathy. Does not bruise/bleed easily.  Psychiatric/Behavioral: Negative.  Negative for behavioral problems, sleep disturbance and suicidal ideas. The patient is not nervous/anxious.     Vital Signs: There were no vitals taken for this visit.   Observation/Objective:  Well sounding, NAD noted.    Assessment/Plan: 1. Maxillary sinusitis, unspecified chronicity Advised patient to take entire course of antibiotics as prescribed with food. Pt should return to clinic in 7-10 days if symptoms fail to improve or new symptoms develop.  - amoxicillin-clavulanate (AUGMENTIN) 875-125 MG tablet; Take 1 tablet by mouth 2 (two) times daily.  Dispense: 14 tablet;  Refill: 0  2. Type 2 diabetes mellitus with hyperglycemia, unspecified whether long term insulin use (HCC) Stable, continue current management.  3. Essential hypertension Pt reports bp is controlled, will follow up in office as scheduled.   General Counseling: Miken verbalizes understanding of the findings of today's phone visit and agrees with plan of treatment. I have discussed any further diagnostic evaluation that may be needed or ordered today. We also reviewed his medications today. he has been encouraged to call the office with any questions or concerns that should arise related to todays visit.    No orders of the defined types were placed in this encounter.   Meds ordered this encounter  Medications  . amoxicillin-clavulanate (AUGMENTIN) 875-125 MG tablet    Sig: Take 1 tablet by mouth 2 (two) times daily.    Dispense:  14 tablet    Refill:  0    Time spent: 15 Minutes    Blima LedgerAdam Yuliet Needs AGNP-C Internal medicine

## 2019-06-06 ENCOUNTER — Encounter: Payer: Self-pay | Admitting: Nurse Practitioner

## 2019-06-10 ENCOUNTER — Encounter: Payer: Self-pay | Admitting: Adult Health

## 2019-06-16 ENCOUNTER — Other Ambulatory Visit: Payer: Self-pay

## 2019-07-03 ENCOUNTER — Telehealth: Payer: Self-pay

## 2019-07-03 NOTE — Telephone Encounter (Signed)
PATIENT ORIGINALLY SCHEDULED FOR 07-10-19 AND DUE TO THE HOLIDAY WANTED AN EARLIER DATED APPT.

## 2019-07-04 ENCOUNTER — Ambulatory Visit (INDEPENDENT_AMBULATORY_CARE_PROVIDER_SITE_OTHER): Payer: BC Managed Care – PPO | Admitting: Nurse Practitioner

## 2019-07-04 ENCOUNTER — Encounter: Payer: Self-pay | Admitting: Nurse Practitioner

## 2019-07-04 ENCOUNTER — Other Ambulatory Visit: Payer: Self-pay

## 2019-07-04 VITALS — BP 152/89 | HR 74 | Temp 97.3°F | Resp 16 | Ht 74.0 in | Wt 211.0 lb

## 2019-07-04 DIAGNOSIS — N529 Male erectile dysfunction, unspecified: Secondary | ICD-10-CM | POA: Diagnosis not present

## 2019-07-04 DIAGNOSIS — I10 Essential (primary) hypertension: Secondary | ICD-10-CM

## 2019-07-04 DIAGNOSIS — E1165 Type 2 diabetes mellitus with hyperglycemia: Secondary | ICD-10-CM

## 2019-07-04 DIAGNOSIS — R079 Chest pain, unspecified: Secondary | ICD-10-CM | POA: Diagnosis not present

## 2019-07-04 DIAGNOSIS — Z1159 Encounter for screening for other viral diseases: Secondary | ICD-10-CM | POA: Insufficient documentation

## 2019-07-04 DIAGNOSIS — Z23 Encounter for immunization: Secondary | ICD-10-CM | POA: Insufficient documentation

## 2019-07-04 DIAGNOSIS — Z0001 Encounter for general adult medical examination with abnormal findings: Secondary | ICD-10-CM | POA: Diagnosis not present

## 2019-07-04 DIAGNOSIS — R300 Dysuria: Secondary | ICD-10-CM

## 2019-07-04 LAB — POCT GLYCOSYLATED HEMOGLOBIN (HGB A1C): Hemoglobin A1C: 7 % — AB (ref 4.0–5.6)

## 2019-07-04 NOTE — Progress Notes (Signed)
[facility] [address] [place], [place] [id number]  Internal MEDICINE  Office Visit Note  Patient Name: Gabriel Burton  [id number]  [id number]  Date of Service: 07/04/2019   Pt is here for routine health maintenance examination  Chief Complaint  Patient presents with  . Annual Exam  . Diabetes  . Hypertension     The patient presents for health maintenance exam. He states that he is doing well. Blood sugar is stable. HgbA1 c is 7.0 today. Blood pressure is mildly elevated. States that his cardiologist recently added nitrostat to medications. He takes this as needed for severely elevated blood pressure or episodes of chest pain. He continues to follow up with cardiology regularly. He was recently treated for sinusitis. He has completed antibiotics and feels much better. He has no new concerns or complaints today.     Current Medication: Outpatient Encounter Medications as of 07/04/2019  Medication Sig  . amLODipine (NORVASC) 10 MG tablet Take 1 tablet (10 mg total) by mouth daily.  Marland Kitchen. amoxicillin-clavulanate (AUGMENTIN) 875-125 MG tablet Take 1 tablet by mouth 2 (two) times daily.  Marland Kitchen. aspirin EC 81 MG tablet Take 81 mg by mouth daily.   Marland Kitchen. atorvastatin (LIPITOR) 10 MG tablet Take 1 tablet (10 mg total) by mouth at bedtime.  . carvedilol (COREG) 6.25 MG tablet Take 6.25 mg by mouth 2 (two) times daily with a meal.  . Cholecalciferol (VITAMIN D3) 5000 units TABS Take 5,000 Units by mouth daily.   Marland Kitchen. CINNAMON PO Take by mouth daily.   . fluticasone (FLONASE) 50 MCG/ACT nasal spray Place 2 sprays into both nostrils daily.  Marland Kitchen. gabapentin (NEURONTIN) 100 MG capsule Take 1 capsule (100 mg total) by mouth daily.  . Insulin Pen Needle (BD PEN NEEDLE NANO U/F) 32G X 4 MM MISC USE AS DIRECTED  TWICE  A DAILY DIAG E11.65  . liraglutide (VICTOZA) 18 MG/3ML SOPN Inject 0.3 mLs (1.8 mg total) into the skin daily.  Marland Kitchen. lisinopril-hydrochlorothiazide (ZESTORETIC) 20-12.5 MG tablet Take  2 tab by po once a daily for hypertension  . pantoprazole (PROTONIX) 40 MG tablet Take 1 tablet (40 mg total) by mouth daily.  . tadalafil (CIALIS) 10 MG tablet Take 1 tablet (10 mg total) by mouth daily as needed for erectile dysfunction.  . tamsulosin (FLOMAX) 0.4 MG CAPS capsule TAKE 1 CAPSULE BY MOUTH ONE-HALF HOUR AFTER THE SAME MEAL EACH DAY  . TOUJEO SOLOSTAR 300 UNIT/ML SOPN Inject 35 Units into the skin daily.  . traMADol (ULTRAM) 50 MG tablet Take 1 tablet (50 mg total) by mouth every 6 (six) hours as needed.  . vitamin C (ASCORBIC ACID) 500 MG tablet Take 500 mg by mouth daily.   . nitroGLYCERIN (NITROSTAT) 0.4 MG SL tablet Place 1 tablet (0.4 mg total) under the tongue every 5 (five) minutes as needed for chest pain.  . [DISCONTINUED] amoxicillin (AMOXIL) 875 MG tablet Take 1 tablet (875 mg total) by mouth 2 (two) times daily. (Patient not taking: Reported on 06/05/2019)   No facility-administered encounter medications on file as of 07/04/2019.    Surgical History: History reviewed. No pertinent surgical history.  Medical History: Past Medical History:  Diagnosis Date  . Diabetes mellitus without complication (HCC)   . Hypertension     Family History: Family History  Problem Relation Age of Onset  . Hypertension Mother   . Hypertension Father   . Heart attack Brother 42      Review of Systems  Constitutional:  Negative for activity change, chills, fatigue and unexpected weight change.  HENT: Negative for congestion, postnasal drip, rhinorrhea, sinus pressure, sneezing, sore throat and voice change.   Respiratory: Negative for cough, chest tightness and shortness of breath.   Cardiovascular: Negative for chest pain and palpitations.       Bp mildly elevated today  Gastrointestinal: Negative for abdominal pain, constipation, diarrhea, nausea and vomiting.  Endocrine: Negative for cold intolerance, heat intolerance, polydipsia and polyuria.       Blood sugars doing  well   Genitourinary:       Does have some issues with erectile dysfunction. Taking cialis as needed has helped more than anything with this.   Musculoskeletal: Negative for arthralgias, back pain, joint swelling and neck pain.  Skin: Negative for rash.  Allergic/Immunologic: Positive for environmental allergies.  Neurological: Negative for dizziness, tremors, numbness and headaches.  Hematological: Negative for adenopathy. Does not bruise/bleed easily.  Psychiatric/Behavioral: Negative for behavioral problems (Depression), sleep disturbance and suicidal ideas. The patient is not nervous/anxious.      Today's Vitals   07/04/19 1046  BP: (!) 152/89  Pulse: 74  Resp: 16  Temp: (!) 97.3 F (36.3 C)  SpO2: 98%  Weight: 211 lb (95.7 kg)  Height:  (1.88 m)   Body mass index is 27.09 kg/m.  Physical Exam Vitals and nursing note reviewed.  Constitutional:      General: He is not in acute distress.    Appearance: Normal appearance. He is well-developed. He is not diaphoretic.  HENT:     Head: Normocephalic and atraumatic.     Nose: Nose normal.     Mouth/Throat:     Pharynx: No oropharyngeal exudate.  Eyes:     Extraocular Movements: Extraocular movements intact.     Conjunctiva/sclera: Conjunctivae normal.     Pupils: Pupils are equal, round, and reactive to light.  Neck:     Thyroid: No thyromegaly.     Vascular: No carotid bruit or JVD.     Trachea: No tracheal deviation.  Cardiovascular:     Rate and Rhythm: Normal rate and regular rhythm.     Pulses: Normal pulses.          Dorsalis pedis pulses are 2+ on the right side and 2+ on the left side.       Posterior tibial pulses are 2+ on the right side and 2+ on the left side.     Heart sounds: Normal heart sounds. No murmur. No friction rub. No gallop.   Pulmonary:     Effort: Pulmonary effort is normal. No respiratory distress.     Breath sounds: Normal breath sounds. No wheezing or rales.  Chest:     Chest wall:  No tenderness.  Abdominal:     General: Bowel sounds are normal.     Palpations: Abdomen is soft.     Tenderness: There is no abdominal tenderness.  Musculoskeletal:        General: Normal range of motion.     Cervical back: Normal range of motion and neck supple.     Right foot: Normal range of motion. No deformity or bunion.     Left foot: Normal range of motion. No deformity or bunion.  Feet:     Right foot:     Protective Sensation: 10 sites tested. 10 sites sensed.     Skin integrity: Skin integrity normal.     Toenail Condition: Right toenails are normal.     Left foot:  Protective Sensation: 10 sites tested. 10 sites sensed.     Skin integrity: Skin integrity normal.     Toenail Condition: Left toenails are normal.  Lymphadenopathy:     Cervical: No cervical adenopathy.  Skin:    General: Skin is warm and dry.  Neurological:     General: No focal deficit present.     Mental Status: He is alert and oriented to person, place, and time.     Cranial Nerves: No cranial nerve deficit.  Psychiatric:        Behavior: Behavior normal.        Thought Content: Thought content normal.        Judgment: Judgment normal.    Depression screen [facility] 2/9 07/04/2019 06/05/2019 03/13/2019 12/13/2018 10/08/2018  Decreased Interest 0 0 0 0 0  Down, Depressed, Hopeless 0 0 0 0 0  PHQ - 2 Score 0 0 0 0 0  Altered sleeping 0 - - - -  Tired, decreased energy 0 - - - -  Change in appetite 0 - - - -  Feeling bad or failure about yourself  0 - - - -  Trouble concentrating 0 - - - -  Moving slowly or fidgety/restless 0 - - - -  Suicidal thoughts 0 - - - -  PHQ-9 Score 0 - - - -    Functional Status Survey: Is the patient deaf or have difficulty hearing?: No Does the patient have difficulty seeing, even when wearing glasses/contacts?: No Does the patient have difficulty concentrating, remembering, or making decisions?: No Does the patient have difficulty walking or climbing stairs?: No Does  the patient have difficulty dressing or bathing?: No Does the patient have difficulty doing errands alone such as visiting a doctor's office or shopping?: No  MMSE - [place] Exam 07/04/2019  Orientation to time 5  Orientation to Place 5  Registration 3  Attention/ Calculation 5  Recall 3  Language- name 2 objects 2  Language- repeat 1  Language- follow 3 step command 3  Language- read & follow direction 1  Write a sentence 1  Copy design 1  Total score 30    Fall Risk  07/04/2019 06/05/2019 03/13/2019 12/13/2018 10/08/2018  Falls in the past year? 0 0 0 0 0  Number falls in past yr: 0 - - - -  Injury with Fall? 0 - - - -      LABS: Recent Results (from the past 2160 hour(s))  POCT HgB A1C     Status: Abnormal   Collection Time: 07/04/19 10:56 AM  Result Value Ref Range   Hemoglobin A1C 7.0 (A) 4.0 - 5.6 %   HbA1c POC (<> result, manual entry)     HbA1c, POC (prediabetic range)     HbA1c, POC (controlled diabetic range)     Assessment/Plan: 1. Encounter for general adult medical examination with abnormal findings Annual health maintenance exam today  2. Uncontrolled type 2 diabetes mellitus with hyperglycemia (HCC) - POCT HgB A1C 7.0 today. Continue diabetic medications as prescribed   3. Essential hypertension Generally stable. Continue bp medication as prescribed. Monitor closely.  4. Chest pain, unspecified type Stypical. Continue regular visits with cardiology as scheduled.   5. Erectile dysfunction, unspecified erectile dysfunction type Written prescription for cialis 20mg  prn given to patient.   6. Flu vaccine need - Flu Vaccine MDCK QUAD PF  7. Dysuria - UA/M w/rflx Culture, Routine  8. Encounter for hepatitis C screening test for low risk  patient Will check Heptitis C with routine, fasting labs.   General Counseling: Gabriel Burton verbalizes understanding of the findings of todays visit and agrees with plan of treatment. I have discussed any further  diagnostic evaluation that may be needed or ordered today. We also reviewed his medications today. he has been encouraged to call the office with any questions or concerns that should arise related to todays visit.    Counseling:  Diabetes Counseling:  1. Addition of ACE inh/ ARB'S for nephroprotection. Microalbumin is updated  2. Diabetic foot care, prevention of complications. Podiatry consult 3. Exercise and lose weight.  4. Diabetic eye examination, Diabetic eye exam is updated  5. Monitor blood sugar closlely. nutrition counseling.  6. Sign and symptoms of hypoglycemia including shaking sweating,confusion and headaches.  This patient was seen by Vincent Gros FNP Collaboration with Dr Lyndon Code as a part of collaborative care agreement  Orders Placed This Encounter  Procedures  . Flu Vaccine MDCK QUAD PF  . UA/M w/rflx Culture, Routine  . POCT HgB A1C     Time spent: 74 Minutes      Lyndon Code, MD  Internal Medicine

## 2019-07-05 LAB — UA/M W/RFLX CULTURE, ROUTINE
Bilirubin, UA: NEGATIVE
Glucose, UA: NEGATIVE
Ketones, UA: NEGATIVE
Leukocytes,UA: NEGATIVE
Nitrite, UA: NEGATIVE
Protein,UA: NEGATIVE
RBC, UA: NEGATIVE
Specific Gravity, UA: 1.012 (ref 1.005–1.030)
Urobilinogen, Ur: 0.2 mg/dL (ref 0.2–1.0)
pH, UA: 7 (ref 5.0–7.5)

## 2019-07-05 LAB — MICROSCOPIC EXAMINATION
Bacteria, UA: NONE SEEN
Casts: NONE SEEN /lpf
RBC, Urine: NONE SEEN /hpf (ref 0–2)

## 2019-07-07 ENCOUNTER — Other Ambulatory Visit: Payer: Self-pay

## 2019-07-07 DIAGNOSIS — E1165 Type 2 diabetes mellitus with hyperglycemia: Secondary | ICD-10-CM

## 2019-07-07 MED ORDER — TOUJEO SOLOSTAR 300 UNIT/ML ~~LOC~~ SOPN: 35.0000 [IU] | PEN_INJECTOR | Freq: Every day | SUBCUTANEOUS | 3 refills | Status: DC

## 2019-07-10 ENCOUNTER — Encounter: Payer: Self-pay | Admitting: Nurse Practitioner

## 2019-07-14 ENCOUNTER — Other Ambulatory Visit: Payer: Self-pay

## 2019-07-14 DIAGNOSIS — E1165 Type 2 diabetes mellitus with hyperglycemia: Secondary | ICD-10-CM

## 2019-07-14 MED ORDER — TOUJEO SOLOSTAR 300 UNIT/ML ~~LOC~~ SOPN: 35.0000 [IU] | PEN_INJECTOR | Freq: Every day | SUBCUTANEOUS | 3 refills | Status: DC

## 2019-07-22 ENCOUNTER — Ambulatory Visit: Payer: BC Managed Care – PPO | Admitting: Nurse Practitioner

## 2019-07-22 ENCOUNTER — Encounter: Payer: Self-pay | Admitting: Nurse Practitioner

## 2019-07-22 ENCOUNTER — Other Ambulatory Visit: Payer: Self-pay

## 2019-07-22 VITALS — Ht 74.0 in

## 2019-07-22 DIAGNOSIS — I10 Essential (primary) hypertension: Secondary | ICD-10-CM | POA: Diagnosis not present

## 2019-07-22 DIAGNOSIS — J069 Acute upper respiratory infection, unspecified: Secondary | ICD-10-CM

## 2019-07-22 MED ORDER — SULFAMETHOXAZOLE-TRIMETHOPRIM 800-160 MG PO TABS: 1.0000 | ORAL_TABLET | Freq: Two times a day (BID) | ORAL | 0 refills | Status: DC

## 2019-07-22 NOTE — Progress Notes (Signed)
[facility] [address] [place], [place] [address]  Internal MEDICINE  Telephone Visit  Patient Name: Gabriel Burton  [id number]  [id number]  Date of Service: 07/22/2019  I connected with the patient at 12:01pm by telephone and verified the patients identity using two identifiers.   I discussed the limitations, risks, security and privacy concerns of performing an evaluation and management service by telephone and the availability of in person appointments. I also discussed with the patient that there may be a patient responsible charge related to the service.  The patient expressed understanding and agrees to proceed.    Chief Complaint  Patient presents with  . Telephone Assessment  . Sinusitis    head cold, hard to breather, blows out clear mucous, headache, dizziness, has been going on for 3-4 days, has gotten better but still there    The patient has been contacted via telephone for follow up visit due to concerns for spread of novel coronavirus. The patient presents for acute visit. Today, he is complaining of congestion, sinus headache, cough, and post-nasal drip. He states that he sweats at night. Symptoms have been present for about five days. He does not believe he has been exposed to anyone who has COVID 19. He denies fever. He has had sore throat and some voice changes. He has been taking DayQuil/DayQuil. He has had little improvement from symptoms with these medications.       Current Medication: Outpatient Encounter Medications as of 07/22/2019  Medication Sig  . amLODipine (NORVASC) 10 MG tablet Take 1 tablet (10 mg total) by mouth daily.  Marland Kitchen aspirin EC 81 MG tablet Take 81 mg by mouth daily.   Marland Kitchen atorvastatin (LIPITOR) 10 MG tablet Take 1 tablet (10 mg total) by mouth at bedtime.  . carvedilol (COREG) 6.25 MG tablet Take 6.25 mg by mouth 2 (two) times daily with a meal.  . Cholecalciferol (VITAMIN D3) 5000 units TABS Take 5,000 Units by mouth daily.   Marland Kitchen  CINNAMON PO Take by mouth daily.   . fluticasone (FLONASE) 50 MCG/ACT nasal spray Place 2 sprays into both nostrils daily.  Marland Kitchen gabapentin (NEURONTIN) 100 MG capsule Take 1 capsule (100 mg total) by mouth daily.  . Insulin Pen Needle (BD PEN NEEDLE NANO U/F) 32G X 4 MM MISC USE AS DIRECTED  TWICE  A DAILY DIAG E11.65  . liraglutide (VICTOZA) 18 MG/3ML SOPN Inject 0.3 mLs (1.8 mg total) into the skin daily.  Marland Kitchen lisinopril-hydrochlorothiazide (ZESTORETIC) 20-12.5 MG tablet Take 2 tab by po once a daily for hypertension  . pantoprazole (PROTONIX) 40 MG tablet Take 1 tablet (40 mg total) by mouth daily.  . tadalafil (CIALIS) 10 MG tablet Take 1 tablet (10 mg total) by mouth daily as needed for erectile dysfunction.  . tamsulosin (FLOMAX) 0.4 MG CAPS capsule TAKE 1 CAPSULE BY MOUTH ONE-HALF HOUR AFTER THE SAME MEAL EACH DAY  . TOUJEO SOLOSTAR 300 UNIT/ML SOPN Inject 35 Units into the skin daily.  . traMADol (ULTRAM) 50 MG tablet Take 1 tablet (50 mg total) by mouth every 6 (six) hours as needed.  . vitamin C (ASCORBIC ACID) 500 MG tablet Take 500 mg by mouth daily.   . nitroGLYCERIN (NITROSTAT) 0.4 MG SL tablet Place 1 tablet (0.4 mg total) under the tongue every 5 (five) minutes as needed for chest pain.  Marland Kitchen sulfamethoxazole-trimethoprim (BACTRIM DS) 800-160 MG tablet Take 1 tablet by mouth 2 (two) times daily.  . [DISCONTINUED] amoxicillin-clavulanate (AUGMENTIN) 875-125 MG tablet  Take 1 tablet by mouth 2 (two) times daily. (Patient not taking: Reported on 07/22/2019)   No facility-administered encounter medications on file as of 07/22/2019.    Surgical History: History reviewed. No pertinent surgical history.  Medical History: Past Medical History:  Diagnosis Date  . Diabetes mellitus without complication (HCC)   . Hypertension     Family History: Family History  Problem Relation Age of Onset  . Hypertension Mother   . Hypertension Father   . Heart attack Brother 50    Social History    Socioeconomic History  . Marital status: Widowed    Spouse name: Not on file  . Number of children: Not on file  . Years of education: Not on file  . Highest education level: Not on file  Occupational History  . Not on file  Tobacco Use  . Smoking status: Former Smoker    Packs/day: 0.40    Years: 8.00    Pack years: 3.20    Types: Cigarettes    Quit date: 2000    Years since quitting: 21.0  . Smokeless tobacco: Never Used  Substance and Sexual Activity  . Alcohol use: Never  . Drug use: Never  . Sexual activity: Not on file  Other Topics Concern  . Not on file  Social History Narrative  . Not on file   Social Determinants of Health   Financial Resource Strain:   . Difficulty of Paying Living Expenses: Not on file  Food Insecurity:   . Worried About Programme researcher, broadcasting/film/video in the Last Year: Not on file  . Ran Out of Food in the Last Year: Not on file  Transportation Needs:   . Lack of Transportation (Medical): Not on file  . Lack of Transportation (Non-Medical): Not on file  Physical Activity:   . Days of Exercise per Week: Not on file  . Minutes of Exercise per Session: Not on file  Stress:   . Feeling of Stress : Not on file  Social Connections:   . Frequency of Communication with Friends and Family: Not on file  . Frequency of Social Gatherings with Friends and Family: Not on file  . Attends Religious Services: Not on file  . Active Member of Clubs or Organizations: Not on file  . Attends Banker Meetings: Not on file  . Marital Status: Not on file  Intimate Partner Violence:   . Fear of Current or Ex-Partner: Not on file  . Emotionally Abused: Not on file  . Physically Abused: Not on file  . Sexually Abused: Not on file      Review of Systems  Constitutional: Positive for chills and fatigue. Negative for unexpected weight change.  HENT: Positive for congestion, postnasal drip, rhinorrhea, sinus pressure, sinus pain, sore throat and voice  change. Negative for sneezing.   Respiratory: Positive for cough. Negative for chest tightness, shortness of breath and wheezing.   Cardiovascular: Negative for chest pain and palpitations.  Gastrointestinal: Negative for abdominal pain, constipation, diarrhea, nausea and vomiting.  Musculoskeletal: Positive for myalgias. Negative for arthralgias, back pain, joint swelling and neck pain.  Skin: Negative for rash.  Allergic/Immunologic: Positive for environmental allergies.  Neurological: Positive for headaches. Negative for tremors and numbness.  Hematological: Negative for adenopathy. Does not bruise/bleed easily.  Psychiatric/Behavioral: Negative for behavioral problems (Depression), sleep disturbance and suicidal ideas. The patient is not nervous/anxious.     Vital Signs: Ht 6\' 2"  (1.88 m)   BMI 27.09 kg/m  Observation/Objective:  The patient is alert and oriented. He is pleasant and answering all questions appropriately. Breathing is non-labored. He is in no acute distress. The patient is nasally congested and sounds as though he does not feel well.    Assessment/Plan: 1. Acute upper respiratory infection Start bactrim DS bid for 10 days. Rest and increase fluids. Continue to take OTC DayQuil and NyQuil to treat acute symptoms. Advised him to notify the office if symptoms worsen or do not improve in next few days.  - sulfamethoxazole-trimethoprim (BACTRIM DS) 800-160 MG tablet; Take 1 tablet by mouth 2 (two) times daily.  Dispense: 20 tablet; Refill: 0  2. Essential hypertension Continue BP medication as prescribed   General Counseling: Kaiel verbalizes understanding of the findings of today's phone visit and agrees with plan of treatment. I have discussed any further diagnostic evaluation that may be needed or ordered today. We also reviewed his medications today. he has been encouraged to call the office with any questions or concerns that should arise related to todays  visit.   This patient was seen by [place] with Dr Lavera Guise as a part of collaborative care agreement  Meds ordered this encounter  Medications  . sulfamethoxazole-trimethoprim (BACTRIM DS) 800-160 MG tablet    Sig: Take 1 tablet by mouth 2 (two) times daily.    Dispense:  20 tablet    Refill:  0    Order Specific Question:   Supervising Provider    Answer:   Lavera Guise [[id number]]    Total Time spent: 61 Minutes    Dr Lavera Guise Internal medicine

## 2019-08-14 ENCOUNTER — Other Ambulatory Visit: Payer: Self-pay

## 2019-08-14 MED ORDER — AMLODIPINE BESYLATE 10 MG PO TABS: 10.0000 mg | ORAL_TABLET | Freq: Every day | ORAL | 5 refills | Status: DC

## 2019-09-09 ENCOUNTER — Telehealth: Payer: Self-pay | Admitting: Internal Medicine

## 2019-09-09 NOTE — Telephone Encounter (Signed)
Patient has been contacted at least 3 times for a recall, recall has been deleted  

## 2019-09-15 NOTE — Progress Notes (Signed)
Cardiology Office Note    Date:  09/16/2019   ID:  Soul, Hackman [birth year], MRN [id number]  PCP:  Ronnell Freshwater, NP  Cardiologist:  [name], MD  Electrophysiologist:  None   Chief Complaint: Follow up  History of Present Illness:   Gabriel Burton is a 69 y.o. male with history of chest pain, CKD stage II, DM2, HTN, HLD, and LBBB who presents for follow up of chest pain and hypertension.   Patient was initially evaluated by Dr. Saunders Revel in 10/2018 for chest pain with preceding ETT being nondiagnostic secondary to baseline LBBB.  Echo, performed by outside office, from 09/2018, showed normal LV size with mild LVH with normal LVEF, diastolic dysfunction, trace mitral and mild tricuspid regurgitation, normal RV size and systolic function.  At that time, he reported a 3-week history of left sided chest/shoulder/arm pain that would come and go and was noted to be improved with tramadol.  It seemed to be worsened by deep inspiration.  Discussion of Lexiscan Myoview was undertaken through the patient preferred to defer this in favor of medical therapy.  He was seen by Dr. Saunders Revel on 12/27/2018 noting the above symptoms resolved with use of naproxen.  However, he continued to note occasional vague chest discomfort from time to time with location varying and intermittent.  Symptoms were nonexertional and would last for 5 minutes or less.  He was started on Coreg.  He underwent Lexiscan Myoview on 01/02/2019 which showed no significant ischemia, fixed defect of the septal wall, apical region with hypokinesis of the septal wall with an EF estimated at 43% with perfusion defect and depressed EF possibly secondary to underlying LBBB.  Very mild coronary artery calcification and aortic atherosclerosis noted on CT imaging.  Overall, this was a low to moderate risk scan.  He was most recently seen in in the office in 01/2019 and was doing well from a cardiac perspective. With the addition of Coreg, he noted  almost complete resolution of his chest pain.  He continued to note intermittent flank/back pain that improved with naproxen/tramadol.   He comes in doing well today.  He feels about the same as he did when he was seen in 01/2019.  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  At times, he will take a sublingual nitroglycerin with some improvement in symptoms.  He continues a regular exercise regimen without anginal symptoms.  He is unable to exclude possible musculoskeletal component to his symptoms as it seems to only correlate with heavy cases.  Overall, he is quite pleased with how he has been feeling over the past 6 to 9 months.  BP at home will range from the 035W to 656C systolic.  He is tolerating all medications without issues.  No falls.  He prefers to defer any further testing at this time given stability of symptoms.   Labs independently reviewed: 06/2019 - A1c 7.0 05/2018 - TSH normal, LDL 62, HGB 14.3, SCr 1.29, K+ 3.8, albumin 4.7, AST/ALT normal  Past Medical History:  Diagnosis Date  . Diabetes mellitus without complication ([place])   . Hypertension     History reviewed. No pertinent surgical history.  Current Medications: Current Meds  Medication Sig  . amLODipine (NORVASC) 10 MG tablet Take 1 tablet (10 mg total) by mouth daily.  Marland Kitchen aspirin EC 81 MG tablet Take 81 mg by mouth daily.   Marland Kitchen atorvastatin (LIPITOR) 10  MG tablet Take 1 tablet (10 mg total) by mouth at bedtime.  . carvedilol (COREG) 6.25 MG tablet Take 6.25 mg by mouth 2 (two) times daily with a meal.  . Cholecalciferol (VITAMIN D3) 5000 units TABS Take 5,000 Units by mouth daily.   Marland Kitchen CINNAMON PO Take by mouth daily.   . fluticasone (FLONASE) 50 MCG/ACT nasal spray Place 2 sprays into both nostrils daily.  Marland Kitchen gabapentin (NEURONTIN) 100 MG capsule Take 1 capsule (100 mg total) by mouth daily.  . Insulin Pen Needle (BD PEN NEEDLE NANO U/F) 32G X 4 MM MISC USE AS  DIRECTED  TWICE  A DAILY DIAG E11.65  . liraglutide (VICTOZA) 18 MG/3ML SOPN Inject 0.3 mLs (1.8 mg total) into the skin daily.  Marland Kitchen lisinopril-hydrochlorothiazide (ZESTORETIC) 20-12.5 MG tablet Take 2 tab by po once a daily for hypertension  . pantoprazole (PROTONIX) 40 MG tablet Take 1 tablet (40 mg total) by mouth daily.  Marland Kitchen sulfamethoxazole-trimethoprim (BACTRIM DS) 800-160 MG tablet Take 1 tablet by mouth 2 (two) times daily.  . tadalafil (CIALIS) 10 MG tablet Take 1 tablet (10 mg total) by mouth daily as needed for erectile dysfunction.  . tamsulosin (FLOMAX) 0.4 MG CAPS capsule TAKE 1 CAPSULE BY MOUTH ONE-HALF HOUR AFTER THE SAME MEAL EACH DAY  . TOUJEO SOLOSTAR 300 UNIT/ML SOPN Inject 35 Units into the skin daily.  . traMADol (ULTRAM) 50 MG tablet Take 1 tablet (50 mg total) by mouth every 6 (six) hours as needed.  . vitamin C (ASCORBIC ACID) 500 MG tablet Take 500 mg by mouth daily.     Allergies:   Patient has no known allergies.   Social History   Socioeconomic History  . Marital status: Widowed    Spouse name: Not on file  . Number of children: Not on file  . Years of education: Not on file  . Highest education level: Not on file  Occupational History  . Not on file  Tobacco Use  . Smoking status: Former Smoker    Packs/day: 0.40    Years: 8.00    Pack years: 3.20    Types: Cigarettes    Quit date: 2000    Years since quitting: 21.1  . Smokeless tobacco: Never Used  Substance and Sexual Activity  . Alcohol use: Never  . Drug use: Never  . Sexual activity: Not on file  Other Topics Concern  . Not on file  Social History Narrative  . Not on file   Social Determinants of Health   Financial Resource Strain:   . Difficulty of Paying Living Expenses: Not on file  Food Insecurity:   . Worried About Programme researcher, broadcasting/film/video in the Last Year: Not on file  . Ran Out of Food in the Last Year: Not on file  Transportation Needs:   . Lack of Transportation (Medical): Not on  file  . Lack of Transportation (Non-Medical): Not on file  Physical Activity:   . Days of Exercise per Week: Not on file  . Minutes of Exercise per Session: Not on file  Stress:   . Feeling of Stress : Not on file  Social Connections:   . Frequency of Communication with Friends and Family: Not on file  . Frequency of Social Gatherings with Friends and Family: Not on file  . Attends Religious Services: Not on file  . Active Member of Clubs or Organizations: Not on file  . Attends Banker Meetings: Not on file  . Marital Status:  Not on file     Family History:  The patient's family history includes Heart attack (age of onset: 38) in his brother; Hypertension in his father and mother.  ROS:   Review of Systems  Constitutional: Negative for chills, diaphoresis, fever, malaise/fatigue and weight loss.  HENT: Negative for congestion.   Eyes: Negative for discharge and redness.  Respiratory: Negative for cough, sputum production, shortness of breath and wheezing.   Cardiovascular: Positive for chest pain. Negative for palpitations, orthopnea, claudication, leg swelling and PND.  Gastrointestinal: Negative for abdominal pain, heartburn, nausea and vomiting.  Musculoskeletal: Negative for falls and myalgias.  Skin: Negative for rash.  Neurological: Negative for dizziness, tingling, tremors, sensory change, speech change, focal weakness, loss of consciousness and weakness.  Psychiatric/Behavioral: Negative for substance abuse. The patient is not nervous/anxious.   All other systems reviewed and are negative.    EKGs/Labs/Other Studies Reviewed:    Studies reviewed were summarized above. The additional studies were reviewed today:  Lexiscan MPI 12/2018: Pharmacological myocardial perfusion imaging study with no significant ischemia Fixed defect septal wall, apical region, Hypokinesis of the septal wall, EF estimated at 43% Perfusion defect and depressed ejection fraction  possibly secondary to left bundle branch block No EKG changes concerning for ischemia at peak stress or in recovery. Very mild coronary calcification and aortic atherosclerosis on CT imaging Low to moderate risk scan __________  2D echo 09/2018: Normal LV size with mild LVH with normal LVEF, diastolic dysfunction, trace mitral and mild tricuspid regurgitation, normal RV size and systolic function. __________  ETT 09/2018: -Nondiagnostic secondary to underlying LBBB  EKG:  EKG is ordered today.  The EKG ordered today demonstrates NSR, first-degree AV block, 71 bpm, LBBB (known)  Recent Labs: No results found for requested labs within last 8760 hours.  Recent Lipid Panel    Component Value Date/Time   CHOL 147 06/03/2018 1541   TRIG 89 06/03/2018 1541   HDL 67 06/03/2018 1541   LDLCALC 62 06/03/2018 1541    PHYSICAL EXAM:    VS:  BP 140/90 (BP Location: Left Arm, Patient Position: Sitting, Cuff Size: Normal)   Pulse 71   Ht 6\' 2"  (1.88 m)   Wt 217 lb (98.4 kg)   SpO2 98%   BMI 27.86 kg/m   BMI: Body mass index is 27.86 kg/m.  Physical Exam  Constitutional: He is oriented to person, place, and time. He appears well-developed and well-nourished.  HENT:  Head: Normocephalic and atraumatic.  Eyes: Right eye exhibits no discharge. Left eye exhibits no discharge.  Neck: No JVD present.  Cardiovascular: Normal rate, regular rhythm, S1 normal, S2 normal and normal heart sounds. Exam reveals no distant heart sounds, no friction rub, no midsystolic click and no opening snap.  No murmur heard. Pulses:      Posterior tibial pulses are 2+ on the right side and 2+ on the left side.  Pulmonary/Chest: Effort normal and breath sounds normal. No respiratory distress. He has no decreased breath sounds. He has no wheezes. He has no rales. He exhibits no tenderness.  Abdominal: Soft. He exhibits no distension. There is no abdominal tenderness.  Musculoskeletal:        General: No edema.      Cervical back: Normal range of motion.  Neurological: He is alert and oriented to person, place, and time.  Skin: Skin is warm and dry. No cyanosis. Nails show no clubbing.  Psychiatric: He has a normal mood and affect. His speech is normal  and behavior is normal. Judgment and thought content normal.    Wt Readings from Last 3 Encounters:  09/16/19 217 lb (98.4 kg)  07/04/19 211 lb (95.7 kg)  03/13/19 214 lb (97.1 kg)     ASSESSMENT & PLAN:   1. Coronary artery calcification/aortic atherosclerosis: He is doing well with stable symptoms and feels about the same as he did when he was seen in 01/2019.  Recent Lexiscan MPI did not show any evidence of significant ischemia with very mild coronary artery calcification and aortic atherosclerosis noted on CT imaging.  His LVEF was estimated at 43% which was felt to be decreased secondary to underlying left bundle branch block.  Echo from 09/2018 demonstrated preserved LV systolic function.  Again, discussed potential coronary artery CTA though given the very mild coronary artery calcification noted on CT images from his Myoview the likelihood of him having obstructive coronary artery disease is low.  He prefers to defer any further testing at this time with escalation of medical therapy.  He is also able to exercise without anginal symptoms.  Cannot exclude a musculoskeletal component to his discomfort given it seems to occur when lifting cases at work.  Titrate Coreg to 12.5 mg twice daily.  He will otherwise continue current medical regimen including amlodipine, Lipitor, and as needed nitroglycerin.  Could consider addition of isosorbide mononitrate in follow-up.  2. HTN: Blood pressure is mildly elevated today at 140/90.  Titrate carvedilol to 12.5 mg twice daily.  He will otherwise continue current dose of lisinopril/HCTZ and amlodipine.  3. HLD: LDL of 62 from 05/2018 with normal liver function at that time.  Goal LDL less than 70.  He remains on  atorvastatin 10 mg daily.  We will plan for fasting labs at his next visit.  This was discussed with the patient in the office today.   Disposition: F/u with Dr. Okey Dupre or an APP in 3 months.   Medication Adjustments/Labs and Tests Ordered: Current medicines are reviewed at length with the patient today.  Concerns regarding medicines are outlined above. Medication changes, Labs and Tests ordered today are summarized above and listed in the Patient Instructions accessible in Encounters.   Signed, Eula Listen, PA-C 09/16/2019 1:59 PM     [facility] [address] [place], [place] [address] [phone]

## 2019-09-16 ENCOUNTER — Ambulatory Visit (INDEPENDENT_AMBULATORY_CARE_PROVIDER_SITE_OTHER): Payer: BC Managed Care – PPO | Admitting: Physician Assistant

## 2019-09-16 ENCOUNTER — Encounter: Payer: Self-pay | Admitting: Physician Assistant

## 2019-09-16 ENCOUNTER — Other Ambulatory Visit: Payer: Self-pay

## 2019-09-16 VITALS — BP 140/90 | HR 71 | Ht 74.0 in | Wt 217.0 lb

## 2019-09-16 DIAGNOSIS — I2510 Atherosclerotic heart disease of native coronary artery without angina pectoris: Secondary | ICD-10-CM

## 2019-09-16 DIAGNOSIS — E785 Hyperlipidemia, unspecified: Secondary | ICD-10-CM | POA: Diagnosis not present

## 2019-09-16 DIAGNOSIS — I10 Essential (primary) hypertension: Secondary | ICD-10-CM

## 2019-09-16 DIAGNOSIS — I2584 Coronary atherosclerosis due to calcified coronary lesion: Secondary | ICD-10-CM | POA: Diagnosis not present

## 2019-09-16 DIAGNOSIS — I700 Atherosclerosis of aorta: Secondary | ICD-10-CM | POA: Diagnosis not present

## 2019-09-16 MED ORDER — CARVEDILOL 12.5 MG PO TABS: 12.5000 mg | ORAL_TABLET | Freq: Two times a day (BID) | ORAL | 3 refills | Status: DC

## 2019-09-16 NOTE — Patient Instructions (Signed)
Medication Instructions:  Your physician has recommended you make the following change in your medication:   1). Take Coreg 12.5 mg twice daily.   *If you need a refill on your cardiac medications before your next appointment, please call your pharmacy*   Lab Work: None If you have labs (blood work) drawn today and your tests are completely normal, you will receive your results only by: Marland Kitchen MyChart Message (if you have MyChart) OR . A paper copy in the mail If you have any lab test that is abnormal or we need to change your treatment, we will call you to review the results.   Testing/Procedures: None   Follow-Up: At [facility], you and your health needs are our priority.  As part of our continuing mission to provide you with exceptional heart care, we have created designated Provider Care Teams.  These Care Teams include your primary Cardiologist (physician) and Advanced Practice Providers (APPs -  Physician Assistants and Nurse Practitioners) who all work together to provide you with the care you need, when you need it.  We recommend signing up for the patient portal called "MyChart".  Sign up information is provided on this After Visit Summary.  MyChart is used to connect with patients for Virtual Visits (Telemedicine).  Patients are able to view lab/test results, encounter notes, upcoming appointments, etc.  Non-urgent messages can be sent to your provider as well.   To learn more about what you can do with MyChart, go to [email or web address].    Your next appointment:   3 month(s)  The format for your next appointment:   In Person  Provider:    You may see Yvonne Kendall, MD or Eula Listen, PA-C     Other Instructions

## 2019-09-17 ENCOUNTER — Other Ambulatory Visit: Payer: Self-pay

## 2019-09-17 DIAGNOSIS — Z794 Long term (current) use of insulin: Secondary | ICD-10-CM

## 2019-09-17 DIAGNOSIS — E1140 Type 2 diabetes mellitus with diabetic neuropathy, unspecified: Secondary | ICD-10-CM

## 2019-09-17 MED ORDER — GABAPENTIN 100 MG PO CAPS: 100.0000 mg | ORAL_CAPSULE | Freq: Every day | ORAL | 1 refills | Status: DC

## 2019-09-30 ENCOUNTER — Telehealth: Payer: Self-pay

## 2019-09-30 NOTE — Telephone Encounter (Signed)
CONFIRMED AND SCREENED FOR 10-02-19 OV. 

## 2019-10-01 ENCOUNTER — Telehealth: Payer: Self-pay

## 2019-10-01 NOTE — Telephone Encounter (Signed)
Patient rescheduled appointment on 10/02/2019 to 10/28/2019.

## 2019-10-02 ENCOUNTER — Ambulatory Visit: Payer: BC Managed Care – PPO | Admitting: Nurse Practitioner

## 2019-10-13 ENCOUNTER — Other Ambulatory Visit: Payer: Self-pay

## 2019-10-13 MED ORDER — VICTOZA 18 MG/3ML ~~LOC~~ SOPN: 1.8000 mg | PEN_INJECTOR | Freq: Every day | SUBCUTANEOUS | 5 refills | Status: DC

## 2019-10-21 ENCOUNTER — Other Ambulatory Visit: Payer: Self-pay

## 2019-10-21 MED ORDER — PANTOPRAZOLE SODIUM 40 MG PO TBEC: 40.0000 mg | DELAYED_RELEASE_TABLET | Freq: Every day | ORAL | 5 refills | Status: DC

## 2019-10-24 ENCOUNTER — Other Ambulatory Visit: Payer: Self-pay | Admitting: Nurse Practitioner

## 2019-10-24 ENCOUNTER — Telehealth: Payer: Self-pay

## 2019-10-24 NOTE — Telephone Encounter (Signed)
LMOM TO CONFIRM AND SCREEN FOR 10-28-19 OV. 

## 2019-10-24 NOTE — Telephone Encounter (Signed)
Confirmed appointment on 10/28/2019 and screened for covid. kl[facility] °

## 2019-10-25 LAB — CBC
Hematocrit: 43 % (ref 37.5–51.0)
Hemoglobin: 14.2 g/dL (ref 13.0–17.7)
MCH: 29 pg (ref 26.6–33.0)
MCHC: 33 g/dL (ref 31.5–35.7)
MCV: 88 fL (ref 79–97)
Platelets: 206 10*3/uL (ref 150–450)
RBC: 4.9 x10E6/uL (ref 4.14–5.80)
RDW: 12.7 % (ref 11.6–15.4)
WBC: 4.7 10*3/uL (ref 3.4–10.8)

## 2019-10-25 LAB — LIPID PANEL W/O CHOL/HDL RATIO
Cholesterol, Total: 148 mg/dL (ref 100–199)
HDL: 66 mg/dL (ref >39)
LDL Chol Calc (NIH): 67 mg/dL (ref 0–99)
Triglycerides: 76 mg/dL (ref 0–149)
VLDL Cholesterol Cal: 15 mg/dL (ref 5–40)

## 2019-10-25 LAB — COMPREHENSIVE METABOLIC PANEL
ALT: 38 IU/L (ref 0–44)
AST: 35 IU/L (ref 0–40)
Albumin/Globulin Ratio: 1.7 (ref 1.2–2.2)
Albumin: 4.5 g/dL (ref 3.8–4.8)
Alkaline Phosphatase: 130 IU/L — ABNORMAL HIGH (ref 39–117)
BUN/Creatinine Ratio: 9 — ABNORMAL LOW (ref 10–24)
BUN: 13 mg/dL (ref 8–27)
Bilirubin Total: 0.3 mg/dL (ref 0.0–1.2)
CO2: 26 mmol/L (ref 20–29)
Calcium: 9.8 mg/dL (ref 8.6–10.2)
Chloride: 101 mmol/L (ref 96–106)
Creatinine, Ser: 1.37 mg/dL — ABNORMAL HIGH (ref 0.76–1.27)
GFR calc Af Amer: 61 mL/min/{1.73_m2} (ref >59)
GFR calc non Af Amer: 53 mL/min/{1.73_m2} — ABNORMAL LOW (ref >59)
Globulin, Total: 2.7 g/dL (ref 1.5–4.5)
Glucose: 89 mg/dL (ref 65–99)
Potassium: 3.8 mmol/L (ref 3.5–5.2)
Sodium: 142 mmol/L (ref 134–144)
Total Protein: 7.2 g/dL (ref 6.0–8.5)

## 2019-10-25 LAB — HCV INTERPRETATION

## 2019-10-25 LAB — TSH: TSH: 1.01 u[IU]/mL (ref 0.450–4.500)

## 2019-10-25 LAB — T4, FREE: Free T4: 1.12 ng/dL (ref 0.82–1.77)

## 2019-10-25 LAB — HCV AB W REFLEX TO QUANT PCR: HCV Ab: <0.1 s/co ratio (ref 0.0–0.9)

## 2019-10-25 LAB — PSA: Prostate Specific Ag, Serum: 1.4 ng/mL (ref 0.0–4.0)

## 2019-10-28 ENCOUNTER — Other Ambulatory Visit: Payer: Self-pay

## 2019-10-28 ENCOUNTER — Ambulatory Visit: Payer: BC Managed Care – PPO | Admitting: Nurse Practitioner

## 2019-10-28 ENCOUNTER — Encounter: Payer: Self-pay | Admitting: Nurse Practitioner

## 2019-10-28 VITALS — BP 145/82 | HR 68 | Temp 97.4°F | Resp 16 | Ht 74.0 in | Wt 216.8 lb

## 2019-10-28 DIAGNOSIS — I499 Cardiac arrhythmia, unspecified: Secondary | ICD-10-CM

## 2019-10-28 DIAGNOSIS — E1165 Type 2 diabetes mellitus with hyperglycemia: Secondary | ICD-10-CM | POA: Diagnosis not present

## 2019-10-28 DIAGNOSIS — K219 Gastro-esophageal reflux disease without esophagitis: Secondary | ICD-10-CM | POA: Diagnosis not present

## 2019-10-28 DIAGNOSIS — Z794 Long term (current) use of insulin: Secondary | ICD-10-CM | POA: Diagnosis not present

## 2019-10-28 DIAGNOSIS — I10 Essential (primary) hypertension: Secondary | ICD-10-CM

## 2019-10-28 LAB — POCT GLYCOSYLATED HEMOGLOBIN (HGB A1C): Hemoglobin A1C: 7.7 % — AB (ref 4.0–5.6)

## 2019-10-28 MED ORDER — PANTOPRAZOLE SODIUM 40 MG PO TBEC: 40.0000 mg | DELAYED_RELEASE_TABLET | Freq: Every day | ORAL | 3 refills | Status: DC

## 2019-10-28 NOTE — Progress Notes (Signed)
[facility] [address] [place], [place] [address]  Internal MEDICINE  Office Visit Note  Patient Name: Gabriel Burton  [id number]  [id number]  Date of Service: 10/28/2019  Chief Complaint  Patient presents with  . Diabetes  . Hypertension    The patient is here for routine follow up visit. Blood sugars are a little elevated. States that his mother passed away about a month ago, complications related to COVID 19. He states that he had a lot of comfort foods, high in carbs and sugar over the past several weeks. He also states that cardiology changed his blood pressure medication. Added Coreg 12.5mg  twice daily. States this is making him feel tired and fatigued. He states that he has not been exercising quite as much as usual.       Current Medication: Outpatient Encounter Medications as of 10/28/2019  Medication Sig  . amLODipine (NORVASC) 10 MG tablet Take 1 tablet (10 mg total) by mouth daily.  Marland Kitchen aspirin EC 81 MG tablet Take 81 mg by mouth daily.   Marland Kitchen atorvastatin (LIPITOR) 10 MG tablet Take 1 tablet (10 mg total) by mouth at bedtime.  . carvedilol (COREG) 12.5 MG tablet Take 1 tablet (12.5 mg total) by mouth 2 (two) times daily with a meal.  . Cholecalciferol (VITAMIN D3) 5000 units TABS Take 5,000 Units by mouth daily.   Marland Kitchen CINNAMON PO Take by mouth daily.   . fluticasone (FLONASE) 50 MCG/ACT nasal spray Place 2 sprays into both nostrils daily.  Marland Kitchen gabapentin (NEURONTIN) 100 MG capsule Take 1 capsule (100 mg total) by mouth daily.  . Insulin Pen Needle (BD PEN NEEDLE NANO U/F) 32G X 4 MM MISC USE AS DIRECTED  TWICE  A DAILY DIAG E11.65  . liraglutide (VICTOZA) 18 MG/3ML SOPN Inject 0.3 mLs (1.8 mg total) into the skin daily.  Marland Kitchen lisinopril-hydrochlorothiazide (ZESTORETIC) 20-12.5 MG tablet Take 2 tab by po once a daily for hypertension  . pantoprazole (PROTONIX) 40 MG tablet Take 1 tablet (40 mg total) by mouth daily.  . tadalafil (CIALIS) 10 MG tablet Take 1 tablet  (10 mg total) by mouth daily as needed for erectile dysfunction.  . tamsulosin (FLOMAX) 0.4 MG CAPS capsule TAKE 1 CAPSULE BY MOUTH ONE-HALF HOUR AFTER THE SAME MEAL EACH DAY  . TOUJEO SOLOSTAR 300 UNIT/ML SOPN Inject 35 Units into the skin daily.  . traMADol (ULTRAM) 50 MG tablet Take 1 tablet (50 mg total) by mouth every 6 (six) hours as needed.  . vitamin C (ASCORBIC ACID) 500 MG tablet Take 500 mg by mouth daily.   . [DISCONTINUED] pantoprazole (PROTONIX) 40 MG tablet Take 1 tablet (40 mg total) by mouth daily.  . nitroGLYCERIN (NITROSTAT) 0.4 MG SL tablet Place 1 tablet (0.4 mg total) under the tongue every 5 (five) minutes as needed for chest pain.   No facility-administered encounter medications on file as of 10/28/2019.    Surgical History: History reviewed. No pertinent surgical history.  Medical History: Past Medical History:  Diagnosis Date  . Diabetes mellitus without complication (HCC)   . Hypertension     Family History: Family History  Problem Relation Age of Onset  . Hypertension Mother   . Hypertension Father   . Heart attack Brother 66    Social History   Socioeconomic History  . Marital status: Widowed    Spouse name: Not on file  . Number of children: Not on file  . Years of education: Not on file  .  Highest education level: Not on file  Occupational History  . Not on file  Tobacco Use  . Smoking status: Former Smoker    Packs/day: 0.40    Years: 8.00    Pack years: 3.20    Types: Cigarettes    Quit date: 2000    Years since quitting: 21.2  . Smokeless tobacco: Never Used  Substance and Sexual Activity  . Alcohol use: Never  . Drug use: Never  . Sexual activity: Not on file  Other Topics Concern  . Not on file  Social History Narrative  . Not on file   Social Determinants of Health   Financial Resource Strain:   . Difficulty of Paying Living Expenses:   Food Insecurity:   . Worried About Programme researcher, broadcasting/film/video in the Last Year:   . Occupational psychologist in the Last Year:   Transportation Needs:   . Freight forwarder (Medical):   Marland Kitchen Lack of Transportation (Non-Medical):   Physical Activity:   . Days of Exercise per Week:   . Minutes of Exercise per Session:   Stress:   . Feeling of Stress :   Social Connections:   . Frequency of Communication with Friends and Family:   . Frequency of Social Gatherings with Friends and Family:   . Attends Religious Services:   . Active Member of Clubs or Organizations:   . Attends Banker Meetings:   Marland Kitchen Marital Status:   Intimate Partner Violence:   . Fear of Current or Ex-Partner:   . Emotionally Abused:   Marland Kitchen Physically Abused:   . Sexually Abused:       Review of Systems  Constitutional: Positive for fatigue. Negative for activity change, chills and unexpected weight change.  HENT: Negative for congestion, postnasal drip, rhinorrhea, sneezing and sore throat.   Respiratory: Negative for cough, chest tightness and shortness of breath.   Cardiovascular: Negative for chest pain and palpitations.  Gastrointestinal: Negative for abdominal pain, constipation, diarrhea, nausea and vomiting.  Endocrine: Negative for cold intolerance, heat intolerance, polydipsia and polyuria.       Blood sugars have been higher than normal these past few months.   Genitourinary: Positive for frequency.  Musculoskeletal: Negative for arthralgias, back pain, joint swelling and neck pain.  Skin: Negative for rash.  Allergic/Immunologic: Positive for environmental allergies.  Neurological: Negative for dizziness, tremors, numbness and headaches.  Hematological: Negative for adenopathy. Does not bruise/bleed easily.  Psychiatric/Behavioral: Negative for behavioral problems (Depression), sleep disturbance and suicidal ideas. The patient is not nervous/anxious.     Today's Vitals   10/28/19 0851  BP: (!) 145/82  Pulse: 68  Resp: 16  Temp: (!) 97.4 F (36.3 C)  SpO2: 98%  Weight: 216 lb  12.8 oz (98.3 kg)  Height: 6\' 2"  (1.88 m)   Body mass index is 27.84 kg/m.  Physical Exam Vitals and nursing note reviewed.  Constitutional:      General: He is not in acute distress.    Appearance: Normal appearance. He is well-developed. He is not diaphoretic.  HENT:     Head: Normocephalic and atraumatic.     Nose: Nose normal.     Mouth/Throat:     Pharynx: No oropharyngeal exudate.  Eyes:     Pupils: Pupils are equal, round, and reactive to light.  Neck:     Thyroid: No thyromegaly.     Vascular: No carotid bruit or JVD.     Trachea: No tracheal deviation.  Cardiovascular:     Rate and Rhythm: Normal rate and regular rhythm.     Heart sounds: Murmur present. No friction rub. No gallop.      Comments: There is soft, blowing, systolic murmur present with auscultation.  Pulmonary:     Effort: Pulmonary effort is normal. No respiratory distress.     Breath sounds: Normal breath sounds. No wheezing or rales.  Chest:     Chest wall: No tenderness.  Abdominal:     Palpations: Abdomen is soft.  Musculoskeletal:        General: Normal range of motion.     Cervical back: Normal range of motion and neck supple.  Lymphadenopathy:     Cervical: No cervical adenopathy.  Skin:    General: Skin is warm and dry.  Neurological:     Mental Status: He is alert and oriented to person, place, and time.     Cranial Nerves: No cranial nerve deficit.  Psychiatric:        Mood and Affect: Mood normal.        Behavior: Behavior normal.        Thought Content: Thought content normal.        Judgment: Judgment normal.    Assessment/Plan: 1. Type 2 diabetes mellitus with hyperglycemia, with long-term current use of insulin (HCC) - POCT HgB A1C 7.7 today. Believe elevated due to recent death of his mother. Continue medications as prescribed. Return to diet with lower carbohydrates and sugar and higher in protein. Recheck HgbA1c at next visit.   2. Gastroesophageal reflux disease without  esophagitis conitnue pantoprazole as prescribed  - pantoprazole (PROTONIX) 40 MG tablet; Take 1 tablet (40 mg total) by mouth daily.  Dispense: 90 tablet; Refill: 3  3. Essential hypertension Stable. Recently started on new BP medication per cardiology. Advised him to contact provider is negative side effects were persistent.   4. Irregular heart rhythm Improved. Continue regular visits with cardiology as scheduled.   General Counseling: Hager verbalizes understanding of the findings of todays visit and agrees with plan of treatment. I have discussed any further diagnostic evaluation that may be needed or ordered today. We also reviewed his medications today. he has been encouraged to call the office with any questions or concerns that should arise related to todays visit.  Diabetes Counseling:  1. Addition of ACE inh/ ARB'S for nephroprotection. Microalbumin is updated  2. Diabetic foot care, prevention of complications. Podiatry consult 3. Exercise and lose weight.  4. Diabetic eye examination, Diabetic eye exam is updated  5. Monitor blood sugar closlely. nutrition counseling.  6. Sign and symptoms of hypoglycemia including shaking sweating,confusion and headaches.  This patient was seen by Leretha Pol FNP Collaboration with Dr Lavera Guise as a part of collaborative care agreement  Orders Placed This Encounter  Procedures  . POCT HgB A1C    Meds ordered this encounter  Medications  . pantoprazole (PROTONIX) 40 MG tablet    Sig: Take 1 tablet (40 mg total) by mouth daily.    Dispense:  90 tablet    Refill:  3    Please fill as 90 day prescription with next fill. Thanks.    Order Specific Question:   Supervising Provider    Answer:   Lavera Guise [[id number]]    Total time spent: 30 Minutes   Time spent includes review of chart, medications, test results, and follow up plan with the patient.      Dr Lavera Guise  Internal medicine

## 2019-10-28 NOTE — Progress Notes (Signed)
Discussed with patient during visit.

## 2019-12-11 ENCOUNTER — Other Ambulatory Visit: Payer: Self-pay

## 2019-12-11 MED ORDER — BD PEN NEEDLE NANO U/F 32G X 4 MM MISC: 3 refills | Status: DC

## 2019-12-16 ENCOUNTER — Telehealth: Payer: Self-pay

## 2019-12-16 DIAGNOSIS — I10 Essential (primary) hypertension: Secondary | ICD-10-CM

## 2019-12-16 MED ORDER — LISINOPRIL-HYDROCHLOROTHIAZIDE 20-12.5 MG PO TABS: ORAL_TABLET | ORAL | 1 refills | Status: DC

## 2019-12-16 NOTE — Telephone Encounter (Signed)
rx refill

## 2019-12-17 ENCOUNTER — Ambulatory Visit: Payer: BC Managed Care – PPO | Admitting: Internal Medicine

## 2019-12-18 ENCOUNTER — Encounter: Payer: Self-pay | Admitting: Internal Medicine

## 2019-12-18 ENCOUNTER — Other Ambulatory Visit: Payer: Self-pay

## 2019-12-18 ENCOUNTER — Ambulatory Visit (INDEPENDENT_AMBULATORY_CARE_PROVIDER_SITE_OTHER): Payer: BC Managed Care – PPO | Admitting: Internal Medicine

## 2019-12-18 VITALS — BP 140/80 | HR 69 | Ht 74.0 in | Wt 213.4 lb

## 2019-12-18 DIAGNOSIS — I10 Essential (primary) hypertension: Secondary | ICD-10-CM

## 2019-12-18 DIAGNOSIS — I447 Left bundle-branch block, unspecified: Secondary | ICD-10-CM

## 2019-12-18 DIAGNOSIS — R0789 Other chest pain: Secondary | ICD-10-CM

## 2019-12-18 DIAGNOSIS — I440 Atrioventricular block, first degree: Secondary | ICD-10-CM | POA: Diagnosis not present

## 2019-12-18 NOTE — Progress Notes (Signed)
Follow-up Outpatient Visit Date: 12/18/2019  Primary Care Provider: Ronnell Freshwater, NP [address] [place] [address]  Chief Complaint: Follow-up chest pain and hypertension  HPI:  Gabriel Burton is a 69 y.o. male with history of hypertension, hypertipidemia, LBBB and diabetes mellitus, who presents for follow-up of chest pain and hypertension.  He was last seen in our office by Christell Faith, PA, in early March.  At that time he was doing well but continued to note occasional chest discomfort when lifting heavy objects at work.  Home blood pressure was still elevated, similar to the 140/90 noted int he office.  Carvedilol was increased to 12.5 mg BID.  Today, Gabriel Burton reports that he is feeling relatively well.  He notes a few sporadic episodes of transient chest pain every few weeks for which she will occasionally use as needed nitroglycerin.  Has discomfort is not exertional sometimes seems to be related to when he feels stressed.  He denies shortness of breath, palpitations, lightheadedness, and edema.  He has not been checking his blood pressure regularly.  He admits to adding salt to some of his foods from time to time.  He is compliant with his medications.  --------------------------------------------------------------------------------------------------  Past Medical History:  Diagnosis Date  . Diabetes mellitus without complication ([place])   . Hypertension    History reviewed. No pertinent surgical history.  Current Meds  Medication Sig  . amLODipine (NORVASC) 10 MG tablet Take 1 tablet (10 mg total) by mouth daily.  Marland Kitchen aspirin EC 81 MG tablet Take 81 mg by mouth daily.   Marland Kitchen atorvastatin (LIPITOR) 10 MG tablet Take 1 tablet (10 mg total) by mouth at bedtime.  . carvedilol (COREG) 12.5 MG tablet Take 1 tablet (12.5 mg total) by mouth 2 (two) times daily with a meal.  . Cholecalciferol (VITAMIN D3) 5000 units TABS Take 5,000 Units by mouth daily.   Marland Kitchen CINNAMON PO Take by mouth  daily.   . fluticasone (FLONASE) 50 MCG/ACT nasal spray Place 2 sprays into both nostrils daily.  Marland Kitchen gabapentin (NEURONTIN) 100 MG capsule Take 1 capsule (100 mg total) by mouth daily.  . Insulin Pen Needle (BD PEN NEEDLE NANO U/F) 32G X 4 MM MISC USE AS DIRECTED  TWICE  A DAILY DIAG E11.65  . liraglutide (VICTOZA) 18 MG/3ML SOPN Inject 0.3 mLs (1.8 mg total) into the skin daily.  Marland Kitchen lisinopril-hydrochlorothiazide (ZESTORETIC) 20-12.5 MG tablet Take 2 tab po once daily for hypertension  . pantoprazole (PROTONIX) 40 MG tablet Take 1 tablet (40 mg total) by mouth daily.  . tadalafil (CIALIS) 10 MG tablet Take 1 tablet (10 mg total) by mouth daily as needed for erectile dysfunction.  . tamsulosin (FLOMAX) 0.4 MG CAPS capsule TAKE 1 CAPSULE BY MOUTH ONE-HALF HOUR AFTER THE SAME MEAL EACH DAY  . TOUJEO SOLOSTAR 300 UNIT/ML SOPN Inject 35 Units into the skin daily.  . traMADol (ULTRAM) 50 MG tablet Take 1 tablet (50 mg total) by mouth every 6 (six) hours as needed.  . vitamin C (ASCORBIC ACID) 500 MG tablet Take 500 mg by mouth daily.     Allergies: Patient has no known allergies.  Social History   Tobacco Use  . Smoking status: Former Smoker    Packs/day: 0.40    Years: 8.00    Pack years: 3.20    Types: Cigarettes    Quit date: 2000    Years since quitting: 21.4  . Smokeless tobacco: Never Used  Substance Use Topics  .  Alcohol use: Never  . Drug use: Never    Family History  Problem Relation Age of Onset  . Hypertension Mother   . Hypertension Father   . Heart attack Brother 42    Review of Systems: A 12-system review of systems was performed and was negative except as noted in the HPI.  --------------------------------------------------------------------------------------------------  Physical Exam: BP 140/80 (BP Location: Left Arm, Patient Position: Sitting, Cuff Size: Normal)   Pulse 69   Ht 6\' 2"  (1.88 m)   Wt 213 lb 6 oz (96.8 kg)   SpO2 95%   BMI 27.40 kg/m    General: NAD. HEENT: No conjunctival pallor or scleral icterus. Facemask in place. Neck: No JVD or HJR. Lungs: Normal work of breathing. Clear to auscultation bilaterally without wheezes or crackles. Heart: Regular rate and rhythm without murmurs, rubs, or gallops. Non-displaced PMI. Abd: Bowel sounds present. Soft, NT/ND without hepatosplenomegaly Ext: No lower extremity edema.  EKG: Normal sinus rhythm with left bundle branch block, left axis deviation, and first-degree AV block (PR interval 240 ms).  Compared with prior tracing from 09/16/2019, PR interval has increased slightly.  Lab Results  Component Value Date   WBC 4.7 10/24/2019   HGB 14.2 10/24/2019   HCT 43.0 10/24/2019   MCV 88 10/24/2019   PLT 206 10/24/2019    Lab Results  Component Value Date   NA 142 10/24/2019   K 3.8 10/24/2019   CL 101 10/24/2019   CO2 26 10/24/2019   BUN 13 10/24/2019   CREATININE 1.37 (H) 10/24/2019   GLUCOSE 89 10/24/2019   ALT 38 10/24/2019    Lab Results  Component Value Date   CHOL 148 10/24/2019   HDL 66 10/24/2019   LDLCALC 67 10/24/2019   TRIG 76 10/24/2019    --------------------------------------------------------------------------------------------------  ASSESSMENT AND PLAN: Atypical chest pain: Symptoms are relatively well controlled, though Gabriel Burton still takes intermittent sublingual nitroglycerin with some relief.  Prior ischemia testing, including pharmacologic myocardial perfusion stress test last year, was low risk with fixed septal defect most consistent with artifact from LBBB.  Given stable symptoms, we will defer further testing at this time.  We will continue current antianginal regimen consisting of amlodipine and carvedilol.  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  Given the lack of symptoms, I would like to continue  him on current dose of carvedilol for blood pressure control and antianginal therapy.  However, if his PR interval continues to lengthen, we will need to consider titrating off beta-blockers.  Hypertension: Blood pressure suboptimally controlled.  We discussed addition of another agent to achieve his target blood pressure of less than 130/80.  I also suggested that we consider work-up for secondary hypertension, including renal artery Doppler and serum aldosterone/plasma renin activity.  However, Gabriel Burton would like to defer additional testing and medication changes today, instead focusing on low-sodium diet.  I have provided him with information about the DASH diet.  Follow-up: Return to clinic in 3 months.  Montez Morita, MD 12/18/2019 9:27 AM

## 2019-12-18 NOTE — Patient Instructions (Signed)
Other Instructions Information handout on DASH diet provided.  Dr End recommends you decrease your intake of sodium.  Medication Instructions:  Your physician recommends that you continue on your current medications as directed. Please refer to the Current Medication list given to you today.  *If you need a refill on your cardiac medications before your next appointment, please call your pharmacy*   Follow-Up: At [facility], you and your health needs are our priority.  As part of our continuing mission to provide you with exceptional heart care, we have created designated Provider Care Teams.  These Care Teams include your primary Cardiologist (physician) and Advanced Practice Providers (APPs -  Physician Assistants and Nurse Practitioners) who all work together to provide you with the care you need, when you need it.  We recommend signing up for the patient portal called "MyChart".  Sign up information is provided on this After Visit Summary.  MyChart is used to connect with patients for Virtual Visits (Telemedicine).  Patients are able to view lab/test results, encounter notes, upcoming appointments, etc.  Non-urgent messages can be sent to your provider as well.   To learn more about what you can do with MyChart, go to [email or web address].    Your next appointment:   3 month(s)  The format for your next appointment:   In Person  Provider:    You may see one of the following Advanced Practice Providers on your designated Care Team:    Nicolasa Ducking, NP  Eula Listen, PA-C  Marisue Ivan, PA-C

## 2020-01-07 ENCOUNTER — Other Ambulatory Visit: Payer: Self-pay

## 2020-01-07 DIAGNOSIS — I10 Essential (primary) hypertension: Secondary | ICD-10-CM

## 2020-01-07 MED ORDER — LISINOPRIL-HYDROCHLOROTHIAZIDE 20-12.5 MG PO TABS: ORAL_TABLET | ORAL | 1 refills | Status: DC

## 2020-01-12 ENCOUNTER — Other Ambulatory Visit: Payer: Self-pay

## 2020-01-12 ENCOUNTER — Telehealth: Payer: Self-pay

## 2020-01-12 NOTE — Telephone Encounter (Signed)
I don;t understand. Last time I saw him was 10/2019 and I did not change the dose of any diabetic medication.

## 2020-01-12 NOTE — Telephone Encounter (Signed)
Talked to PT about toujeo and victoza and their copays also sent heather a message on changing dosage to make prescription cheaper

## 2020-01-13 ENCOUNTER — Other Ambulatory Visit: Payer: Self-pay | Admitting: Nurse Practitioner

## 2020-01-13 DIAGNOSIS — E1165 Type 2 diabetes mellitus with hyperglycemia: Secondary | ICD-10-CM

## 2020-01-13 MED ORDER — TOUJEO SOLOSTAR 300 UNIT/ML ~~LOC~~ SOPN: PEN_INJECTOR | SUBCUTANEOUS | 3 refills | Status: DC

## 2020-01-13 NOTE — Telephone Encounter (Signed)
Pt advised we fix pres toujeo  take 35 units daily

## 2020-01-13 NOTE — Telephone Encounter (Signed)
Changed dosing instructions and sent new prescription to his pharmacy.

## 2020-01-23 ENCOUNTER — Telehealth: Payer: Self-pay

## 2020-01-23 NOTE — Telephone Encounter (Signed)
Confirmed and screened for 01-27-20 ov. 

## 2020-01-27 ENCOUNTER — Other Ambulatory Visit: Payer: Self-pay

## 2020-01-27 ENCOUNTER — Ambulatory Visit (INDEPENDENT_AMBULATORY_CARE_PROVIDER_SITE_OTHER): Payer: BC Managed Care – PPO | Admitting: Nurse Practitioner

## 2020-01-27 ENCOUNTER — Encounter: Payer: Self-pay | Admitting: Nurse Practitioner

## 2020-01-27 VITALS — BP 151/87 | HR 69 | Temp 97.5°F | Resp 16 | Ht 74.0 in | Wt 216.0 lb

## 2020-01-27 DIAGNOSIS — I499 Cardiac arrhythmia, unspecified: Secondary | ICD-10-CM | POA: Diagnosis not present

## 2020-01-27 DIAGNOSIS — E1165 Type 2 diabetes mellitus with hyperglycemia: Secondary | ICD-10-CM

## 2020-01-27 DIAGNOSIS — J301 Allergic rhinitis due to pollen: Secondary | ICD-10-CM | POA: Diagnosis not present

## 2020-01-27 DIAGNOSIS — J0110 Acute frontal sinusitis, unspecified: Secondary | ICD-10-CM | POA: Diagnosis not present

## 2020-01-27 DIAGNOSIS — I10 Essential (primary) hypertension: Secondary | ICD-10-CM

## 2020-01-27 DIAGNOSIS — Z794 Long term (current) use of insulin: Secondary | ICD-10-CM | POA: Diagnosis not present

## 2020-01-27 LAB — POCT GLYCOSYLATED HEMOGLOBIN (HGB A1C): Hemoglobin A1C: 7.7 % — AB (ref 4.0–5.6)

## 2020-01-27 MED ORDER — AMOXICILLIN 875 MG PO TABS: 875.0000 mg | ORAL_TABLET | Freq: Two times a day (BID) | ORAL | 0 refills | Status: DC

## 2020-01-27 NOTE — Progress Notes (Signed)
[facility] [address] [place], [place] [address]  Internal MEDICINE  Office Visit Note  Patient Name: Gabriel Burton  [id number]  [id number]  Date of Service: 01/27/2020  Chief Complaint  Patient presents with  . Follow-up  . Diabetes  . Hypertension  . Quality Metric Gaps    TDAP    The patient is here for routine follow up. Blood sugars are stable, but also a little high. HgbA1c 7.7 today which is unchanged from his last visit. He is currently taking Toujeo 35units and victoza 1.8mg  daily.  He has nasal congestion, sinus type headache, and watering eyes. Symptoms have been going on for about a week and not getting better. He does not take any medication for allergies.  Blood pressure slightly elevated today. Generally well controlled.       Current Medication: Outpatient Encounter Medications as of 01/27/2020  Medication Sig  . amLODipine (NORVASC) 10 MG tablet Take 1 tablet (10 mg total) by mouth daily.  Marland Kitchen aspirin EC 81 MG tablet Take 81 mg by mouth daily.   Marland Kitchen atorvastatin (LIPITOR) 10 MG tablet Take 1 tablet (10 mg total) by mouth at bedtime.  . carvedilol (COREG) 12.5 MG tablet Take 1 tablet (12.5 mg total) by mouth 2 (two) times daily with a meal.  . Cholecalciferol (VITAMIN D3) 5000 units TABS Take 5,000 Units by mouth daily.   Marland Kitchen CINNAMON PO Take by mouth daily.   . fluticasone (FLONASE) 50 MCG/ACT nasal spray Place 2 sprays into both nostrils daily.  Marland Kitchen gabapentin (NEURONTIN) 100 MG capsule Take 1 capsule (100 mg total) by mouth daily.  . Insulin Pen Needle (BD PEN NEEDLE NANO U/F) 32G X 4 MM MISC USE AS DIRECTED  TWICE  A DAILY DIAG E11.65  . liraglutide (VICTOZA) 18 MG/3ML SOPN Inject 0.3 mLs (1.8 mg total) into the skin daily.  Marland Kitchen lisinopril-hydrochlorothiazide (ZESTORETIC) 20-12.5 MG tablet Take 2 tab po once daily for hypertension  . pantoprazole (PROTONIX) 40 MG tablet Take 1 tablet (40 mg total) by mouth daily.  . tadalafil (CIALIS) 10 MG tablet  Take 1 tablet (10 mg total) by mouth daily as needed for erectile dysfunction.  . tamsulosin (FLOMAX) 0.4 MG CAPS capsule TAKE 1 CAPSULE BY MOUTH ONE-HALF HOUR AFTER THE SAME MEAL EACH DAY  . TOUJEO SOLOSTAR 300 UNIT/ML Solostar Pen Inject 35 units up to maximum of 45 units [place] QD.  Marland Kitchen traMADol (ULTRAM) 50 MG tablet Take 1 tablet (50 mg total) by mouth every 6 (six) hours as needed.  . vitamin C (ASCORBIC ACID) 500 MG tablet Take 500 mg by mouth daily.   Marland Kitchen amoxicillin (AMOXIL) 875 MG tablet Take 1 tablet (875 mg total) by mouth 2 (two) times daily.  . nitroGLYCERIN (NITROSTAT) 0.4 MG SL tablet Place 1 tablet (0.4 mg total) under the tongue every 5 (five) minutes as needed for chest pain.   No facility-administered encounter medications on file as of 01/27/2020.    Surgical History: History reviewed. No pertinent surgical history.  Medical History: Past Medical History:  Diagnosis Date  . Diabetes mellitus without complication (HCC)   . Hypertension     Family History: Family History  Problem Relation Age of Onset  . Hypertension Mother   . Hypertension Father   . Heart attack Brother 61    Social History   Socioeconomic History  . Marital status: Widowed    Spouse name: Not on file  . Number of children: Not on file  .  Years of education: Not on file  . Highest education level: Not on file  Occupational History  . Not on file  Tobacco Use  . Smoking status: Former Smoker    Packs/day: 0.40    Years: 8.00    Pack years: 3.20    Types: Cigarettes    Quit date: 2000    Years since quitting: 21.5  . Smokeless tobacco: Never Used  Vaping Use  . Vaping Use: Never used  Substance and Sexual Activity  . Alcohol use: Never  . Drug use: Never  . Sexual activity: Not on file  Other Topics Concern  . Not on file  Social History Narrative  . Not on file   Social Determinants of Health   Financial Resource Strain:   . Difficulty of Paying Living Expenses:   Food  Insecurity:   . Worried About Programme researcher, broadcasting/film/videounning Out of Food in the Last Year:   . Baristaan Out of Food in the Last Year:   Transportation Needs:   . Freight forwarderLack of Transportation (Medical):   Marland Kitchen. Lack of Transportation (Non-Medical):   Physical Activity:   . Days of Exercise per Week:   . Minutes of Exercise per Session:   Stress:   . Feeling of Stress :   Social Connections:   . Frequency of Communication with Friends and Family:   . Frequency of Social Gatherings with Friends and Family:   . Attends Religious Services:   . Active Member of Clubs or Organizations:   . Attends BankerClub or Organization Meetings:   Marland Kitchen. Marital Status:   Intimate Partner Violence:   . Fear of Current or Ex-Partner:   . Emotionally Abused:   Marland Kitchen. Physically Abused:   . Sexually Abused:       Review of Systems  Constitutional: Positive for fatigue. Negative for activity change, chills and unexpected weight change.  HENT: Positive for congestion, postnasal drip, rhinorrhea, sinus pressure and sinus pain. Negative for sneezing and sore throat.   Eyes:       Eyes watering.   Respiratory: Negative for cough, chest tightness, shortness of breath and wheezing.   Cardiovascular: Negative for chest pain and palpitations.  Gastrointestinal: Negative for abdominal pain, constipation, diarrhea, nausea and vomiting.  Endocrine: Negative for cold intolerance, heat intolerance, polydipsia and polyuria.       Blood sugars stable.   Musculoskeletal: Negative for arthralgias, back pain, joint swelling and neck pain.  Skin: Negative for rash.  Allergic/Immunologic: Positive for environmental allergies.  Neurological: Positive for headaches. Negative for dizziness, tremors and numbness.  Hematological: Negative for adenopathy. Does not bruise/bleed easily.  Psychiatric/Behavioral: Negative for behavioral problems (Depression), sleep disturbance and suicidal ideas. The patient is not nervous/anxious.     Today's Vitals   01/27/20 0901  BP: (!)  151/87  Pulse: 69  Resp: 16  Temp: (!) 97.5 F (36.4 C)  SpO2: 98%  Weight: 216 lb (98 kg)  Height: 6\' 2"  (1.88 m)   Body mass index is 27.73 kg/m.  Physical Exam Vitals and nursing note reviewed.  Constitutional:      General: He is not in acute distress.    Appearance: Normal appearance. He is well-developed. He is not diaphoretic.  HENT:     Head: Normocephalic and atraumatic.     Right Ear: External ear normal.     Left Ear: External ear normal.     Nose: Congestion present.     Right Sinus: Frontal sinus tenderness present.     Left  Sinus: Frontal sinus tenderness present.     Mouth/Throat:     Pharynx: No oropharyngeal exudate.  Eyes:     Pupils: Pupils are equal, round, and reactive to light.     Comments: Excessive tearing of the eyes.   Neck:     Thyroid: No thyromegaly.     Vascular: No carotid bruit or JVD.     Trachea: No tracheal deviation.  Cardiovascular:     Rate and Rhythm: Normal rate and regular rhythm.     Heart sounds: Murmur heard.  No friction rub. No gallop.      Comments: There is soft, blowing, systolic murmur present with auscultation.  Pulmonary:     Effort: Pulmonary effort is normal. No respiratory distress.     Breath sounds: Normal breath sounds. No wheezing or rales.  Chest:     Chest wall: No tenderness.  Abdominal:     Palpations: Abdomen is soft.  Musculoskeletal:        General: Normal range of motion.     Cervical back: Normal range of motion and neck supple.  Lymphadenopathy:     Cervical: No cervical adenopathy.  Skin:    General: Skin is warm and dry.  Neurological:     Mental Status: He is alert and oriented to person, place, and time.     Cranial Nerves: No cranial nerve deficit.  Psychiatric:        Mood and Affect: Mood normal.        Behavior: Behavior normal.        Thought Content: Thought content normal.        Judgment: Judgment normal.    Assessment/Plan: 1. Type 2 diabetes mellitus with hyperglycemia,  with long-term current use of insulin (HCC) - POCT HgB A1C  7.7 today, which is stable from his last visit. Currently taking toujeo 35 units every day. Advised him to gradually increase dose up to 40 units daily. continue victoza 1.8mg  daily. Monitor blood sugars every day   2. Acute non-recurrent frontal sinusitis Start amoxicillin 875mg  twice daily for 10 days. Encouraged him to use fluticasone nasal spray every day.  - amoxicillin (AMOXIL) 875 MG tablet; Take 1 tablet (875 mg total) by mouth 2 (two) times daily.  Dispense: 20 tablet; Refill: 0  3. Allergic rhinitis due to pollen, unspecified seasonality Encouraged him to try OTC zyrtec every day. Samples provided.   4. Irregular heart rhythm Stable. Continue regular visits with cardiology as scheduled.   5. Essential hypertension Stable. Continue bp medication as prescribed.   General Counseling: Clements verbalizes understanding of the findings of todays visit and agrees with plan of treatment. I have discussed any further diagnostic evaluation that may be needed or ordered today. We also reviewed his medications today. he has been encouraged to call the office with any questions or concerns that should arise related to todays visit.  Diabetes Counseling:  1. Addition of ACE inh/ ARB'S for nephroprotection. Microalbumin is updated  2. Diabetic foot care, prevention of complications. Podiatry consult 3. Exercise and lose weight.  4. Diabetic eye examination, Diabetic eye exam is updated  5. Monitor blood sugar closlely. nutrition counseling.  6. Sign and symptoms of hypoglycemia including shaking sweating,confusion and headaches.  This patient was seen by FNP Collaboration with Dr Vincent Gros as a part of collaborative care agreement  Orders Placed This Encounter  Procedures  . POCT HgB A1C    Meds ordered this encounter  Medications  .  amoxicillin (AMOXIL) 875 MG tablet    Sig: Take 1 tablet (875 mg total) by  mouth 2 (two) times daily.    Dispense:  20 tablet    Refill:  0    Order Specific Question:   Supervising Provider    Answer:   Lyndon Code [[id number]]    Total time spent: 30 Minutes   Time spent includes review of chart, medications, test results, and follow up plan with the patient.      Dr Lyndon Code Internal medicine

## 2020-02-18 ENCOUNTER — Other Ambulatory Visit: Payer: Self-pay

## 2020-02-18 MED ORDER — AMLODIPINE BESYLATE 10 MG PO TABS: 10.0000 mg | ORAL_TABLET | Freq: Every day | ORAL | 5 refills | Status: DC

## 2020-02-19 ENCOUNTER — Other Ambulatory Visit: Payer: Self-pay

## 2020-02-19 MED ORDER — AMLODIPINE BESYLATE 10 MG PO TABS: 10.0000 mg | ORAL_TABLET | Freq: Every day | ORAL | 5 refills | Status: DC

## 2020-03-23 ENCOUNTER — Other Ambulatory Visit: Payer: Self-pay

## 2020-03-23 MED ORDER — ATORVASTATIN CALCIUM 10 MG PO TABS: 10.0000 mg | ORAL_TABLET | Freq: Every day | ORAL | 3 refills | Status: DC

## 2020-03-31 ENCOUNTER — Ambulatory Visit: Payer: BC Managed Care – PPO | Admitting: Internal Medicine

## 2020-03-31 NOTE — Progress Notes (Signed)
Cardiology Office Note    Date:  04/02/2020   ID:  Gabriel Burton, DOB [birth year], MRN [id number]  PCP:  Carlean Jews, NP  Cardiologist:  Yvonne Kendall, MD  Electrophysiologist:  None   Chief Complaint: Follow up  History of Present Illness:   Gabriel Burton is a 69 y.o. male with history of chest pain, CKD stage II, DM2, HTN, HLD, and LBBB who presents for follow up of chest pain and HTN.  He was initially evaluated by Dr.Endin 10/2018 for chest pain with preceding ETT being nondiagnostic secondary to baseline LBBB.Echo, performed by outside office, from 09/2018, showed normal LV size with mild LVH with normal LVEF, diastolic dysfunction, trace mitral and mild tricuspid regurgitation, normal RV size and systolic function. At that time, he reported a 3-week history of left sided chest/shoulder/arm pain that would come and go and was noted to be improved with tramadol. It seemed to be worsened by deep inspiration. Discussion of Lexiscan Myoview was undertaken through the patient preferred to defer this in favor of medical therapy. He was seen by Dr. Okey Dupre on 12/27/2018 noting the above symptoms resolved with use of naproxen. However, he continuedto note occasional vague chest discomfort from time to time with location varying and intermittent. Symptoms were nonexertional and would last for 5 minutes or less. He was started on Coreg.He underwent Lexiscan Myoview on 01/02/2019 which showed no significant ischemia, fixeddefect of theseptal wall, apical regionwith hypokinesis of the septal wall with an EF estimated at 43% with perfusion defect and depressed EF possibly secondary to underlying LBBB. Very mild coronary artery calcification and aortic atherosclerosis noted on CT imaging. Overall, this was a low to moderate risk scan.  He was seen in in the office in 01/2019 and was doing well from a cardiac perspective. With the addition of Coreg, he noted almost complete resolution  of his chest pain.  He continued to note intermittent flank/back pain that improved with naproxen/tramadol.  Coreg was titrated to 12.5 mg bid.  He was last seen in the office in 12/2019, and was doing well with some sporadic episodes of transient chest pain occurring every few weeks in which he would use prn SL NTG.  Symptoms were nonexertional.  Given stable symptoms, continued medical therapy was advised.  He was noted to be adding salt to his food.  He deferred secondary hypertension workup and medication changes.   He comes in doing well from a cardiac perspective.  Since he was last seen he has not needed any sublingual nitroglycerin and has not had any chest pain.  He denies any shortness of breath, palpitations, lightheadedness, or lower extremity swelling.  His BP at home typically runs in the 140s over 80s to 90s.  He does try to limit his sodium intake though frequently has hot pockets in the morning and eats out at restaurants often.  He also sometimes adds salt to his food.  He is tolerating all medications.   Labs independently reviewed: 01/2020 - A1c 7.7 10/2019 - TSH normal, TC 148, TG 76, HDL 66, LDL 67, HGB 14.2, PLT 206, BUN 13, SCr 1.37, potassium 3.8, albumin 4.5, AST/ALT normal  Past Medical History:  Diagnosis Date  . Diabetes mellitus without complication (HCC)   . Hypertension     History reviewed. No pertinent surgical history.  Current Medications: Current Meds  Medication Sig  . amLODipine (NORVASC) 10 MG tablet Take 1 tablet (10 mg total) by mouth daily.  Marland Kitchen amoxicillin (AMOXIL)  875 MG tablet Take 1 tablet (875 mg total) by mouth 2 (two) times daily.  Marland Kitchen aspirin EC 81 MG tablet Take 81 mg by mouth daily.   Marland Kitchen atorvastatin (LIPITOR) 10 MG tablet Take 1 tablet (10 mg total) by mouth at bedtime.  . carvedilol (COREG) 12.5 MG tablet Take 1 tablet (12.5 mg total) by mouth 2 (two) times daily with a meal.  . Cholecalciferol (VITAMIN D3) 5000 units TABS Take 5,000 Units by  mouth daily.   Marland Kitchen CINNAMON PO Take by mouth daily.   . fluticasone (FLONASE) 50 MCG/ACT nasal spray Place 2 sprays into both nostrils daily.  Marland Kitchen gabapentin (NEURONTIN) 100 MG capsule Take 1 capsule (100 mg total) by mouth daily.  . Insulin Pen Needle (BD PEN NEEDLE NANO U/F) 32G X 4 MM MISC USE AS DIRECTED  TWICE  A DAILY DIAG E11.65  . liraglutide (VICTOZA) 18 MG/3ML SOPN Inject 0.3 mLs (1.8 mg total) into the skin daily.  Marland Kitchen lisinopril-hydrochlorothiazide (ZESTORETIC) 20-12.5 MG tablet Take 2 tab po once daily for hypertension  . pantoprazole (PROTONIX) 40 MG tablet Take 1 tablet (40 mg total) by mouth daily.  . tadalafil (CIALIS) 10 MG tablet Take 1 tablet (10 mg total) by mouth daily as needed for erectile dysfunction.  . tamsulosin (FLOMAX) 0.4 MG CAPS capsule TAKE 1 CAPSULE BY MOUTH ONE-HALF HOUR AFTER THE SAME MEAL EACH DAY  . TOUJEO SOLOSTAR 300 UNIT/ML Solostar Pen Inject 35 units up to maximum of 45 units Hart QD.  Marland Kitchen traMADol (ULTRAM) 50 MG tablet Take 1 tablet (50 mg total) by mouth every 6 (six) hours as needed.  . vitamin C (ASCORBIC ACID) 500 MG tablet Take 500 mg by mouth daily.     Allergies:   Patient has no known allergies.   Social History   Socioeconomic History  . Marital status: Widowed    Spouse name: Not on file  . Number of children: Not on file  . Years of education: Not on file  . Highest education level: Not on file  Occupational History  . Not on file  Tobacco Use  . Smoking status: Former Smoker    Packs/day: 0.40    Years: 8.00    Pack years: 3.20    Types: Cigarettes    Quit date: 2000    Years since quitting: 21.7  . Smokeless tobacco: Never Used  Vaping Use  . Vaping Use: Never used  Substance and Sexual Activity  . Alcohol use: Never  . Drug use: Never  . Sexual activity: Not on file  Other Topics Concern  . Not on file  Social History Narrative  . Not on file   Social Determinants of Health   Financial Resource Strain:   . Difficulty of  Paying Living Expenses: Not on file  Food Insecurity:   . Worried About Programme researcher, broadcasting/film/video in the Last Year: Not on file  . Ran Out of Food in the Last Year: Not on file  Transportation Needs:   . Lack of Transportation (Medical): Not on file  . Lack of Transportation (Non-Medical): Not on file  Physical Activity:   . Days of Exercise per Week: Not on file  . Minutes of Exercise per Session: Not on file  Stress:   . Feeling of Stress : Not on file  Social Connections:   . Frequency of Communication with Friends and Family: Not on file  . Frequency of Social Gatherings with Friends and Family: Not on file  .  Attends Religious Services: Not on file  . Active Member of Clubs or Organizations: Not on file  . Attends Banker Meetings: Not on file  . Marital Status: Not on file     Family History:  The patient's family history includes Heart attack (age of onset: 12) in his brother; Hypertension in his father and mother.  ROS:   Review of Systems  Constitutional: Negative for chills, diaphoresis, fever, malaise/fatigue and weight loss.  HENT: Negative for congestion.   Eyes: Negative for discharge and redness.  Respiratory: Negative for cough, sputum production, shortness of breath and wheezing.   Cardiovascular: Negative for chest pain, palpitations, orthopnea, claudication, leg swelling and PND.  Gastrointestinal: Negative for abdominal pain, heartburn, nausea and vomiting.  Musculoskeletal: Negative for falls and myalgias.  Skin: Negative for rash.  Neurological: Negative for dizziness, tingling, tremors, sensory change, speech change, focal weakness, loss of consciousness and weakness.  Endo/Heme/Allergies: Does not bruise/bleed easily.  Psychiatric/Behavioral: Negative for substance abuse. The patient is not nervous/anxious.   All other systems reviewed and are negative.    EKGs/Labs/Other Studies Reviewed:    Studies reviewed were summarized above. The  additional studies were reviewed today: As above.   EKG:  EKG is ordered today.  The EKG ordered today demonstrates NSR, 71 bpm, LBBB, improved 1st degree AV block  Recent Labs: 10/24/2019: ALT 38; BUN 13; Creatinine, Ser 1.37; Hemoglobin 14.2; Platelets 206; Potassium 3.8; Sodium 142; TSH 1.010  Recent Lipid Panel    Component Value Date/Time   CHOL 148 10/24/2019 1514   TRIG 76 10/24/2019 1514   HDL 66 10/24/2019 1514   LDLCALC 67 10/24/2019 1514    PHYSICAL EXAM:    VS:  BP (!) 148/88 (BP Location: Left Arm, Patient Position: Sitting, Cuff Size: Normal)   Pulse 71   Ht 6\' 2"  (1.88 m)   Wt 215 lb 4 oz (97.6 kg)   SpO2 98%   BMI 27.64 kg/m   BMI: Body mass index is 27.64 kg/m.  Physical Exam Constitutional:      Appearance: He is well-developed.  HENT:     Head: Normocephalic and atraumatic.  Eyes:     General:        Right eye: No discharge.        Left eye: No discharge.  Neck:     Vascular: No JVD.  Cardiovascular:     Rate and Rhythm: Normal rate and regular rhythm.     Pulses: No midsystolic click and no opening snap.          Posterior tibial pulses are 2+ on the right side and 2+ on the left side.     Heart sounds: Normal heart sounds, S1 normal and S2 normal. Heart sounds not distant. No murmur heard.  No friction rub.  Pulmonary:     Effort: Pulmonary effort is normal. No respiratory distress.     Breath sounds: Normal breath sounds. No decreased breath sounds, wheezing or rales.  Chest:     Chest wall: No tenderness.  Abdominal:     General: There is no distension.     Palpations: Abdomen is soft.     Tenderness: There is no abdominal tenderness.  Musculoskeletal:     Cervical back: Normal range of motion.  Skin:    General: Skin is warm and dry.     Nails: There is no clubbing.  Neurological:     Mental Status: He is alert and oriented to person, place, and  time.  Psychiatric:        Speech: Speech normal.        Behavior: Behavior normal.         Thought Content: Thought content normal.        Judgment: Judgment normal.     Wt Readings from Last 3 Encounters:  04/02/20 215 lb 4 oz (97.6 kg)  01/27/20 216 lb (98 kg)  12/18/19 213 lb 6 oz (96.8 kg)     ASSESSMENT & PLAN:   1. Atypical chest pain with coronary artery calcification: Since he was last seen, he has not had any further episodes of chest pain and has not needed any sublingual nitroglycerin.  In the setting of prior ischemic testing, including Lexiscan MPI being a low risk, and with improvement in symptoms further testing is deferred at this time.  Continue current medical therapy including aspirin, amlodipine, carvedilol, and Lipitor.  2. Left bundle branch block/first-degree AV block: Asymptomatic.  Chronic findings with improvement in PR interval noted on today's tracing.  With improvement noted in his PR interval he remains on carvedilol without dose escalation.  Continue to monitor on periodic EKGs.  3. HTN: Blood pressure is elevated BP continues to appear to be in the setting of excessive sodium intake as he frequently eats hot pockets for breakfast and eats restaurant takeout food for most meals.  He also continues to apply salt to food intermittently.  Detailed discussion regarding limiting sodium intake.  Chronic first-degree AV block precludes escalation of carvedilol for further management of his hypertension.  Add Imdur 30 mg daily.  Otherwise, he will continue current dose of amlodipine, carvedilol, and lisinopril/HCTZ.  If with successful limiting of sodium his BP remains elevated in follow-up we will plan for renal artery ultrasound and serum aldosterone/plasma renin activity.  Could consider addition of spironolactone as well.  4. HLD: LDL 67 from 10/2019 with normal LFT at that time.  He remains on atorvastatin.  Disposition: F/u with Dr. Okey DupreEnd or an APP in 3 months (patient preference).   Medication Adjustments/Labs and Tests Ordered: Current medicines are  reviewed at length with the patient today.  Concerns regarding medicines are outlined above. Medication changes, Labs and Tests ordered today are summarized above and listed in the Patient Instructions accessible in Encounters.   Signed, Eula Listenyan Melik Blancett, PA-C 04/02/2020 10:13 AM     [facility] [address] [place], [place] [address] [phone]

## 2020-03-31 NOTE — Progress Notes (Deleted)
   Follow-up Outpatient Visit Date: 03/31/2020  Primary Care Provider: Carlean Jews, NP [address] [place] [address]  Chief Complaint: ***  HPI:  Gabriel Burton is a 69 y.o. male with history of hypertension, hypertipidemia, LBBB and diabetes mellitus, who presents for follow-up of chest pain and hypertension.  I last saw him in June, at which time [name] noted sporadic episodes of transient chest pain for which he was using prn nitroglycerin.  We did not make any medication changes at that time or pursue further testing.  --------------------------------------------------------------------------------------------------  Past Medical History:  Diagnosis Date  . Diabetes mellitus without complication (HCC)   . Hypertension    No past surgical history on file.   Recent CV Pertinent Labs: Lab Results  Component Value Date   CHOL 148 10/24/2019   HDL 66 10/24/2019   LDLCALC 67 10/24/2019   TRIG 76 10/24/2019   K 3.8 10/24/2019   BUN 13 10/24/2019   CREATININE 1.37 (H) 10/24/2019    Past medical and surgical history were reviewed and updated in EPIC.  No outpatient medications have been marked as taking for the 03/31/20 encounter (Appointment) with Johnn Krasowski, Cristal Deer, MD.    Allergies: Patient has no known allergies.  Social History   Tobacco Use  . Smoking status: Former Smoker    Packs/day: 0.40    Years: 8.00    Pack years: 3.20    Types: Cigarettes    Quit date: 2000    Years since quitting: 21.7  . Smokeless tobacco: Never Used  Vaping Use  . Vaping Use: Never used  Substance Use Topics  . Alcohol use: Never  . Drug use: Never    Family History  Problem Relation Age of Onset  . Hypertension Mother   . Hypertension Father   . Heart attack Brother 42    Review of Systems: A 12-system review of systems was performed and was negative except as noted in the  HPI.  --------------------------------------------------------------------------------------------------  Physical Exam: There were no vitals taken for this visit.  General:  *** HEENT: No conjunctival pallor or scleral icterus. Facemask in place. Neck: Supple without lymphadenopathy, thyromegaly, JVD, or HJR. Lungs: Normal work of breathing. Clear to auscultation bilaterally without wheezes or crackles. Heart: Regular rate and rhythm without murmurs, rubs, or gallops. Non-displaced PMI. Abd: Bowel sounds present. Soft, NT/ND without hepatosplenomegaly Ext: No lower extremity edema. Radial, PT, and DP pulses are 2+ bilaterally. Skin: Warm and dry without rash.  EKG:  ***  Lab Results  Component Value Date   WBC 4.7 10/24/2019   HGB 14.2 10/24/2019   HCT 43.0 10/24/2019   MCV 88 10/24/2019   PLT 206 10/24/2019    Lab Results  Component Value Date   NA 142 10/24/2019   K 3.8 10/24/2019   CL 101 10/24/2019   CO2 26 10/24/2019   BUN 13 10/24/2019   CREATININE 1.37 (H) 10/24/2019   GLUCOSE 89 10/24/2019   ALT 38 10/24/2019    Lab Results  Component Value Date   CHOL 148 10/24/2019   HDL 66 10/24/2019   LDLCALC 67 10/24/2019   TRIG 76 10/24/2019    --------------------------------------------------------------------------------------------------  ASSESSMENT AND PLAN: Cristal Deer Rukiya Hodgkins, MD 03/31/2020 12:52 AM

## 2020-04-02 ENCOUNTER — Other Ambulatory Visit: Payer: Self-pay

## 2020-04-02 ENCOUNTER — Encounter: Payer: Self-pay | Admitting: Physician Assistant

## 2020-04-02 ENCOUNTER — Ambulatory Visit: Payer: BC Managed Care – PPO | Admitting: Physician Assistant

## 2020-04-02 VITALS — BP 148/88 | HR 71 | Ht 74.0 in | Wt 215.2 lb

## 2020-04-02 DIAGNOSIS — I2584 Coronary atherosclerosis due to calcified coronary lesion: Secondary | ICD-10-CM

## 2020-04-02 DIAGNOSIS — R0789 Other chest pain: Secondary | ICD-10-CM

## 2020-04-02 DIAGNOSIS — I2510 Atherosclerotic heart disease of native coronary artery without angina pectoris: Secondary | ICD-10-CM

## 2020-04-02 DIAGNOSIS — I10 Essential (primary) hypertension: Secondary | ICD-10-CM

## 2020-04-02 DIAGNOSIS — I447 Left bundle-branch block, unspecified: Secondary | ICD-10-CM

## 2020-04-02 DIAGNOSIS — E785 Hyperlipidemia, unspecified: Secondary | ICD-10-CM

## 2020-04-02 DIAGNOSIS — I440 Atrioventricular block, first degree: Secondary | ICD-10-CM | POA: Diagnosis not present

## 2020-04-02 DIAGNOSIS — I700 Atherosclerosis of aorta: Secondary | ICD-10-CM | POA: Diagnosis not present

## 2020-04-02 MED ORDER — ISOSORBIDE MONONITRATE ER 30 MG PO TB24: 30.0000 mg | ORAL_TABLET | Freq: Every day | ORAL | 6 refills | Status: DC

## 2020-04-02 NOTE — Patient Instructions (Signed)
Medication Instructions:  -Your physician has recommended you make the following change in your medication:   1) START imdur (isosorbide) 30 mg- take 1 tablet by mouth once daily    *If you need a refill on your cardiac medications before your next appointment, please call your pharmacy*   Lab Work: - none ordered  If you have labs (blood work) drawn today and your tests are completely normal, you will receive your results only by:  MyChart Message (if you have MyChart) OR  A paper copy in the mail If you have any lab test that is abnormal or we need to change your treatment, we will call you to review the results.   Testing/Procedures: - none ordered   Follow-Up: At [facility], you and your health needs are our priority.  As part of our continuing mission to provide you with exceptional heart care, we have created designated Provider Care Teams.  These Care Teams include your primary Cardiologist (physician) and Advanced Practice Providers (APPs -  Physician Assistants and Nurse Practitioners) who all work together to provide you with the care you need, when you need it.  We recommend signing up for the patient portal called "MyChart".  Sign up information is provided on this After Visit Summary.  MyChart is used to connect with patients for Virtual Visits (Telemedicine).  Patients are able to view lab/test results, encounter notes, upcoming appointments, etc.  Non-urgent messages can be sent to your provider as well.   To learn more about what you can do with MyChart, go to [email or web address].    Your next appointment:   3 month(s)  The format for your next appointment:   In Person  Provider:    You may see Yvonne Kendall, MD or one of the following Advanced Practice Providers on your designated Care Team:    Nicolasa Ducking, NP  Eula Listen, PA-C  Marisue Ivan, PA-C    Other Instructions  Isosorbide Mononitrate extended-release tablets What is this  medicine? ISOSORBIDE MONONITRATE (eye soe SOR bide mon oh NYE trate) is a vasodilator. It relaxes blood vessels, increasing the blood and oxygen supply to your heart. This medicine is used to prevent chest pain caused by angina. It will not help to stop an episode of chest pain. This medicine may be used for other purposes; ask your health care provider or pharmacist if you have questions. COMMON BRAND NAME(S): Imdur, Isotrate ER What should I tell my health care provider before I take this medicine? They need to know if you have any of these conditions:  previous heart attack or heart failure  an unusual or allergic reaction to isosorbide mononitrate, nitrates, other medicines, foods, dyes, or preservatives  pregnant or trying to get pregnant  breast-feeding How should I use this medicine? Take this medicine by mouth with a glass of water. Follow the directions on the prescription label. Do not crush or chew. Take your medicine at regular intervals. Do not take your medicine more often than directed. Do not stop taking this medicine except on the advice of your doctor or health care professional. Talk to your pediatrician regarding the use of this medicine in children. Special care may be needed. Overdosage: If you think you have taken too much of this medicine contact a poison control center or emergency room at once. NOTE: This medicine is only for you. Do not share this medicine with others. What if I miss a dose? If you miss a dose, take it as soon  as you can. If it is almost time for your next dose, take only that dose. Do not take double or extra doses. What may interact with this medicine? Do not take this medicine with any of the following medications:  medicines used to treat erectile dysfunction (ED) like avanafil, sildenafil, tadalafil, and vardenafil  riociguat This medicine may also interact with the following medications:  medicines for high blood pressure  other  medicines for angina or heart failure This list may not describe all possible interactions. Give your health care provider a list of all the medicines, herbs, non-prescription drugs, or dietary supplements you use. Also tell them if you smoke, drink alcohol, or use illegal drugs. Some items may interact with your medicine. What should I watch for while using this medicine? Check your heart rate and blood pressure regularly while you are taking this medicine. Ask your doctor or health care professional what your heart rate and blood pressure should be and when you should contact him or her. Tell your doctor or health care professional if you feel your medicine is no longer working. You may get dizzy. Do not drive, use machinery, or do anything that needs mental alertness until you know how this medicine affects you. To reduce the risk of dizzy or fainting spells, do not sit or stand up quickly, especially if you are an older patient. Alcohol can make you more dizzy, and increase flushing and rapid heartbeats. Avoid alcoholic drinks. Do not treat yourself for coughs, colds, or pain while you are taking this medicine without asking your doctor or health care professional for advice. Some ingredients may increase your blood pressure. What side effects may I notice from receiving this medicine? Side effects that you should report to your doctor or health care professional as soon as possible:  bluish discoloration of lips, fingernails, or palms of hands  irregular heartbeat, palpitations  low blood pressure  nausea, vomiting  persistent headache  unusually weak or tired Side effects that usually do not require medical attention (report to your doctor or health care professional if they continue or are bothersome):  flushing of the face or neck  rash This list may not describe all possible side effects. Call your doctor for medical advice about side effects. You may report side effects to FDA at  1-800-FDA-1088. Where should I keep my medicine? Keep out of the reach of children. Store between 15 and 30 degrees C (59 and 86 degrees F). Keep container tightly closed. Throw away any unused medicine after the expiration date. NOTE: This sheet is a summary. It may not cover all possible information. If you have questions about this medicine, talk to your doctor, pharmacist, or health care provider.  2020 Elsevier/Gold Standard (2013-05-02 14:48:19)

## 2020-04-26 ENCOUNTER — Other Ambulatory Visit: Payer: Self-pay

## 2020-04-26 ENCOUNTER — Ambulatory Visit: Payer: BC Managed Care – PPO | Admitting: Nurse Practitioner

## 2020-04-26 ENCOUNTER — Encounter: Payer: Self-pay | Admitting: Nurse Practitioner

## 2020-04-26 VITALS — BP 134/82 | HR 75 | Temp 98.3°F | Resp 16 | Ht 74.0 in | Wt 216.0 lb

## 2020-04-26 DIAGNOSIS — I10 Essential (primary) hypertension: Secondary | ICD-10-CM | POA: Diagnosis not present

## 2020-04-26 DIAGNOSIS — E1165 Type 2 diabetes mellitus with hyperglycemia: Secondary | ICD-10-CM | POA: Diagnosis not present

## 2020-04-26 DIAGNOSIS — I447 Left bundle-branch block, unspecified: Secondary | ICD-10-CM

## 2020-04-26 DIAGNOSIS — Z794 Long term (current) use of insulin: Secondary | ICD-10-CM | POA: Diagnosis not present

## 2020-04-26 DIAGNOSIS — Z23 Encounter for immunization: Secondary | ICD-10-CM | POA: Diagnosis not present

## 2020-04-26 LAB — POCT GLYCOSYLATED HEMOGLOBIN (HGB A1C): Hemoglobin A1C: 8.1 % — AB (ref 4.0–5.6)

## 2020-04-26 MED ORDER — VICTOZA 18 MG/3ML ~~LOC~~ SOPN: 1.8000 mg | PEN_INJECTOR | Freq: Every day | SUBCUTANEOUS | 3 refills | Status: DC

## 2020-04-26 MED ORDER — TOUJEO SOLOSTAR 300 UNIT/ML ~~LOC~~ SOPN: PEN_INJECTOR | SUBCUTANEOUS | 3 refills | Status: DC

## 2020-04-26 MED ORDER — GLUCOSE BLOOD VI STRP: ORAL_STRIP | 12 refills | Status: DC

## 2020-04-26 NOTE — Progress Notes (Signed)
[facility] [address] [place], [place] [address]  Internal MEDICINE  Office Visit Note  Patient Name: Gabriel Burton  [id number]  [id number]  Date of Service: 04/26/2020  Chief Complaint  Patient presents with  . Follow-up  . Diabetes    A1C  . Hypertension  . policy update form    received    The patient is here for routine follow up. Blood sugars are a bit more elevated today. HgbA1c is 8.1, up from 7.7 at his last visit. He states that he is taking his toujeo at 35 units every day and victoza 1.8mg  daily. He has not been monitoring his sugars. States that he ran out of strips and [facility] is no longer selling that kind of strips and glucose monitor.       Current Medication: Outpatient Encounter Medications as of 04/26/2020  Medication Sig  . amLODipine (NORVASC) 10 MG tablet Take 1 tablet (10 mg total) by mouth daily.  Marland Kitchen aspirin EC 81 MG tablet Take 81 mg by mouth daily.   Marland Kitchen atorvastatin (LIPITOR) 10 MG tablet Take 1 tablet (10 mg total) by mouth at bedtime.  . carvedilol (COREG) 12.5 MG tablet Take 1 tablet (12.5 mg total) by mouth 2 (two) times daily with a meal.  . Cholecalciferol (VITAMIN D3) 5000 units TABS Take 5,000 Units by mouth daily.   Marland Kitchen CINNAMON PO Take by mouth daily.   . fluticasone (FLONASE) 50 MCG/ACT nasal spray Place 2 sprays into both nostrils daily.  Marland Kitchen gabapentin (NEURONTIN) 100 MG capsule Take 1 capsule (100 mg total) by mouth daily.  . Insulin Pen Needle (BD PEN NEEDLE NANO U/F) 32G X 4 MM MISC USE AS DIRECTED  TWICE  A DAILY DIAG E11.65  . isosorbide mononitrate (IMDUR) 30 MG 24 hr tablet Take 1 tablet (30 mg total) by mouth daily.  Marland Kitchen liraglutide (VICTOZA) 18 MG/3ML SOPN Inject 1.8 mg into the skin daily.  Marland Kitchen lisinopril-hydrochlorothiazide (ZESTORETIC) 20-12.5 MG tablet Take 2 tab po once daily for hypertension  . pantoprazole (PROTONIX) 40 MG tablet Take 1 tablet (40 mg total) by mouth daily.  . tadalafil (CIALIS) 10 MG tablet Take  1 tablet (10 mg total) by mouth daily as needed for erectile dysfunction.  . tamsulosin (FLOMAX) 0.4 MG CAPS capsule TAKE 1 CAPSULE BY MOUTH ONE-HALF HOUR AFTER THE SAME MEAL EACH DAY  . TOUJEO SOLOSTAR 300 UNIT/ML Solostar Pen Inject 40 units [place] daily. May increase up to 45 units as indicated.  . vitamin C (ASCORBIC ACID) 500 MG tablet Take 500 mg by mouth daily.   . [DISCONTINUED] amoxicillin (AMOXIL) 875 MG tablet Take 1 tablet (875 mg total) by mouth 2 (two) times daily.  . [DISCONTINUED] liraglutide (VICTOZA) 18 MG/3ML SOPN Inject 0.3 mLs (1.8 mg total) into the skin daily.  . [DISCONTINUED] TOUJEO SOLOSTAR 300 UNIT/ML Solostar Pen Inject 35 units up to maximum of 45 units [place] QD.  . [DISCONTINUED] traMADol (ULTRAM) 50 MG tablet Take 1 tablet (50 mg total) by mouth every 6 (six) hours as needed.  Marland Kitchen glucose blood test strip Check blood sugars TID  . nitroGLYCERIN (NITROSTAT) 0.4 MG SL tablet Place 1 tablet (0.4 mg total) under the tongue every 5 (five) minutes as needed for chest pain.   No facility-administered encounter medications on file as of 04/26/2020.    Surgical History: History reviewed. No pertinent surgical history.  Medical History: Past Medical History:  Diagnosis Date  . Diabetes mellitus without complication (HCC)   .  Hypertension     Family History: Family History  Problem Relation Age of Onset  . Hypertension Mother   . Hypertension Father   . Heart attack Brother 77    Social History   Socioeconomic History  . Marital status: Widowed    Spouse name: Not on file  . Number of children: Not on file  . Years of education: Not on file  . Highest education level: Not on file  Occupational History  . Not on file  Tobacco Use  . Smoking status: Former Smoker    Packs/day: 0.40    Years: 8.00    Pack years: 3.20    Types: Cigarettes    Quit date: 2000    Years since quitting: 21.7  . Smokeless tobacco: Never Used  Vaping Use  . Vaping Use: Never used   Substance and Sexual Activity  . Alcohol use: Never  . Drug use: Never  . Sexual activity: Not on file  Other Topics Concern  . Not on file  Social History Narrative  . Not on file   Social Determinants of Health   Financial Resource Strain:   . Difficulty of Paying Living Expenses: Not on file  Food Insecurity:   . Worried About Programme researcher, broadcasting/film/video in the Last Year: Not on file  . Ran Out of Food in the Last Year: Not on file  Transportation Needs:   . Lack of Transportation (Medical): Not on file  . Lack of Transportation (Non-Medical): Not on file  Physical Activity:   . Days of Exercise per Week: Not on file  . Minutes of Exercise per Session: Not on file  Stress:   . Feeling of Stress : Not on file  Social Connections:   . Frequency of Communication with Friends and Family: Not on file  . Frequency of Social Gatherings with Friends and Family: Not on file  . Attends Religious Services: Not on file  . Active Member of Clubs or Organizations: Not on file  . Attends Banker Meetings: Not on file  . Marital Status: Not on file  Intimate Partner Violence:   . Fear of Current or Ex-Partner: Not on file  . Emotionally Abused: Not on file  . Physically Abused: Not on file  . Sexually Abused: Not on file      Review of Systems  Constitutional: Negative for activity change, chills, fatigue and unexpected weight change.  HENT: Negative for congestion, postnasal drip, rhinorrhea, sneezing and sore throat.   Respiratory: Negative for cough, chest tightness, shortness of breath and wheezing.   Cardiovascular: Negative for chest pain and palpitations.  Gastrointestinal: Negative for abdominal pain, constipation, diarrhea, nausea and vomiting.  Endocrine: Negative for cold intolerance, heat intolerance, polydipsia and polyuria.       Blood sugars have been higher than normal these past few months. States that he has not been checking his blood sugars recently. Ran  out of strips and has not been able to get new ones as [facility] has stopped selling the kind he used.   Genitourinary: Positive for frequency.  Musculoskeletal: Negative for arthralgias, back pain, joint swelling and neck pain.  Skin: Negative for rash.  Allergic/Immunologic: Positive for environmental allergies.  Neurological: Negative for dizziness, tremors, numbness and headaches.  Hematological: Negative for adenopathy. Does not bruise/bleed easily.  Psychiatric/Behavioral: Negative for behavioral problems (Depression), sleep disturbance and suicidal ideas. The patient is not nervous/anxious.     Today's Vitals   04/26/20 0925  BP:  134/82  Pulse: 75  Resp: 16  Temp: 98.3 F (36.8 C)  SpO2: 98%  Weight: 216 lb (98 kg)  Height:  (1.88 m)   Body mass index is 27.73 kg/m.  Physical Exam Vitals and nursing note reviewed.  Constitutional:      General: He is not in acute distress.    Appearance: Normal appearance. He is well-developed. He is not diaphoretic.  HENT:     Head: Normocephalic and atraumatic.     Nose: Nose normal.     Mouth/Throat:     Pharynx: No oropharyngeal exudate.  Eyes:     Pupils: Pupils are equal, round, and reactive to light.  Neck:     Thyroid: No thyromegaly.     Vascular: No carotid bruit or JVD.     Trachea: No tracheal deviation.  Cardiovascular:     Rate and Rhythm: Normal rate and regular rhythm.     Heart sounds: Murmur heard.  No friction rub. No gallop.      Comments: There is soft, blowing, systolic murmur present with auscultation.  Pulmonary:     Effort: Pulmonary effort is normal. No respiratory distress.     Breath sounds: Normal breath sounds. No wheezing or rales.  Chest:     Chest wall: No tenderness.  Abdominal:     Palpations: Abdomen is soft.  Musculoskeletal:        General: Normal range of motion.     Cervical back: Normal range of motion and neck supple.  Lymphadenopathy:     Cervical: No cervical adenopathy.   Skin:    General: Skin is warm and dry.  Neurological:     Mental Status: He is alert and oriented to person, place, and time.     Cranial Nerves: No cranial nerve deficit.  Psychiatric:        Mood and Affect: Mood normal.        Behavior: Behavior normal.        Thought Content: Thought content normal.        Judgment: Judgment normal.     Assessment/Plan:  1. Type 2 diabetes mellitus with hyperglycemia, with long-term current use of insulin (HCC) - POCT HgB A1C  8.1 today, up from 7.7 at his most recent check. Increased toujeo to 40 units daily. May continue to increase up to 45 units as inidcated. New glucose monitor given. Advised him to check blood sugars three times daily, once fist thing in the morning, and twice, prior to eating. He agrees to bring blood sugar log to his next visit.  - glucose blood test strip; Check blood sugars TID  Dispense: 100 each; Refill: 12 - liraglutide (VICTOZA) 18 MG/3ML SOPN; Inject 1.8 mg into the skin daily.  Dispense: 10 mL; Refill: 3 - TOUJEO SOLOSTAR 300 UNIT/ML Solostar Pen; Inject 40 units [place] daily. May increase up to 45 units as indicated.  Dispense: 35 mL; Refill: 3  2. Essential hypertension Stable. Continue bp medication as prescribed   3. LBBB (left bundle branch block) Stable. Continue regular visits with cardiology as scheduled.   4. Flu vaccine need Flu vaccine administered in the office today.  - Flu Vaccine MDCK QUAD PF   General Counseling: Didier verbalizes understanding of the findings of todays visit and agrees with plan of treatment. I have discussed any further diagnostic evaluation that may be needed or ordered today. We also reviewed his medications today. he has been encouraged to call the office with any questions or  concerns that should arise related to todays visit.  Diabetes Counseling:  1. Addition of ACE inh/ ARB'S for nephroprotection. Microalbumin is updated  2. Diabetic foot care, prevention of  complications. Podiatry consult 3. Exercise and lose weight.  4. Diabetic eye examination, Diabetic eye exam is updated  5. Monitor blood sugar closlely. nutrition counseling.  6. Sign and symptoms of hypoglycemia including shaking sweating,confusion and headaches.  This patient was seen by Vincent Gros FNP Collaboration with Dr Lyndon Code as a part of collaborative care agreement  Orders Placed This Encounter  Procedures  . Flu Vaccine MDCK QUAD PF  . POCT HgB A1C    Meds ordered this encounter  Medications  . TOUJEO SOLOSTAR 300 UNIT/ML Solostar Pen    Sig: Inject 40 units [place] daily. May increase up to 45 units as indicated.    Dispense:  35 mL    Refill:  3    Please note increased dosing.    Order Specific Question:   Supervising Provider    Answer:   Lyndon Code [[id number]]  . glucose blood test strip    Sig: Check blood sugars TID    Dispense:  100 each    Refill:  12    Patient was given Accu-chek Guide me glucose monitor in the office today    Order Specific Question:   Supervising Provider    Answer:   Lyndon Code [[id number]]  . liraglutide (VICTOZA) 18 MG/3ML SOPN    Sig: Inject 1.8 mg into the skin daily.    Dispense:  10 mL    Refill:  3    Order Specific Question:   Supervising Provider    Answer:   Lyndon Code [[id number]]    Total time spent: 30 Minutes  Time spent includes review of chart, medications, test results, and follow up plan with the patient.      Dr Lyndon Code Internal medicine

## 2020-05-31 ENCOUNTER — Other Ambulatory Visit: Payer: Self-pay

## 2020-05-31 DIAGNOSIS — K219 Gastro-esophageal reflux disease without esophagitis: Secondary | ICD-10-CM

## 2020-05-31 MED ORDER — PANTOPRAZOLE SODIUM 40 MG PO TBEC: 40.0000 mg | DELAYED_RELEASE_TABLET | Freq: Every day | ORAL | 3 refills | Status: DC

## 2020-06-23 ENCOUNTER — Other Ambulatory Visit: Payer: Self-pay

## 2020-06-23 ENCOUNTER — Other Ambulatory Visit
Admission: RE | Admit: 2020-06-23 | Discharge: 2020-06-23 | Disposition: A | Discharge status: Home / Self Care | Payer: BC Managed Care – PPO | Source: Ambulatory Visit | Attending: Internal Medicine | Admitting: Internal Medicine

## 2020-06-23 ENCOUNTER — Encounter: Payer: Self-pay | Admitting: Internal Medicine

## 2020-06-23 ENCOUNTER — Ambulatory Visit: Payer: BC Managed Care – PPO | Admitting: Internal Medicine

## 2020-06-23 VITALS — BP 156/90 | HR 71 | Ht 74.0 in | Wt 218.0 lb

## 2020-06-23 DIAGNOSIS — E1169 Type 2 diabetes mellitus with other specified complication: Secondary | ICD-10-CM | POA: Insufficient documentation

## 2020-06-23 DIAGNOSIS — I447 Left bundle-branch block, unspecified: Secondary | ICD-10-CM

## 2020-06-23 DIAGNOSIS — E785 Hyperlipidemia, unspecified: Secondary | ICD-10-CM | POA: Insufficient documentation

## 2020-06-23 DIAGNOSIS — I2510 Atherosclerotic heart disease of native coronary artery without angina pectoris: Secondary | ICD-10-CM | POA: Insufficient documentation

## 2020-06-23 DIAGNOSIS — I440 Atrioventricular block, first degree: Secondary | ICD-10-CM

## 2020-06-23 DIAGNOSIS — I10 Essential (primary) hypertension: Secondary | ICD-10-CM | POA: Insufficient documentation

## 2020-06-23 DIAGNOSIS — R079 Chest pain, unspecified: Secondary | ICD-10-CM | POA: Diagnosis not present

## 2020-06-23 DIAGNOSIS — I2584 Coronary atherosclerosis due to calcified coronary lesion: Secondary | ICD-10-CM | POA: Diagnosis present

## 2020-06-23 DIAGNOSIS — R1013 Epigastric pain: Secondary | ICD-10-CM

## 2020-06-23 LAB — COMPREHENSIVE METABOLIC PANEL
ALT: 31 U/L (ref 0–44)
AST: 29 U/L (ref 15–41)
Albumin: 3.8 g/dL (ref 3.5–5.0)
Alkaline Phosphatase: 98 U/L (ref 38–126)
Anion gap: 9 (ref 5–15)
BUN: 14 mg/dL (ref 8–23)
CO2: 29 mmol/L (ref 22–32)
Calcium: 9.4 mg/dL (ref 8.9–10.3)
Chloride: 103 mmol/L (ref 98–111)
Creatinine, Ser: 1.32 mg/dL — ABNORMAL HIGH (ref 0.61–1.24)
GFR, Estimated: 59 mL/min — ABNORMAL LOW (ref >60)
Glucose, Bld: 143 mg/dL — ABNORMAL HIGH (ref 70–99)
Potassium: 3.7 mmol/L (ref 3.5–5.1)
Sodium: 141 mmol/L (ref 135–145)
Total Bilirubin: 0.8 mg/dL (ref 0.3–1.2)
Total Protein: 7.3 g/dL (ref 6.5–8.1)

## 2020-06-23 LAB — CBC WITH DIFFERENTIAL/PLATELET
Abs Immature Granulocytes: 0.01 10*3/uL (ref 0.00–0.07)
Basophils Absolute: 0.1 10*3/uL (ref 0.0–0.1)
Basophils Relative: 1 %
Eosinophils Absolute: 0.3 10*3/uL (ref 0.0–0.5)
Eosinophils Relative: 6 %
HCT: 40.6 % (ref 39.0–52.0)
Hemoglobin: 13.2 g/dL (ref 13.0–17.0)
Immature Granulocytes: 0 %
Lymphocytes Relative: 37 %
Lymphs Abs: 1.6 10*3/uL (ref 0.7–4.0)
MCH: 28.5 pg (ref 26.0–34.0)
MCHC: 32.5 g/dL (ref 30.0–36.0)
MCV: 87.7 fL (ref 80.0–100.0)
Monocytes Absolute: 0.4 10*3/uL (ref 0.1–1.0)
Monocytes Relative: 9 %
Neutro Abs: 2.1 10*3/uL (ref 1.7–7.7)
Neutrophils Relative %: 47 %
Platelets: 211 10*3/uL (ref 150–400)
RBC: 4.63 MIL/uL (ref 4.22–5.81)
RDW: 13.2 % (ref 11.5–15.5)
WBC: 4.4 10*3/uL (ref 4.0–10.5)
nRBC: 0 % (ref 0.0–0.2)

## 2020-06-23 LAB — LIPASE, BLOOD: Lipase: 34 U/L (ref 11–51)

## 2020-06-23 NOTE — Progress Notes (Signed)
Follow-up Outpatient Visit Date: 06/23/2020  Primary Care Provider: Carlean Jews, NP [address] [place] [address]  Chief Complaint: Follow-up chest pain and hypertension  HPI:  Gabriel Burton is a 69 y.o. male with history of  hypertension, hypertipidemia, LBBB and diabetes mellitus, who presents for follow-up of chest pain.  He was last seen in our office in 03/2020 by Eula Listen, PA, at which time he was doing well without further episodes of chest pain.  Isosorbide mononitrate was added for improved blood pressure control.  Today, Gabriel Burton reports that he is only been taking the isosorbide mononitrate about 3 days a week due to headaches.  He denies chest pain, shortness of breath, palpitations, and lightheadedness.  He notes some intermittent epigastric pain and wonders if this may be related to a ventral hernia.  He also developed cold symptoms several weeks ago and has been taking over-the-counter cold/flu medications.  Symptoms have mostly resolved, though he still has some GI upset at times.  --------------------------------------------------------------------------------------------------  Past Medical History:  Diagnosis Date  . Diabetes mellitus without complication (HCC)   . Hypertension    History reviewed. No pertinent surgical history.   Current Meds  Medication Sig  . amLODipine (NORVASC) 10 MG tablet Take 1 tablet (10 mg total) by mouth daily.  Marland Kitchen aspirin EC 81 MG tablet Take 81 mg by mouth daily.   Marland Kitchen atorvastatin (LIPITOR) 10 MG tablet Take 1 tablet (10 mg total) by mouth at bedtime.  . carvedilol (COREG) 12.5 MG tablet Take 1 tablet (12.5 mg total) by mouth 2 (two) times daily with a meal.  . Cholecalciferol (VITAMIN D3) 5000 units TABS Take 5,000 Units by mouth daily.   Marland Kitchen CINNAMON PO Take by mouth daily.   . fluticasone (FLONASE) 50 MCG/ACT nasal spray Place 2 sprays into both nostrils daily.  Marland Kitchen gabapentin (NEURONTIN) 100 MG capsule Take 1 capsule (100 mg  total) by mouth daily.  Marland Kitchen glucose blood test strip Check blood sugars TID  . Insulin Pen Needle (BD PEN NEEDLE NANO U/F) 32G X 4 MM MISC USE AS DIRECTED  TWICE  A DAILY DIAG E11.65  . isosorbide mononitrate (IMDUR) 30 MG 24 hr tablet Take 1 tablet (30 mg total) by mouth daily.  Marland Kitchen liraglutide (VICTOZA) 18 MG/3ML SOPN Inject 1.8 mg into the skin daily.  Marland Kitchen lisinopril-hydrochlorothiazide (ZESTORETIC) 20-12.5 MG tablet Take 2 tab po once daily for hypertension  . pantoprazole (PROTONIX) 40 MG tablet Take 1 tablet (40 mg total) by mouth daily.  . tadalafil (CIALIS) 10 MG tablet Take 1 tablet (10 mg total) by mouth daily as needed for erectile dysfunction.  . tamsulosin (FLOMAX) 0.4 MG CAPS capsule TAKE 1 CAPSULE BY MOUTH ONE-HALF HOUR AFTER THE SAME MEAL EACH DAY  . TOUJEO SOLOSTAR 300 UNIT/ML Solostar Pen Inject 40 units [place] daily. May increase up to 45 units as indicated.  . vitamin C (ASCORBIC ACID) 500 MG tablet Take 500 mg by mouth daily.     Allergies: Patient has no known allergies.  Social History   Tobacco Use  . Smoking status: Former Smoker    Packs/day: 0.40    Years: 8.00    Pack years: 3.20    Types: Cigarettes    Quit date: 2000    Years since quitting: 21.9  . Smokeless tobacco: Never Used  Vaping Use  . Vaping Use: Never used  Substance Use Topics  . Alcohol use: Never  . Drug use: Never  Family History  Problem Relation Age of Onset  . Hypertension Mother   . Hypertension Father   . Heart attack Brother 42    Review of Systems: A 12-system review of systems was performed and was negative except as noted in the HPI.  --------------------------------------------------------------------------------------------------  Physical Exam: BP (!) 156/90 (BP Location: Left Arm, Patient Position: Sitting, Cuff Size: Normal)   Pulse 71   Ht 6\' 2"  (1.88 m)   Wt 218 lb (98.9 kg)   SpO2 97%   BMI 27.99 kg/m   General: NAD. Neck: No JVD or HJR. Lungs: Clear to  auscultation bilateral without wheezes or crackles. Heart: Regular rate and rhythm without murmurs, rubs, or gallops. Abdomen: Soft, nontender, nondistended.  Upper abdominal ventral hernia versus rectus diastases noted. Extremities: No lower extremity edema.  EKG: Normal sinus rhythm with first-degree AV block (PR interval 218 ms), PACs, and left bundle branch block.  Compared with prior tracing from 04/02/2020, PR interval has decreased.  PAC is now present  Lab Results  Component Value Date   WBC 4.7 10/24/2019   HGB 14.2 10/24/2019   HCT 43.0 10/24/2019   MCV 88 10/24/2019   PLT 206 10/24/2019    Lab Results  Component Value Date   NA 142 10/24/2019   K 3.8 10/24/2019   CL 101 10/24/2019   CO2 26 10/24/2019   BUN 13 10/24/2019   CREATININE 1.37 (H) 10/24/2019   GLUCOSE 89 10/24/2019   ALT 38 10/24/2019    Lab Results  Component Value Date   CHOL 148 10/24/2019   HDL 66 10/24/2019   LDLCALC 67 10/24/2019   TRIG 76 10/24/2019    --------------------------------------------------------------------------------------------------  ASSESSMENT AND PLAN: Resistant hypertension: Blood pressure is mildly elevated again today despite being on 5 antihypertensive agents (isosorbide mononitrate use has been inconsistent, however).  Recent cold and ongoing OTC cold medication use may be elevating blood pressure slightly, though it sounds like Gabriel Burton blood pressure is never consistently below 130/80.  I have recommended that we proceed with secondary hypertension work-up including serum aldosterone:plasma renin activity ratio as well as renal artery Doppler.  I will discontinue isosorbide mononitrate, given inconsistent use.  I favor adding spironolactone as a next agent; as such we will also check a CMP today.  No other medication changes at this time.  Chest and epigastric pain: No significant chest pain noted since prior visit.  Myocardial perfusion stress test last year was  without ischemia.  Epigastric pain most likely GI in nature, including possibly related to a ventral hernia.  I encouraged Gabriel Burton to speak with his PCP should symptoms continue and to seek immediate medical attention if he has persistent abdominal pain or nausea/vomiting, as this could be a sign of incarcerated hernia and bowel obstruction.  In the setting of epigastric pain, I will check a CMP, CBC, and lipase today.  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  Given difficult to manage blood pressure and improved first-degree AV block, we will continue with carvedilol 12.5 mg twice daily.  Gabriel Burton does not have any symptoms to suggest transient high-grade AV block.  Hyperlipidemia: Lipids well controlled.  Continue statin therapy in the setting of diabetes mellitus.  Follow-up: Return to clinic in 2 to 3 months.  Montez Morita, MD 06/23/2020 10:07 AM

## 2020-06-23 NOTE — Patient Instructions (Signed)
Medication Instructions:  Your physician has recommended you make the following change in your medication:  1- STOP Isosorbide.  *If you need a refill on your cardiac medications before your next appointment, please call your pharmacy*   Lab Work: Your physician recommends that you return for lab work in: TODAY for CMP, CBC, LIPASE, SERUM ALDOSTERONE, PLASMA RENIN ACTIVITY RATIO. - Please go to the [facility]. You will check in at the front desk to the right as you walk into the atrium. Valet Parking is offered if needed. - No appointment needed. You may go any day between 7 am and 6 pm.  If you have labs (blood work) drawn today and your tests are completely normal, you will receive your results only by: Marland Kitchen MyChart Message (if you have MyChart) OR . A paper copy in the mail If you have any lab test that is abnormal or we need to change your treatment, we will call you to review the results.   Testing/Procedures: Your physician has requested that you have a renal artery duplex. During this test, an ultrasound is used to evaluate blood flow to the kidneys. Allow one hour for this exam. Do not eat after midnight the day before and avoid carbonated beverages. Take your medications as you usually do.  No food after 11PM the night before.  Water is OK. (Don't drink liquids if you have been instructed not to for ANOTHER test).  Take two Extra-Strength Gas-X capsules at bedtime the night before test.   Take an additional two Extra-Strength Gas-X capsules three (3) hours before the test or first thing in the morning.    Avoid foods that produce bowel gas, for 24 hours prior to exam (see below).    No breakfast, no chewing gum, no smoking or carbonated beverages.  Patient may take morning medications with water.  Come in for test at least 15 minutes early to register.    Follow-Up: At [facility], you and your health needs are our priority.  As part of our continuing mission to  provide you with exceptional heart care, we have created designated Provider Care Teams.  These Care Teams include your primary Cardiologist (physician) and Advanced Practice Providers (APPs -  Physician Assistants and Nurse Practitioners) who all work together to provide you with the care you need, when you need it.  We recommend signing up for the patient portal called "MyChart".  Sign up information is provided on this After Visit Summary.  MyChart is used to connect with patients for Virtual Visits (Telemedicine).  Patients are able to view lab/test results, encounter notes, upcoming appointments, etc.  Non-urgent messages can be sent to your provider as well.   To learn more about what you can do with MyChart, go to [email or web address].    Your next appointment:   2-3 month(s)  The format for your next appointment:   In Person  Provider:   You may see Yvonne Kendall, MD or one of the following Advanced Practice Providers on your designated Care Team:    Nicolasa Ducking, NP  Eula Listen, PA-C  Marisue Ivan, PA-C  Cadence Foss, [place]  Gillian Shields, NP

## 2020-06-29 ENCOUNTER — Telehealth: Payer: Self-pay | Admitting: *Deleted

## 2020-06-29 DIAGNOSIS — Z79899 Other long term (current) drug therapy: Secondary | ICD-10-CM

## 2020-06-29 DIAGNOSIS — I10 Essential (primary) hypertension: Secondary | ICD-10-CM

## 2020-06-29 LAB — ALDOSTERONE + RENIN ACTIVITY W/ RATIO
ALDO / PRA Ratio: 24.1 (ref 0.0–30.0)
Aldosterone: 9.3 ng/dL (ref 0.0–30.0)
PRA LC/MS/MS: 0.386 ng/mL/hr (ref 0.167–5.380)

## 2020-06-29 MED ORDER — SPIRONOLACTONE 25 MG PO TABS: 25.0000 mg | ORAL_TABLET | Freq: Every day | ORAL | 3 refills | Status: DC

## 2020-06-29 NOTE — Telephone Encounter (Signed)
No answer. Left message to call back.   

## 2020-06-29 NOTE — Telephone Encounter (Signed)
Gabriel Burton, Gabriel Burton  06/24/2020 10:12 AM EST      Labs are stable without significant abnormality to explain intermittent abdominal pain. Serum aldosterone:plasma renin activity ratio is still pending. We will follow-up with him again once this result is available. Gabriel Burton should continue his current medications and follow-up as discussed at our recent office visit.

## 2020-06-29 NOTE — Telephone Encounter (Signed)
Patient calling back. He verbalized understanding of the results and plan of care. He is aware to start spironolactone 25 mg daily and get lab work at [facility] in about 1 week.

## 2020-06-29 NOTE — Telephone Encounter (Signed)
-----   Message from Yvonne Kendall, MD sent at 06/29/2020  6:51 AM EST ----- Serum aldosterone:plasma renin activity ratio is borderline elevated.  Given persistent hypertension, I recommend adding spironolactone 25 mg daily with BMP within one week of starting the medication.

## 2020-07-06 ENCOUNTER — Encounter: Payer: BC Managed Care – PPO | Admitting: Nurse Practitioner

## 2020-07-06 ENCOUNTER — Ambulatory Visit (INDEPENDENT_AMBULATORY_CARE_PROVIDER_SITE_OTHER): Payer: BC Managed Care – PPO | Admitting: Nurse Practitioner

## 2020-07-06 ENCOUNTER — Other Ambulatory Visit: Payer: Self-pay

## 2020-07-06 VITALS — BP 152/92 | HR 73 | Temp 98.0°F | Resp 16 | Ht 74.0 in | Wt 217.8 lb

## 2020-07-06 DIAGNOSIS — E1165 Type 2 diabetes mellitus with hyperglycemia: Secondary | ICD-10-CM | POA: Diagnosis not present

## 2020-07-06 DIAGNOSIS — N529 Male erectile dysfunction, unspecified: Secondary | ICD-10-CM

## 2020-07-06 DIAGNOSIS — I447 Left bundle-branch block, unspecified: Secondary | ICD-10-CM

## 2020-07-06 DIAGNOSIS — Z794 Long term (current) use of insulin: Secondary | ICD-10-CM | POA: Diagnosis not present

## 2020-07-06 DIAGNOSIS — Z0001 Encounter for general adult medical examination with abnormal findings: Secondary | ICD-10-CM | POA: Diagnosis not present

## 2020-07-06 DIAGNOSIS — I10 Essential (primary) hypertension: Secondary | ICD-10-CM | POA: Diagnosis not present

## 2020-07-06 DIAGNOSIS — R300 Dysuria: Secondary | ICD-10-CM

## 2020-07-06 DIAGNOSIS — E1140 Type 2 diabetes mellitus with diabetic neuropathy, unspecified: Secondary | ICD-10-CM

## 2020-07-06 DIAGNOSIS — I1A0 Resistant hypertension: Secondary | ICD-10-CM

## 2020-07-06 LAB — POCT GLYCOSYLATED HEMOGLOBIN (HGB A1C): Hemoglobin A1C: 7.6 % — AB (ref 4.0–5.6)

## 2020-07-06 MED ORDER — LISINOPRIL-HYDROCHLOROTHIAZIDE 20-12.5 MG PO TABS: ORAL_TABLET | ORAL | 1 refills | Status: DC

## 2020-07-06 MED ORDER — GABAPENTIN 100 MG PO CAPS: 100.0000 mg | ORAL_CAPSULE | Freq: Every day | ORAL | 1 refills | Status: DC

## 2020-07-06 MED ORDER — TADALAFIL 10 MG PO TABS: 10.0000 mg | ORAL_TABLET | Freq: Every day | ORAL | 2 refills | Status: DC | PRN

## 2020-07-06 NOTE — Progress Notes (Signed)
[facility] [address] [place], [place] [address]  Internal MEDICINE  Office Visit Note  Patient Name: Gabriel Burton  [id number]  [id number]  Date of Service: 07/06/2020   Pt is here for routine health maintenance examination  Chief Complaint  Patient presents with  . Diabetes  . Hypertension  . controlled substance form    Reviewed with PT     The patient is here for health maintenance exam.  -having resistance hypertension. Sees cardiology. Have been changing medications in order to get better control. Current medication has him feeling sluggish, tired, and a little dizzy. Started this medication about a week ago. Noted that prior medications had been causing some problems with renal functions. Is to have ultrasound on his kidneys in next week and then follow up after that.  improved blood sugar control. HgbAic is 7.6 today, down from 8.1 at recent check.     Current Medication: Outpatient Encounter Medications as of 07/06/2020  Medication Sig  . amLODipine (NORVASC) 10 MG tablet Take 1 tablet (10 mg total) by mouth daily.  Marland Kitchen aspirin EC 81 MG tablet Take 81 mg by mouth daily.   Marland Kitchen atorvastatin (LIPITOR) 10 MG tablet Take 1 tablet (10 mg total) by mouth at bedtime.  . carvedilol (COREG) 12.5 MG tablet Take 1 tablet (12.5 mg total) by mouth 2 (two) times daily with a meal.  . Cholecalciferol (VITAMIN D3) 5000 units TABS Take 5,000 Units by mouth daily.   Marland Kitchen CINNAMON PO Take by mouth daily.   . fluticasone (FLONASE) 50 MCG/ACT nasal spray Place 2 sprays into both nostrils daily.  Marland Kitchen glucose blood test strip Check blood sugars TID  . Insulin Pen Needle (BD PEN NEEDLE NANO U/F) 32G X 4 MM MISC USE AS DIRECTED  TWICE  A DAILY DIAG E11.65  . liraglutide (VICTOZA) 18 MG/3ML SOPN Inject 1.8 mg into the skin daily.  . pantoprazole (PROTONIX) 40 MG tablet Take 1 tablet (40 mg total) by mouth daily.  Marland Kitchen spironolactone (ALDACTONE) 25 MG tablet Take 1 tablet (25 mg total)  by mouth daily.  . tamsulosin (FLOMAX) 0.4 MG CAPS capsule TAKE 1 CAPSULE BY MOUTH ONE-HALF HOUR AFTER THE SAME MEAL EACH DAY  . TOUJEO SOLOSTAR 300 UNIT/ML Solostar Pen Inject 40 units [place] daily. May increase up to 45 units as indicated.  . vitamin C (ASCORBIC ACID) 500 MG tablet Take 500 mg by mouth daily.   . [DISCONTINUED] gabapentin (NEURONTIN) 100 MG capsule Take 1 capsule (100 mg total) by mouth daily.  . [DISCONTINUED] lisinopril-hydrochlorothiazide (ZESTORETIC) 20-12.5 MG tablet Take 2 tab po once daily for hypertension  . [DISCONTINUED] tadalafil (CIALIS) 10 MG tablet Take 1 tablet (10 mg total) by mouth daily as needed for erectile dysfunction.  . nitroGLYCERIN (NITROSTAT) 0.4 MG SL tablet Place 1 tablet (0.4 mg total) under the tongue every 5 (five) minutes as needed for chest pain.  . tadalafil (CIALIS) 10 MG tablet Take 1 tablet (10 mg total) by mouth daily as needed for erectile dysfunction.   No facility-administered encounter medications on file as of 07/06/2020.    Surgical History: No past surgical history on file.  Medical History: Past Medical History:  Diagnosis Date  . Diabetes mellitus without complication (HCC)   . Hypertension     Family History: Family History  Problem Relation Age of Onset  . Hypertension Mother   . Hypertension Father   . Heart attack Brother 42      Review of Systems  Constitutional: Positive for activity change and fatigue. Negative for chills and unexpected weight change.  HENT: Negative for congestion, postnasal drip, rhinorrhea, sneezing and sore throat.   Respiratory: Negative for cough, chest tightness, shortness of breath and wheezing.   Cardiovascular: Negative for chest pain and palpitations.       Blood pressure elevated. Being treated per cardiology. Medications being changed and monitored closely   Gastrointestinal: Negative for abdominal pain, constipation, diarrhea, nausea and vomiting.  Endocrine: Negative for cold  intolerance, heat intolerance, polydipsia and polyuria.       Improved blood sugars since last visit.   Genitourinary: Negative for dysuria, frequency and urgency.  Musculoskeletal: Negative for arthralgias, back pain, joint swelling and neck pain.  Skin: Negative for rash.  Allergic/Immunologic: Negative for environmental allergies.  Neurological: Negative for dizziness, tremors, numbness and headaches.  Hematological: Negative for adenopathy. Does not bruise/bleed easily.  Psychiatric/Behavioral: Negative for behavioral problems (Depression), sleep disturbance and suicidal ideas. The patient is not nervous/anxious.     Today's Vitals   07/06/20 0842  BP: (!) 152/92  Pulse: 73  Resp: 16  Temp: 98 F (36.7 C)  SpO2: 98%  Weight: 217 lb 12.8 oz (98.8 kg)  Height: 6\' 2"  (1.88 m)   Body mass index is 27.96 kg/m.  Physical Exam Vitals and nursing note reviewed.  Constitutional:      General: He is not in acute distress.    Appearance: Normal appearance. He is well-developed and well-nourished. He is not diaphoretic.  HENT:     Head: Normocephalic and atraumatic.     Nose: Nose normal.     Mouth/Throat:     Mouth: Oropharynx is clear and moist.     Pharynx: No oropharyngeal exudate.  Eyes:     Extraocular Movements: EOM normal.     Pupils: Pupils are equal, round, and reactive to light.  Neck:     Thyroid: No thyromegaly.     Vascular: No JVD.     Trachea: No tracheal deviation.  Cardiovascular:     Rate and Rhythm: Normal rate and regular rhythm.     Pulses:          Dorsalis pedis pulses are 1+ on the right side and 1+ on the left side.       Posterior tibial pulses are 1+ on the right side and 1+ on the left side.     Heart sounds: Murmur heard.  No friction rub. No gallop.   Pulmonary:     Effort: Pulmonary effort is normal. No respiratory distress.     Breath sounds: Normal breath sounds. No wheezing or rales.  Chest:     Chest wall: No tenderness.  Abdominal:      General: Bowel sounds are normal.     Palpations: Abdomen is soft.     Tenderness: There is no abdominal tenderness.     Hernia: A hernia is present.     Comments: Ventral hernia present with exertion.  Musculoskeletal:        General: Normal range of motion.     Cervical back: Normal range of motion and neck supple.     Right foot: Normal range of motion. No deformity or bunion.     Left foot: Normal range of motion. No deformity or bunion.  Feet:     Right foot:     Protective Sensation: 10 sites tested. 10 sites sensed.     Skin integrity: Skin integrity normal.     Toenail Condition:  Right toenails are normal.     Left foot:     Protective Sensation: 10 sites tested. 10 sites sensed.     Skin integrity: Skin integrity normal.     Toenail Condition: Left toenails are normal.  Lymphadenopathy:     Cervical: No cervical adenopathy.  Skin:    General: Skin is warm and dry.     Capillary Refill: Capillary refill takes 2 to 3 seconds.  Neurological:     General: No focal deficit present.     Mental Status: He is alert and oriented to person, place, and time.     Cranial Nerves: No cranial nerve deficit.  Psychiatric:        Mood and Affect: Mood and affect and mood normal.        Behavior: Behavior normal.        Thought Content: Thought content normal.        Judgment: Judgment normal.    Dep[facility] screen [facility] 07/06/2020 01/27/2020 10/28/2019 07/04/2019 06/05/2019  Decreased Interest 0 0 0 0 0  Down, Depressed, Hopeless 0 0 0 0 0  PHQ - 2 Score 0 0 0 0 0  Altered sleeping - - - 0 -  Tired, decreased energy - - - 0 -  Change in appetite - - - 0 -  Feeling bad or failure about yourself  - - - 0 -  Trouble concentrating - - - 0 -  Moving slowly or fidgety/restless - - - 0 -  Suicidal thoughts - - - 0 -  PHQ-9 Score - - - 0 -    Functional Status Survey: Is the patient deaf or have difficulty hearing?: No Does the patient have difficulty seeing, even when wearing  glasses/contacts?: No Does the patient have difficulty concentrating, remembering, or making decisions?: No Does the patient have difficulty walking or climbing stairs?: No Does the patient have difficulty dressing or bathing?: No Does the patient have difficulty doing errands alone such as visiting a doctor's office or shopping?: No  MMSE - Mini Mental State Exam 07/06/2020 07/04/2019  Orientation to time 5 5  Orientation to Place 5 5  Registration 3 3  Attention/ Calculation 5 5  Recall 3 3  Language- name 2 objects 2 2  Language- repeat 1 1  Language- follow 3 step command 3 3  Language- read & follow direction 1 1  Write a sentence 1 1  Copy design 1 1  Total score 30 30    Fall Risk  07/06/2020 01/27/2020 10/28/2019 07/04/2019 06/05/2019  Falls in the past year? 0 0 0 0 0  Number falls in past yr: - - - 0 -  Injury with Fall? - - - 0 -      LABS: Recent Results (from the past 2160 hour(s))  POCT HgB A1C     Status: Abnormal   Collection Time: 04/26/20  9:34 AM  Result Value Ref Range   Hemoglobin A1C 8.1 (A) 4.0 - 5.6 %   HbA1c POC (<> result, manual entry)     HbA1c, POC (prediabetic range)     HbA1c, POC (controlled diabetic range)    Lipase, blood     Status: None   Collection Time: 06/23/20 11:01 AM  Result Value Ref Range   Lipase 34 11 - 51 U/L    Comment: Performed at [facility], [address] [place], [place] [id number]  Aldosterone + renin activity w/ ratio     Status: None  Collection Time: 06/23/20 11:01 AM  Result Value Ref Range   PRA LC/MS/MS 0.386 0.167 - 5.380 ng/mL/hr    Comment: (NOTE) This test was developed and its performance characteristics determined by [facility]. It has not been cleared or approved by the Food and Drug Administration.    ALDO / PRA Ratio 24.1 0.0 - 30.0    Comment: (NOTE)                         Units:      ng/dL per ng/mL/hr Performed At: [name] [address] [place], [place]  [id number] Jolene Schimke MD FV:[id number]    Aldosterone 9.3 0.0 - 30.0 ng/dL    Comment: (NOTE) This test was developed and its performance characteristics determined by [facility]. It has not been cleared or approved by the Food and Drug Administration.   Comprehensive metabolic panel     Status: Abnormal   Collection Time: 06/23/20 11:01 AM  Result Value Ref Range   Sodium 141 135 - 145 mmol/L   Potassium 3.7 3.5 - 5.1 mmol/L   Chloride 103 98 - 111 mmol/L   CO2 29 22 - 32 mmol/L   Glucose, Bld 143 (H) 70 - 99 mg/dL    Comment: Glucose reference range applies only to samples taken after fasting for at least 8 hours.   BUN 14 8 - 23 mg/dL   Creatinine, Ser 6.38 (H) 0.61 - 1.24 mg/dL   Calcium 9.4 8.9 - 46.6 mg/dL   Total Protein 7.3 6.5 - 8.1 g/dL   Albumin 3.8 3.5 - 5.0 g/dL   AST 29 15 - 41 U/L   ALT 31 0 - 44 U/L   Alkaline Phosphatase 98 38 - 126 U/L   Total Bilirubin 0.8 0.3 - 1.2 mg/dL   GFR, Estimated 59 (L) >60 mL/min    Comment: (NOTE) Calculated using the CKD-EPI Creatinine Equation (2021)    Anion gap 9 5 - 15    Comment: Performed at [facility], [address] [place], [place] [address]  CBC with Differential/Platelet     Status: None   Collection Time: 06/23/20 11:01 AM  Result Value Ref Range   WBC 4.4 4.0 - 10.5 K/uL   RBC 4.63 4.22 - 5.81 MIL/uL   Hemoglobin 13.2 13.0 - 17.0 g/dL   HCT 70.1 77.9 - 39.0 %   MCV 87.7 80.0 - 100.0 fL   MCH 28.5 26.0 - 34.0 pg   MCHC 32.5 30.0 - 36.0 g/dL   RDW 30.0 92.3 - 30.0 %   Platelets 211 150 - 400 K/uL   nRBC 0.0 0.0 - 0.2 %   Neutrophils Relative % 47 %   Neutro Abs 2.1 1.7 - 7.7 K/uL   Lymphocytes Relative 37 %   Lymphs Abs 1.6 0.7 - 4.0 K/uL   Monocytes Relative 9 %   Monocytes Absolute 0.4 0.1 - 1.0 K/uL   Eosinophils Relative 6 %   Eosinophils Absolute 0.3 0.0 - 0.5 K/uL   Basophils Relative 1 %   Basophils Absolute 0.1 0.0 - 0.1 K/uL   Immature Granulocytes 0 %   Abs Immature Granulocytes  0.01 0.00 - 0.07 K/uL    Comment: Performed at [facility], [address] [place], [place] [address]  POCT HgB A1C     Status: Abnormal   Collection Time: 07/06/20  9:00 AM  Result Value Ref Range   Hemoglobin A1C 7.6 (A) 4.0 - 5.6 %  HbA1c POC (<> result, manual entry)     HbA1c, POC (prediabetic range)     HbA1c, POC (controlled diabetic range)      Assessment/Plan: 1. Encounter for general adult medical examination with abnormal findings Annual health maintenance exam today.   2. Type 2 diabetes mellitus with hyperglycemia, with long-term current use of insulin (HCC) - POCT HgB A1C 7.6 today. Continue diabetic medication as prescribed   3. Resistant hypertension bp improved during visit. meds being monitored and manipulated per cardiology. Patient to continue monitoring blood pressure at home.   4. LBBB (left bundle branch block) Continue regular visits with cardiology as scheduled.   5. Erectile dysfunction, unspecified erectile dysfunction type May take cialis  as needed. Patient understands not to take this medication if needing to take nitroglycerin.  - tadalafil (CIALIS) 10 MG tablet; Take 1 tablet (10 mg total) by mouth daily as needed for erectile dysfunction.  Dispense: 10 tablet; Refill: 2  6. Dysuria - UA/M w/rflx Culture, Routine  General Counseling: Reyhan verbalizes understanding of the findings of todays visit and agrees with plan of treatment. I have discussed any further diagnostic evaluation that may be needed or ordered today. We also reviewed his medications today. he has been encouraged to call the office with any questions or concerns that should arise related to todays visit.    Counseling:  Cardiac risk factor modification:  1. Control blood pressure. 2. Exercise as prescribed. 3. Follow low sodium, low fat diet. and low fat and low cholestrol diet. 4. Take ASA  once a day. 5. Restricted calories diet to lose weight.  This  patient was seen by Vincent Gros FNP Collaboration with Dr Lyndon Code as a part of collaborative care agreement  Orders Placed This Encounter  Procedures  . UA/M w/rflx Culture, Routine  . POCT HgB A1C    Meds ordered this encounter  Medications  . tadalafil (CIALIS) 10 MG tablet    Sig: Take 1 tablet (10 mg total) by mouth daily as needed for erectile dysfunction.    Dispense:  10 tablet    Refill:  2    Order Specific Question:   Supervising Provider    Answer:   Lyndon Code [[id number]]    Total time spent: 45 Minutes  Time spent includes review of chart, medications, test results, and follow up plan with the patient.     Lyndon Code, MD  Internal Medicine

## 2020-07-07 LAB — UA/M W/RFLX CULTURE, ROUTINE
Bilirubin, UA: NEGATIVE
Glucose, UA: NEGATIVE
Ketones, UA: NEGATIVE
Leukocytes,UA: NEGATIVE
Nitrite, UA: NEGATIVE
Protein,UA: NEGATIVE
RBC, UA: NEGATIVE
Specific Gravity, UA: 1.009 (ref 1.005–1.030)
Urobilinogen, Ur: 0.2 mg/dL (ref 0.2–1.0)
pH, UA: 7 (ref 5.0–7.5)

## 2020-07-07 LAB — MICROSCOPIC EXAMINATION
Bacteria, UA: NONE SEEN
Casts: NONE SEEN /lpf
Epithelial Cells (non renal): NONE SEEN /hpf (ref 0–10)
RBC, Urine: NONE SEEN /hpf (ref 0–2)
WBC, UA: NONE SEEN /hpf (ref 0–5)

## 2020-07-18 ENCOUNTER — Emergency Department: Payer: BC Managed Care – PPO

## 2020-07-18 ENCOUNTER — Emergency Department
Admission: EM | Admit: 2020-07-18 | Discharge: 2020-07-18 | Disposition: A | Discharge status: Home / Self Care | Payer: BC Managed Care – PPO | Attending: Student in an Organized Health Care Education/Training Program | Admitting: Student in an Organized Health Care Education/Training Program

## 2020-07-18 ENCOUNTER — Encounter: Payer: Self-pay | Admitting: Emergency Medicine

## 2020-07-18 ENCOUNTER — Other Ambulatory Visit: Payer: Self-pay

## 2020-07-18 DIAGNOSIS — I10 Essential (primary) hypertension: Secondary | ICD-10-CM | POA: Insufficient documentation

## 2020-07-18 DIAGNOSIS — Z794 Long term (current) use of insulin: Secondary | ICD-10-CM | POA: Diagnosis not present

## 2020-07-18 DIAGNOSIS — E1140 Type 2 diabetes mellitus with diabetic neuropathy, unspecified: Secondary | ICD-10-CM | POA: Insufficient documentation

## 2020-07-18 DIAGNOSIS — R432 Parageusia: Secondary | ICD-10-CM | POA: Diagnosis present

## 2020-07-18 DIAGNOSIS — Z87891 Personal history of nicotine dependence: Secondary | ICD-10-CM | POA: Diagnosis not present

## 2020-07-18 DIAGNOSIS — Z79899 Other long term (current) drug therapy: Secondary | ICD-10-CM | POA: Diagnosis not present

## 2020-07-18 DIAGNOSIS — Z7982 Long term (current) use of aspirin: Secondary | ICD-10-CM | POA: Diagnosis not present

## 2020-07-18 DIAGNOSIS — U071 COVID-19: Secondary | ICD-10-CM | POA: Diagnosis not present

## 2020-07-18 DIAGNOSIS — E1165 Type 2 diabetes mellitus with hyperglycemia: Secondary | ICD-10-CM | POA: Insufficient documentation

## 2020-07-18 MED ORDER — ACETAMINOPHEN 325 MG PO TABS
ORAL_TABLET | ORAL | Status: AC
  Filled 2020-07-18: qty 2

## 2020-07-18 MED ORDER — BENZONATATE 100 MG PO CAPS
200.0000 mg | ORAL_CAPSULE | Freq: Once | ORAL | Status: AC
  Administered 2020-07-18: 200 mg via ORAL
  Filled 2020-07-18: qty 2

## 2020-07-18 MED ORDER — KETOROLAC TROMETHAMINE 10 MG PO TABS: 10.0000 mg | ORAL_TABLET | Freq: Three times a day (TID) | ORAL | 0 refills | Status: DC

## 2020-07-18 MED ORDER — ACETAMINOPHEN 325 MG PO TABS
650.0000 mg | ORAL_TABLET | Freq: Once | ORAL | Status: AC | PRN
  Administered 2020-07-18: 650 mg via ORAL

## 2020-07-18 MED ORDER — BENZONATATE 100 MG PO CAPS: ORAL_CAPSULE | ORAL | 0 refills | Status: DC

## 2020-07-18 MED ORDER — KETOROLAC TROMETHAMINE 30 MG/ML IJ SOLN
30.0000 mg | Freq: Once | INTRAMUSCULAR | Status: AC
  Administered 2020-07-18: 30 mg via INTRAMUSCULAR
  Filled 2020-07-18: qty 1

## 2020-07-18 NOTE — ED Provider Notes (Addendum)
[facility] Emergency Department Provider Note ____________________________________________  Time seen: 1409  I have reviewed the triage vital signs and the nursing notes.  HISTORY  Chief Complaint  Covid Positive  HPI Gabriel Burton is a 70 y.o. male, with the below medical history, presents himself to the ED for evaluation of symptoms consistent with recent Covid infection.   Patient reports he was diagnosed on Tuesday, and has had worsening symptoms including loss of his taste sensation.  Patient concerned about a potential pneumonia at this time noting continued fatigue loss of appetite.  Denies any chest pain, shortness of breath, nausea, vomiting, or diarrhea.  He has been taking over-the-counter cough and cold medicines with limited benefit.  Past Medical History:  Diagnosis Date  . Diabetes mellitus without complication (HCC)   . Hypertension     Patient Active Problem List   Diagnosis Date Noted  . Hyperlipidemia associated with type 2 diabetes mellitus (HCC) 06/23/2020  . Acute non-recurrent frontal sinusitis 01/27/2020  . Allergic rhinitis due to pollen 01/27/2020  . LBBB (left bundle branch block) 12/18/2019  . First degree AV block 12/18/2019  . Gastroesophageal reflux disease without esophagitis 10/28/2019  . Irregular heart rhythm 10/28/2019  . Flu vaccine need 07/04/2019  . Encounter for hepatitis C screening test for low risk patient 07/04/2019  . Hyperlipidemia LDL goal <70 12/27/2018  . Abnormal stress test 10/08/2018  . Chronic left shoulder pain 10/08/2018  . Cough 09/22/2018  . Acute upper respiratory infection 09/22/2018  . Abnormal ECG 09/22/2018  . Chest pain 09/22/2018  . Encounter for general adult medical examination with abnormal findings 06/09/2018  . Dysuria 06/09/2018  . Uncontrolled type 2 diabetes mellitus with hyperglycemia (HCC) 03/04/2018  . Erectile dysfunction 03/04/2018  . Vasomotor rhinitis 01/24/2018  . Type  2 diabetes mellitus with hyperglycemia (HCC) 01/24/2018  . Type 2 diabetes mellitus with diabetic neuropathy, with long-term current use of insulin (HCC) 11/11/2017  . Sore throat 11/11/2017  . Acute non-recurrent pansinusitis 11/11/2017  . Resistant hypertension 11/11/2017    History reviewed. No pertinent surgical history.  Prior to Admission medications   Medication Sig Start Date End Date Taking? Authorizing Provider  benzonatate (TESSALON PERLES) 100 MG capsule Take 1-2 tabs TID prn cough 07/18/20  Yes Briceyda Abdullah, Charlesetta Ivory, PA-C  ketorolac (TORADOL) 10 MG tablet Take 1 tablet (10 mg total) by mouth every 8 (eight) hours. 07/18/20  Yes Zofia Peckinpaugh, Charlesetta Ivory, PA-C  amLODipine (NORVASC) 10 MG tablet Take 1 tablet (10 mg total) by mouth daily. 02/19/20   Carlean Jews, NP  aspirin EC 81 MG tablet Take 81 mg by mouth daily.     [provider]  atorvastatin (LIPITOR) 10 MG tablet Take 1 tablet (10 mg total) by mouth at bedtime. 03/23/20   Carlean Jews, NP  carvedilol (COREG) 12.5 MG tablet Take 1 tablet (12.5 mg total) by mouth 2 (two) times daily with a meal. 09/16/19   Dunn, Raymon Mutton, PA-C  Cholecalciferol (VITAMIN D3) 5000 units TABS Take 5,000 Units by mouth daily.     [provider]  CINNAMON PO Take by mouth daily.     [provider]  fluticasone (FLONASE) 50 MCG/ACT nasal spray Place 2 sprays into both nostrils daily. 03/13/19   Carlean Jews, NP  gabapentin (NEURONTIN) 100 MG capsule Take 1 capsule (100 mg total) by mouth daily. 07/06/20   Carlean Jews, NP  glucose blood test strip Check blood sugars TID  04/26/20   Ronnell Freshwater, NP  Insulin Pen Needle (BD PEN NEEDLE NANO U/F) 32G X 4 MM MISC USE AS DIRECTED  TWICE  A DAILY DIAG E11.65 12/11/19   Ronnell Freshwater, NP  liraglutide (VICTOZA) 18 MG/3ML SOPN Inject 1.8 mg into the skin daily. 04/26/20   Ronnell Freshwater, NP  lisinopril-hydrochlorothiazide (ZESTORETIC) 20-12.5 MG tablet Take 2  tab po once daily for hypertension 07/06/20   Ronnell Freshwater, NP  nitroGLYCERIN (NITROSTAT) 0.4 MG SL tablet Place 1 tablet (0.4 mg total) under the tongue every 5 (five) minutes as needed for chest pain. 12/27/18 03/27/19  End, Harrell Gave, MD  pantoprazole (PROTONIX) 40 MG tablet Take 1 tablet (40 mg total) by mouth daily. 05/31/20   Ronnell Freshwater, NP  spironolactone (ALDACTONE) 25 MG tablet Take 1 tablet (25 mg total) by mouth daily. 06/29/20 09/27/20  End, Harrell Gave, MD  tadalafil (CIALIS) 10 MG tablet Take 1 tablet (10 mg total) by mouth daily as needed for erectile dysfunction. 07/06/20   Ronnell Freshwater, NP  tamsulosin (FLOMAX) 0.4 MG CAPS capsule TAKE 1 CAPSULE BY MOUTH ONE-HALF HOUR AFTER THE SAME MEAL EACH DAY 08/14/18   Boscia, Heather E, NP  TOUJEO SOLOSTAR 300 UNIT/ML Solostar Pen Inject 40 units [place] daily. May increase up to 45 units as indicated. 04/26/20   Ronnell Freshwater, NP  vitamin C (ASCORBIC ACID) 500 MG tablet Take 500 mg by mouth daily.     [provider]    Allergies Patient has no known allergies.  Family History  Problem Relation Age of Onset  . Hypertension Mother   . Hypertension Father   . Heart attack Brother 58    Social History Social History   Tobacco Use  . Smoking status: Former Smoker    Packs/day: 0.40    Years: 8.00    Pack years: 3.20    Types: Cigarettes    Quit date: 2000    Years since quitting: 22.0  . Smokeless tobacco: Never Used  Vaping Use  . Vaping Use: Never used  Substance Use Topics  . Alcohol use: Never  . Drug use: Never    Review of Systems  Constitutional: Negative for fever.  Reports fatigue as above. Eyes: Negative for visual changes. ENT: Negative for sore throat. Cardiovascular: Negative for chest pain. Respiratory: Negative for shortness of breath.  Reports persistent cough. Gastrointestinal: Negative for abdominal pain, vomiting and diarrhea. Genitourinary: Negative for  dysuria. Musculoskeletal: Negative for back pain. Skin: Negative for rash. Neurological: Negative for headaches, focal weakness or numbness.  Reports loss of taste sensation. ____________________________________________  PHYSICAL EXAM:  VITAL SIGNS: ED Triage Vitals  Enc Vitals Group     BP 07/18/20 1247 (!) 159/84     Pulse Rate 07/18/20 1243 91     Resp 07/18/20 1243 16     Temp 07/18/20 1243 (!) 101.7 F (38.7 C)     Temp Source 07/18/20 1243 Oral     SpO2 07/18/20 1243 95 %     Weight 07/18/20 1245 212 lb (96.2 kg)     Height 07/18/20 1245 6\' 2"  (1.88 m)     Head Circumference --      Peak Flow --      Pain Score 07/18/20 1617 3     Pain Loc --      Pain Edu? --      Excl. in [place]? --     Constitutional: Alert and oriented. Well appearing and in  no distress. Head: Normocephalic and atraumatic. Eyes: Conjunctivae are normal. Normal extraocular movements Cardiovascular: Normal rate, regular rhythm. Normal distal pulses. Respiratory: Normal respiratory effort. No wheezes/rales/rhonchi. Gastrointestinal: Soft and nontender. No distention. Musculoskeletal: Nontender with normal range of motion in all extremities.  Neurologic:  Normal gait without ataxia. Normal speech and language. No gross focal neurologic deficits are appreciated. Skin:  Skin is warm, dry and intact. No rash noted. ____________________________________________   RADIOLOGY  CXR  Negative ____________________________________________  PROCEDURES  Tylenol 650 mg PO Tessalon Perle 200 mg PO Ketorolac 30 mg IM  Procedures ____________________________________________  INITIAL IMPRESSION / ASSESSMENT AND PLAN / ED COURSE  DDX: COVID pneumonia, CAP, bronchitis  Patient presented to the ED for evaluation of copious x-ray presented to the ED afebrile without tachycardia or hypoxia.  He has been stable throughout his course in the ED.  He is reassured by his negative chest x-ray at this time.  Patient was  treated in the ED with Tessalon Perles, ketorolac, and Tylenol.  He responded to antipyretic, is discharged with prescription for Tessalon Perles and ketorolac.  Follow-up with primary provider for ongoing symptom management.  Complications have been discussed.  HUXLEY SHURLEY was evaluated in Emergency Department on 07/18/2020 for the symptoms described in the history of present illness. He was evaluated in the context of the global COVID-19 pandemic, which necessitated consideration that the patient might be at risk for infection with the SARS-CoV-2 virus that causes COVID-19. Institutional protocols and algorithms that pertain to the evaluation of patients at risk for COVID-19 are in a state of rapid change based on information released by regulatory bodies including the CDC and federal and state organizations. These policies and algorithms were followed during the patient's care in the ED. ____________________________________________  FINAL CLINICAL IMPRESSION(S) / ED DIAGNOSES  Final diagnoses:  COVID-19      Lissa Hoard, PA-C 07/18/20 1841    Lissa Hoard, PA-C 07/18/20 1842    Gilles Chiquito, MD 07/18/20 2141

## 2020-07-18 NOTE — ED Notes (Signed)
Pt reports generalized body aches, chills, fever, nausea, x 5 days. Pt ambulatory and A&O x 4 on arrival. Pt appears to be in NAD at this time. VSS.

## 2020-07-18 NOTE — ED Triage Notes (Signed)
Pt to ED via POV, pt states that he was diagnosed with COVID on Tuesday. Pt states that his symptoms are getting worse, he has lost his taste. Pt concerned he is getting pneumonia. Pt is speaking in complete sentences at this time and does not appear to be in any distress

## 2020-07-18 NOTE — Discharge Instructions (Addendum)
Follow-up with your primary provider for ongoing symptoms.  Continue with over-the-counter cough and cold medicine regimen.  Continue to hydrate to prevent dehydration peer return to the ED for worsening chest pain or shortness of breath.

## 2020-07-29 ENCOUNTER — Ambulatory Visit: Payer: BC Managed Care – PPO | Admitting: Hospice and Palliative Medicine

## 2020-08-16 ENCOUNTER — Other Ambulatory Visit: Payer: Self-pay | Admitting: Internal Medicine

## 2020-08-16 NOTE — Telephone Encounter (Signed)
Rx request sent to pharmacy.  

## 2020-08-20 ENCOUNTER — Telehealth: Payer: Self-pay | Admitting: Internal Medicine

## 2020-08-20 NOTE — Telephone Encounter (Signed)
lmov to schedule.  Holding slot on 2-7 at 930

## 2020-08-20 NOTE — Telephone Encounter (Signed)
-----   Message from Andi Devon sent at 08/19/2020  5:07 PM EST ----- Regarding: renal U/S Patient scheduled to F/U with Dr. Okey Dupre on 08/25/2020 but has not had renal U/S scheduled yet. Can you please schedule this or tell me who schedules these? Thank you!

## 2020-08-23 NOTE — Telephone Encounter (Signed)
Attempted to schedule.  

## 2020-08-25 ENCOUNTER — Ambulatory Visit: Payer: BC Managed Care – PPO | Admitting: Internal Medicine

## 2020-09-09 ENCOUNTER — Ambulatory Visit (INDEPENDENT_AMBULATORY_CARE_PROVIDER_SITE_OTHER): Payer: BC Managed Care – PPO

## 2020-09-09 ENCOUNTER — Other Ambulatory Visit: Payer: Self-pay

## 2020-09-09 DIAGNOSIS — I10 Essential (primary) hypertension: Secondary | ICD-10-CM | POA: Diagnosis not present

## 2020-09-11 ENCOUNTER — Other Ambulatory Visit: Payer: Self-pay | Admitting: Nurse Practitioner

## 2020-09-11 DIAGNOSIS — Z794 Long term (current) use of insulin: Secondary | ICD-10-CM

## 2020-09-11 DIAGNOSIS — E1165 Type 2 diabetes mellitus with hyperglycemia: Secondary | ICD-10-CM

## 2020-09-22 ENCOUNTER — Other Ambulatory Visit: Payer: Self-pay | Admitting: Nurse Practitioner

## 2020-09-29 ENCOUNTER — Encounter: Payer: Self-pay | Admitting: Internal Medicine

## 2020-09-29 ENCOUNTER — Other Ambulatory Visit: Payer: Self-pay

## 2020-09-29 ENCOUNTER — Ambulatory Visit: Payer: BC Managed Care – PPO | Admitting: Internal Medicine

## 2020-09-29 VITALS — BP 140/70 | HR 80 | Ht 74.0 in | Wt 208.5 lb

## 2020-09-29 DIAGNOSIS — Z79899 Other long term (current) drug therapy: Secondary | ICD-10-CM | POA: Diagnosis not present

## 2020-09-29 DIAGNOSIS — I10 Essential (primary) hypertension: Secondary | ICD-10-CM | POA: Diagnosis not present

## 2020-09-29 DIAGNOSIS — R109 Unspecified abdominal pain: Secondary | ICD-10-CM

## 2020-09-29 DIAGNOSIS — E1169 Type 2 diabetes mellitus with other specified complication: Secondary | ICD-10-CM

## 2020-09-29 DIAGNOSIS — E785 Hyperlipidemia, unspecified: Secondary | ICD-10-CM

## 2020-09-29 MED ORDER — SPIRONOLACTONE 25 MG PO TABS: 25.0000 mg | ORAL_TABLET | Freq: Every day | ORAL | 1 refills | Status: DC

## 2020-09-29 NOTE — Progress Notes (Signed)
Follow-up Outpatient Visit Date: 09/29/2020  Primary Care Provider: Lyndon Code, MD [address] [place] [address]  Chief Complaint: Follow-up hypertension  HPI:  Gabriel Burton is a 70 y.o. male with history of hypertension, hypertipidemia, LBBB and diabetes mellitus, who presents for follow-up of resistant hypertension.  I last saw him in early December, at which time blood pressure was elevated despite being on 5 antihypertensive agents.  Secondary hypertension work-up was pursued.  Renal artery Doppler was negative for renal artery stenosis.  Serum aldosterone to plasma renin activity ratio was upper normal.  Spironolactone was added in mid December, though blood pressure measurements at other facilities in early January showed persistent elevation.  Today, Gabriel Burton does not recall even being started on losartan.  He says multiple times that his most recent medication was discontinued by Eula Listen, PA, following their last visit.  This was due to headaches.  It appears that he is referencing previously prescribed isosorbide mononitrate, which we stopped at our last visit.  Gabriel Burton feels relatively well.  He denies chest pain and shortness of breath.  He has chronic left ankle edema as well as rare palpitations lasting only a second or 2.  He has not had any palpitations.  Since contracting COVID-19 in January, he has continued to have some GI issues, stating that he now needs to have a bowel movement every time he urinates.  He plans to address this with his PCP at their upcoming visit next week.  --------------------------------------------------------------------------------------------------  Past Medical History:  Diagnosis Date  . Diabetes mellitus without complication (HCC)   . Hypertension    No past surgical history on file.  Current Meds  Medication Sig  . amLODipine (NORVASC) 10 MG tablet Take 1 tablet (10 mg total) by mouth daily.  Marland Kitchen aspirin EC 81 MG tablet Take  81 mg by mouth daily.   Marland Kitchen atorvastatin (LIPITOR) 10 MG tablet Take 1 tablet (10 mg total) by mouth at bedtime.  . benzonatate (TESSALON PERLES) 100 MG capsule Take 1-2 tabs TID prn cough  . carvedilol (COREG) 12.5 MG tablet Take 1 tablet (12.5 mg total) by mouth 2 (two) times daily with a meal.  . Cholecalciferol (VITAMIN D3) 5000 units TABS Take 5,000 Units by mouth daily.   Marland Kitchen CINNAMON PO Take by mouth daily.   . fluticasone (FLONASE) 50 MCG/ACT nasal spray Place 2 sprays into both nostrils daily.  Marland Kitchen gabapentin (NEURONTIN) 100 MG capsule Take 1 capsule (100 mg total) by mouth daily.  Marland Kitchen glucose blood test strip Check blood sugars TID  . Insulin Pen Needle (BD PEN NEEDLE NANO U/F) 32G X 4 MM MISC USE AS DIRECTED  TWICE  A DAILY DIAG E11.65  . ketorolac (TORADOL) 10 MG tablet Take 1 tablet (10 mg total) by mouth every 8 (eight) hours.  Marland Kitchen lisinopril-hydrochlorothiazide (ZESTORETIC) 20-12.5 MG tablet Take 2 tab po once daily for hypertension  . nitroGLYCERIN (NITROSTAT) 0.4 MG SL tablet DISSOLVE ONE TABLET UNDER THE TONGUE EVERY 5 MINUTES AS NEEDED FOR CHEST PAIN.  DO NOT EXCEED A TOTAL OF 3 DOSES IN 15 MINUTES  . pantoprazole (PROTONIX) 40 MG tablet Take 1 tablet (40 mg total) by mouth daily.  Marland Kitchen spironolactone (ALDACTONE) 25 MG tablet Take 1 tablet (25 mg total) by mouth daily.  . tadalafil (CIALIS) 10 MG tablet Take 1 tablet (10 mg total) by mouth daily as needed for erectile dysfunction.  . tamsulosin (FLOMAX) 0.4 MG CAPS capsule TAKE 1 CAPSULE BY  MOUTH ONE-HALF HOUR AFTER THE SAME MEAL EACH DAY  . TOUJEO SOLOSTAR 300 UNIT/ML Solostar Pen Inject 40 units [place] daily. May increase up to 45 units as indicated.  Marland Kitchen VICTOZA 18 MG/3ML SOPN INJECT 1.8MG  INTO THE SKIN DAILY AS DIRECTED  . vitamin C (ASCORBIC ACID) 500 MG tablet Take 500 mg by mouth daily.     Allergies: Patient has no known allergies.  Social History   Tobacco Use  . Smoking status: Former Smoker    Packs/day: 0.40    Years: 8.00     Pack years: 3.20    Types: Cigarettes    Quit date: 2000    Years since quitting: 22.2  . Smokeless tobacco: Never Used  Vaping Use  . Vaping Use: Never used  Substance Use Topics  . Alcohol use: Never  . Drug use: Never    Family History  Problem Relation Age of Onset  . Hypertension Mother   . Hypertension Father   . Heart attack Brother 42    Review of Systems: A 12-system review of systems was performed and was negative except as noted in the HPI.  --------------------------------------------------------------------------------------------------  Physical Exam: BP 140/70 (BP Location: Left Arm, Patient Position: Sitting, Cuff Size: Normal)   Pulse 80   Ht 6\' 2"  (1.88 m)   Wt 208 lb 8 oz (94.6 kg)   SpO2 98%   BMI 26.77 kg/m   General:  NAD. Neck: No JVD or HJR. Lungs: Clear to auscultation bilaterally without wheezes or crackles. Heart: Regular rate and rhythm without murmurs, rubs, or gallops. Abdomen: Soft, nontender, nondistended. Extremities: Trace pretibial edema.  Lab Results  Component Value Date   WBC 4.4 06/23/2020   HGB 13.2 06/23/2020   HCT 40.6 06/23/2020   MCV 87.7 06/23/2020   PLT 211 06/23/2020    Lab Results  Component Value Date   NA 141 06/23/2020   K 3.7 06/23/2020   CL 103 06/23/2020   CO2 29 06/23/2020   BUN 14 06/23/2020   CREATININE 1.32 (H) 06/23/2020   GLUCOSE 143 (H) 06/23/2020   ALT 31 06/23/2020    Lab Results  Component Value Date   CHOL 148 10/24/2019   HDL 66 10/24/2019   LDLCALC 67 10/24/2019   TRIG 76 10/24/2019    --------------------------------------------------------------------------------------------------  ASSESSMENT AND PLAN: Uncontrolled hypertension: Blood pressure remains elevated today (goal less than 130/70), which has been longstanding.  We have previously discussed adding spironolactone in the setting of upper normal serum aldosterone:plasma renin activity ratio but it appears that Gabriel Burton  never actually started the medication.  We will check a BMP today to ensure stable renal function and electrolytes with plans to start spironolactone 25 mg daily.  We will repeat a BMP in 1 week to ensure stable creatinine and potassium.  We will defer changing his other antihypertensive medications including amlodipine, carvedilol, and lisinopril-HCTZ.  If his blood pressure remains uncontrolled down the road, sleep study to exclude underlying OSA will need to be considered.  Hyperlipidemia associated with type 2 diabetes mellitus: LDL well controlled on last check.  Continue atorvastatin 10 mg daily in the setting of diabetes mellitus.  Abdominal discomfort: Patient has lingering abdominal symptoms including frequent bowel movements and vague discomfort since contracting COVID-19 in January.  I will defer further work-up to his PCP.  Follow-up: Return to clinic in 3 months.  February, MD 09/29/2020 9:02 AM

## 2020-09-29 NOTE — Patient Instructions (Signed)
Medication Instructions:  Your physician has recommended you make the following change in your medication:  1- START Spironolactone 25 mg by mouth once a day.  *If you need a refill on your cardiac medications before your next appointment, please call your pharmacy*  Lab Work: Your physician recommends that you return for lab work in: TODAY - BMET.  Your physician recommends that you return for lab work in: 1 WEEK ON October 06, 2020 FOR BMET. - Please go to the [facility]. You will check in at the front desk to the right as you walk into the atrium. Valet Parking is offered if needed. - No appointment needed. You may go any day between 7 am and 6 pm.  If you have labs (blood work) drawn today and your tests are completely normal, you will receive your results only by: Marland Kitchen MyChart Message (if you have MyChart) OR . A paper copy in the mail If you have any lab test that is abnormal or we need to change your treatment, we will call you to review the results.  Testing/Procedures: none  Follow-Up: At [facility], you and your health needs are our priority.  As part of our continuing mission to provide you with exceptional heart care, we have created designated Provider Care Teams.  These Care Teams include your primary Cardiologist (physician) and Advanced Practice Providers (APPs -  Physician Assistants and Nurse Practitioners) who all work together to provide you with the care you need, when you need it.  We recommend signing up for the patient portal called "MyChart".  Sign up information is provided on this After Visit Summary.  MyChart is used to connect with patients for Virtual Visits (Telemedicine).  Patients are able to view lab/test results, encounter notes, upcoming appointments, etc.  Non-urgent messages can be sent to your provider as well.   To learn more about what you can do with MyChart, go to [email or web address].    Your next appointment:   3 month(s)  The format  for your next appointment:   In Person  Provider:   You may see DR Cristal Deer END or Eula Listen, PA-C or one of the following Advanced Practice Providers on your designated Care Team:

## 2020-09-30 LAB — BASIC METABOLIC PANEL
BUN/Creatinine Ratio: 10 (ref 10–24)
BUN: 14 mg/dL (ref 8–27)
CO2: 25 mmol/L (ref 20–29)
Calcium: 9.8 mg/dL (ref 8.6–10.2)
Chloride: 102 mmol/L (ref 96–106)
Creatinine, Ser: 1.34 mg/dL — ABNORMAL HIGH (ref 0.76–1.27)
Glucose: 108 mg/dL — ABNORMAL HIGH (ref 65–99)
Potassium: 4.1 mmol/L (ref 3.5–5.2)
Sodium: 143 mmol/L (ref 134–144)
eGFR: 58 mL/min/{1.73_m2} — ABNORMAL LOW (ref >59)

## 2020-10-01 ENCOUNTER — Telehealth: Payer: Self-pay | Admitting: *Deleted

## 2020-10-01 NOTE — Telephone Encounter (Signed)
-----   Message from Yvonne Kendall, MD sent at 10/01/2020  9:20 AM EDT ----- Renal function and potassium stable.  Okay to start spironolactone as discussed at this week's office visit with repeat BMP in approximately 1 week.

## 2020-10-01 NOTE — Telephone Encounter (Signed)
Patient returning call.

## 2020-10-01 NOTE — Telephone Encounter (Signed)
No answer. Left message to call back.   

## 2020-10-05 ENCOUNTER — Ambulatory Visit: Payer: BC Managed Care – PPO | Admitting: Hospice and Palliative Medicine

## 2020-10-05 ENCOUNTER — Other Ambulatory Visit: Payer: Self-pay

## 2020-10-05 ENCOUNTER — Encounter: Payer: Self-pay | Admitting: Hospice and Palliative Medicine

## 2020-10-05 VITALS — BP 138/82 | HR 80 | Temp 97.5°F | Resp 16 | Ht 74.0 in | Wt 212.0 lb

## 2020-10-05 DIAGNOSIS — R140 Abdominal distension (gaseous): Secondary | ICD-10-CM

## 2020-10-05 DIAGNOSIS — I10 Essential (primary) hypertension: Secondary | ICD-10-CM | POA: Diagnosis not present

## 2020-10-05 DIAGNOSIS — E1165 Type 2 diabetes mellitus with hyperglycemia: Secondary | ICD-10-CM

## 2020-10-05 DIAGNOSIS — R159 Full incontinence of feces: Secondary | ICD-10-CM | POA: Diagnosis not present

## 2020-10-05 DIAGNOSIS — Z794 Long term (current) use of insulin: Secondary | ICD-10-CM | POA: Diagnosis not present

## 2020-10-05 DIAGNOSIS — I1A0 Resistant hypertension: Secondary | ICD-10-CM

## 2020-10-05 DIAGNOSIS — R152 Fecal urgency: Secondary | ICD-10-CM

## 2020-10-05 LAB — POCT GLYCOSYLATED HEMOGLOBIN (HGB A1C): Hemoglobin A1C: 7.5 % — AB (ref 4.0–5.6)

## 2020-10-05 MED ORDER — SPIRONOLACTONE 25 MG PO TABS: 25.0000 mg | ORAL_TABLET | Freq: Every day | ORAL | 1 refills | Status: DC

## 2020-10-05 NOTE — Telephone Encounter (Signed)
Patient calling back and verbalized understanding of the lab results. He states he went over to his pharmacy last week after his appointment on 09/29/20 and they did not have it. It shows on in Epic that receipt to pharmacy was confirmed on 09/29/20 at 9:11 am. Patient did not let us know and did not ever start it.  Advised I will resend it in now and for patient to pick it up as soon as he can. Then plan for repeat lab work next Tuesday or Wednesday. He verbalized understanding.

## 2020-10-05 NOTE — Progress Notes (Signed)
[facility] [address] [place], [place] [address]  Internal MEDICINE  Office Visit Note  Patient Name: Gabriel Burton  [id number]  [id number]  Date of Service: 10/06/2020  Chief Complaint  Patient presents with  . Follow-up  . Diabetes  . Hypertension  . Quality Metric Gaps    Eye exam scheduled    HPI Patient is here for routine follow-up Complaining of GI issues since having COVID in January Feels bloated and abdominal distention--worsens with eating, also noticed that sometimes when he has to urinate he feels the need or urge to have a bowel movement Symptoms have started since COVID infection Has intermittent issues with constipation--maybe once per week which have been going on prior to COVID and other GI symptoms Last colonoscopy in 2013 normal  DM-glucose levels at home have been averaging 150-160 Currently taking 42 units of Toujeo daily and 1.8 mg of Victoza  Followed by cardiology for resistant hypertension  Current Medication: Outpatient Encounter Medications as of 10/05/2020  Medication Sig  . amLODipine (NORVASC) 10 MG tablet Take 1 tablet (10 mg total) by mouth daily.  Marland Kitchen aspirin EC 81 MG tablet Take 81 mg by mouth daily.   Marland Kitchen atorvastatin (LIPITOR) 10 MG tablet Take 1 tablet (10 mg total) by mouth at bedtime.  . carvedilol (COREG) 12.5 MG tablet Take 1 tablet (12.5 mg total) by mouth 2 (two) times daily with a meal.  . Cholecalciferol (VITAMIN D3) 5000 units TABS Take 5,000 Units by mouth daily.   Marland Kitchen CINNAMON PO Take by mouth daily.   . fluticasone (FLONASE) 50 MCG/ACT nasal spray Place 2 sprays into both nostrils daily.  Marland Kitchen gabapentin (NEURONTIN) 100 MG capsule Take 1 capsule (100 mg total) by mouth daily.  Marland Kitchen glucose blood test strip Check blood sugars TID  . Insulin Pen Needle (BD PEN NEEDLE NANO U/F) 32G X 4 MM MISC USE AS DIRECTED  TWICE  A DAILY DIAG E11.65  . ketorolac (TORADOL) 10 MG tablet Take 1 tablet (10 mg total) by mouth every 8  (eight) hours.  Marland Kitchen lisinopril-hydrochlorothiazide (ZESTORETIC) 20-12.5 MG tablet Take 2 tab po once daily for hypertension  . nitroGLYCERIN (NITROSTAT) 0.4 MG SL tablet DISSOLVE ONE TABLET UNDER THE TONGUE EVERY 5 MINUTES AS NEEDED FOR CHEST PAIN.  DO NOT EXCEED A TOTAL OF 3 DOSES IN 15 MINUTES  . pantoprazole (PROTONIX) 40 MG tablet Take 1 tablet (40 mg total) by mouth daily.  . tadalafil (CIALIS) 10 MG tablet Take 1 tablet (10 mg total) by mouth daily as needed for erectile dysfunction.  . tamsulosin (FLOMAX) 0.4 MG CAPS capsule TAKE 1 CAPSULE BY MOUTH ONE-HALF HOUR AFTER THE SAME MEAL EACH DAY  . TOUJEO SOLOSTAR 300 UNIT/ML Solostar Pen Inject 40 units [place] daily. May increase up to 45 units as indicated.  Marland Kitchen VICTOZA 18 MG/3ML SOPN INJECT 1.8MG  INTO THE SKIN DAILY AS DIRECTED  . vitamin C (ASCORBIC ACID) 500 MG tablet Take 500 mg by mouth daily.   . [DISCONTINUED] benzonatate (TESSALON PERLES) 100 MG capsule Take 1-2 tabs TID prn cough  . [DISCONTINUED] spironolactone (ALDACTONE) 25 MG tablet Take 1 tablet (25 mg total) by mouth daily.   No facility-administered encounter medications on file as of 10/05/2020.    Surgical History: History reviewed. No pertinent surgical history.  Medical History: Past Medical History:  Diagnosis Date  . Diabetes mellitus without complication (HCC)   . Hypertension     Family History: Family History  Problem Relation Age of  Onset  . Hypertension Mother   . Hypertension Father   . Heart attack Brother 52    Social History   Socioeconomic History  . Marital status: Widowed    Spouse name: Not on file  . Number of children: Not on file  . Years of education: Not on file  . Highest education level: Not on file  Occupational History  . Not on file  Tobacco Use  . Smoking status: Former Smoker    Packs/day: 0.40    Years: 8.00    Pack years: 3.20    Types: Cigarettes    Quit date: 2000    Years since quitting: 22.2  . Smokeless tobacco: Never  Used  Vaping Use  . Vaping Use: Never used  Substance and Sexual Activity  . Alcohol use: Never  . Drug use: Never  . Sexual activity: Not on file  Other Topics Concern  . Not on file  Social History Narrative  . Not on file   Social Determinants of Health   Financial Resource Strain: Not on file  Food Insecurity: Not on file  Transportation Needs: Not on file  Physical Activity: Not on file  Stress: Not on file  Social Connections: Not on file  Intimate Partner Violence: Not on file   Review of Systems  Constitutional: Negative for chills, fatigue and unexpected weight change.  HENT: Negative for congestion, postnasal drip, rhinorrhea, sneezing and sore throat.   Eyes: Negative for redness.  Respiratory: Negative for cough, chest tightness and shortness of breath.   Cardiovascular: Negative for chest pain and palpitations.  Gastrointestinal: Positive for abdominal distention and constipation. Negative for abdominal pain, diarrhea, nausea and vomiting.  Genitourinary: Negative for dysuria and frequency.  Musculoskeletal: Negative for arthralgias, back pain, joint swelling and neck pain.  Skin: Negative for rash.  Neurological: Negative for tremors and numbness.  Hematological: Negative for adenopathy. Does not bruise/bleed easily.  Psychiatric/Behavioral: Negative for behavioral problems (Depression), sleep disturbance and suicidal ideas. The patient is not nervous/anxious.     Vital Signs: BP 138/82   Pulse 80   Temp (!) 97.5 F (36.4 C)   Resp 16   Ht 6\' 2"  (1.88 m)   Wt 212 lb (96.2 kg)   SpO2 98%   BMI 27.22 kg/m    Physical Exam Vitals reviewed.  Constitutional:      Appearance: Normal appearance. He is normal weight.  Cardiovascular:     Rate and Rhythm: Normal rate and regular rhythm.     Pulses: Normal pulses.     Heart sounds: Normal heart sounds.  Pulmonary:     Effort: Pulmonary effort is normal.     Breath sounds: Normal breath sounds.   Abdominal:     General: Abdomen is flat.     Palpations: Abdomen is soft.  Musculoskeletal:        General: Normal range of motion.     Cervical back: Normal range of motion.  Skin:    General: Skin is warm.  Neurological:     General: No focal deficit present.     Mental Status: He is alert and oriented to person, place, and time. Mental status is at baseline.  Psychiatric:        Mood and Affect: Mood normal.        Behavior: Behavior normal.        Thought Content: Thought content normal.        Judgment: Judgment normal.    Assessment/Plan: 1. Type  2 diabetes mellitus with hyperglycemia, with long-term current use of insulin (HCC) A1C slightly improved at 7.5 today--discussed goal is <7 Increase Toujeo to 45 units daily Encouraged to keep a log of glucose readings and bring to next visit for closer review - POCT HgB A1C  2. Abdominal distension (gaseous) Distention, constipation, pain, increased flatulence and fecal urgency since COVID infection in January Will review CT abdomen for underlying cause - CT Abdomen Pelvis Wo Contrast; Future  3. Incontinence of feces with fecal urgency Will review CT abdomen for possible underlying cause  4. Resistant hypertension Followed by cardiology, BP and HR controlled today, consider PSG for underlying OSA--discuss at next visit   General Counseling: Tandy verbalizes understanding of the findings of todays visit and agrees with plan of treatment. I have discussed any further diagnostic evaluation that may be needed or ordered today. We also reviewed his medications today. he has been encouraged to call the office with any questions or concerns that should arise related to todays visit.    Orders Placed This Encounter  Procedures  . CT Abdomen Pelvis Wo Contrast  . POCT HgB A1C    Time spent: 30 Minutes Time spent includes review of chart, medications, test results and follow-up plan with the patient.  This patient was seen  by Leeanne Deed AGNP-C in Collaboration with Dr Lyndon Code as a part of collaborative care agreement     Lubertha Basque. Harris AGNP-C Internal medicine

## 2020-10-05 NOTE — Addendum Note (Signed)
Addended by: Jeannetta Nap on: 10/05/2020 12:59 PM   Modules accepted: Orders

## 2020-10-06 ENCOUNTER — Encounter: Payer: Self-pay | Admitting: Hospice and Palliative Medicine

## 2020-10-19 ENCOUNTER — Other Ambulatory Visit: Payer: Self-pay | Admitting: Internal Medicine

## 2020-10-19 DIAGNOSIS — Z794 Long term (current) use of insulin: Secondary | ICD-10-CM

## 2020-10-19 DIAGNOSIS — E1165 Type 2 diabetes mellitus with hyperglycemia: Secondary | ICD-10-CM

## 2020-11-19 ENCOUNTER — Other Ambulatory Visit: Payer: Self-pay | Admitting: Internal Medicine

## 2020-11-19 DIAGNOSIS — E1165 Type 2 diabetes mellitus with hyperglycemia: Secondary | ICD-10-CM

## 2020-11-19 DIAGNOSIS — Z794 Long term (current) use of insulin: Secondary | ICD-10-CM

## 2020-12-20 ENCOUNTER — Other Ambulatory Visit: Payer: Self-pay | Admitting: Physician Assistant

## 2020-12-20 ENCOUNTER — Other Ambulatory Visit: Payer: Self-pay | Admitting: Internal Medicine

## 2020-12-20 DIAGNOSIS — E1165 Type 2 diabetes mellitus with hyperglycemia: Secondary | ICD-10-CM

## 2020-12-20 DIAGNOSIS — Z794 Long term (current) use of insulin: Secondary | ICD-10-CM

## 2020-12-28 NOTE — Progress Notes (Deleted)
Cardiology Office Note    Date:  12/28/2020   ID:  Gabriel Burton, DOB 08/11/1950, MRN [id number]  PCP:  Lyndon Code, MD  Cardiologist:  Yvonne Kendall, MD  Electrophysiologist:  None   Chief Complaint: Follow up  History of Present Illness:   Gabriel Burton is a 70 y.o. male with history of atypical chest pain, CKD stage II, DM 2, HTN, HLD, COVID in 07/2020, and LBBB who presents for follow-up of HTN.  He was initially evaluated by Dr. Okey Dupre in 10/2018 for chest pain with preceding ETT being nondiagnostic secondary to baseline LBBB.  Echo, performed by outside office in 09/2018 showed normal LVEF, normal LV size with mild LVH, diastolic dysfunction, trace mitral and mild tricuspid regurgitation, and normal RV systolic function and ventricular cavity size.  At his establishment with cardiology, he preferred to defer Lexiscan MPI in favor of medical therapy.  However, he continued to note occasional vague chest discomfort and in this setting he underwent Lexiscan MPI in 12/2018 which showed no significant ischemia, fixed defect of the septal wall, apical region, with hypokinesis of the septal wall with an EF estimated at 43% with perfusion defect and depressed EF possibly secondary to underlying LBBB.  Very mild coronary artery calcification and aortic atherosclerosis was noted on CT imaging.  Overall, this was a low to moderate risk scan.    Despite escalation of antihypertensive therapy, and being on 5 agents, his BP remained elevated in the office.  In this setting he underwent renal artery Doppler in ***, which was negative for RAS.  Serum aldosterone to plasma renin activity ratio was upper normal.  It was recommended he start spironolactone, however it never did.  At his last visit in 09/2020 he was doing relatively well from a cardiac perspective.  He noted chronic left ankle edema as well as rare palpitations.  He noted continued GI issues following his COVID illness.  His blood pressure  remained elevated at 140/70.  It was again recommended he start spironolactone 25 mg.  Follow-up labs remain pending.  ***   Labs independently reviewed: 09/2020 - A1c 7.5, BUN 14, serum creatinine 1.34, potassium 4.1 07/2020 - Hgb 14.1, PLT 402, albumin 3.2, AST/ALT normal, magnesium 2.0 10/2019 - TSH normal, TC 148, TG 76, HDL 66, LDL 67  Past Medical History:  Diagnosis Date   Diabetes mellitus without complication (HCC)    Hypertension     No past surgical history on file.  Current Medications: No outpatient medications have been marked as taking for the 01/03/21 encounter (Appointment) with Sondra Barges, PA-C.    Allergies:   Patient has no known allergies.   Social History   Socioeconomic History   Marital status: Widowed    Spouse name: Not on file   Number of children: Not on file   Years of education: Not on file   Highest education level: Not on file  Occupational History   Not on file  Tobacco Use   Smoking status: Former    Packs/day: 0.40    Years: 8.00    Pack years: 3.20    Types: Cigarettes    Quit date: 2000    Years since quitting: 22.4   Smokeless tobacco: Never  Vaping Use   Vaping Use: Never used  Substance and Sexual Activity   Alcohol use: Never   Drug use: Never   Sexual activity: Not on file  Other Topics Concern   Not on file  Social  History Narrative   Not on file   Social Determinants of Health   Financial Resource Strain: Not on file  Food Insecurity: Not on file  Transportation Needs: Not on file  Physical Activity: Not on file  Stress: Not on file  Social Connections: Not on file     Family History:  The patient's family history includes Heart attack (age of onset: 92) in his brother; Hypertension in his father and mother.  ROS:   ROS   EKGs/Labs/Other Studies Reviewed:    Studies reviewed were summarized above. The additional studies were reviewed today: As above.   EKG:  EKG is ordered today.  The EKG ordered  today demonstrates ***  Recent Labs: 06/23/2020: ALT 31; Hemoglobin 13.2; Platelets 211 09/29/2020: BUN 14; Creatinine, Ser 1.34; Potassium 4.1; Sodium 143  Recent Lipid Panel    Component Value Date/Time   CHOL 148 10/24/2019 1514   TRIG 76 10/24/2019 1514   HDL 66 10/24/2019 1514   LDLCALC 67 10/24/2019 1514    PHYSICAL EXAM:    VS:  There were no vitals taken for this visit.  BMI: There is no height or weight on file to calculate BMI.  Physical Exam  Wt Readings from Last 3 Encounters:  10/05/20 212 lb (96.2 kg)  09/29/20 208 lb 8 oz (94.6 kg)  07/18/20 212 lb (96.2 kg)     ASSESSMENT & PLAN:   HTN: Blood pressure ***  HLD: LDL 67 in 10/2019.  DM2: A1c 7.5 in 09/2020.  Disposition: F/u with Dr. Okey Dupre or an APP in ***.   Medication Adjustments/Labs and Tests Ordered: Current medicines are reviewed at length with the patient today.  Concerns regarding medicines are outlined above. Medication changes, Labs and Tests ordered today are summarized above and listed in the Patient Instructions accessible in Encounters.   Signed, Eula Listen, PA-C 12/28/2020 2:46 PM     [facility] [address] [place], [place] [address] [phone]

## 2021-01-03 ENCOUNTER — Telehealth: Payer: Self-pay | Admitting: Internal Medicine

## 2021-01-03 ENCOUNTER — Ambulatory Visit: Payer: BC Managed Care – PPO | Admitting: Physician Assistant

## 2021-01-03 NOTE — Telephone Encounter (Signed)
Pt c/o of Chest Pain: STAT if CP now or developed within 24 hours  1. Are you having CP right now? No  feels like indigestion   2. Are you experiencing any other symptoms (ex. SOB, nausea, vomiting, sweating)? Left side chest / rotator cuff area and radiates into neck and back of head   3. How long have you been experiencing CP? 4-5 days   4. Is your CP continuous or coming and going? Comes and goes   5. Have you taken Nitroglycerin? Not today but has taken before and feels like it helps  ? Patient appt today cancelled due to provider changes .  Unable to schedule sooner than 7/20 .  Patient concerned about the wait time and he was prepared to discuss above at his ROV today.

## 2021-01-03 NOTE — Telephone Encounter (Signed)
Patient came to office for appointment and they offered appointment for tomorrow but declined. Attempted to call patient but no answer. Left voicemail messages to call back regarding his test results and appointment.

## 2021-01-03 NOTE — Telephone Encounter (Signed)
Left voicemail message to call back for review.  

## 2021-01-03 NOTE — Telephone Encounter (Signed)
Left voicemail message to call back for review of information.  

## 2021-01-03 NOTE — Telephone Encounter (Signed)
Left voicemail message that I have appointment available tomorrow and would like to see if this would work for him with instructions to call back.

## 2021-01-04 ENCOUNTER — Ambulatory Visit: Payer: BC Managed Care – PPO | Admitting: Nurse Practitioner

## 2021-01-04 ENCOUNTER — Other Ambulatory Visit: Payer: Self-pay

## 2021-01-04 ENCOUNTER — Encounter: Payer: Self-pay | Admitting: Nurse Practitioner

## 2021-01-04 VITALS — BP 149/86 | HR 74 | Temp 97.8°F | Resp 16 | Ht 74.0 in | Wt 210.2 lb

## 2021-01-04 DIAGNOSIS — Z794 Long term (current) use of insulin: Secondary | ICD-10-CM

## 2021-01-04 DIAGNOSIS — M542 Cervicalgia: Secondary | ICD-10-CM

## 2021-01-04 DIAGNOSIS — M25512 Pain in left shoulder: Secondary | ICD-10-CM | POA: Diagnosis not present

## 2021-01-04 DIAGNOSIS — E1165 Type 2 diabetes mellitus with hyperglycemia: Secondary | ICD-10-CM

## 2021-01-04 LAB — POCT GLYCOSYLATED HEMOGLOBIN (HGB A1C): Hemoglobin A1C: 7.1 % — AB (ref 4.0–5.6)

## 2021-01-04 MED ORDER — MELOXICAM 7.5 MG PO TABS: 7.5000 mg | ORAL_TABLET | Freq: Every day | ORAL | 0 refills | Status: DC

## 2021-01-04 MED ORDER — TRAMADOL HCL 50 MG PO TABS: 50.0000 mg | ORAL_TABLET | Freq: Three times a day (TID) | ORAL | 0 refills | Status: DC | PRN

## 2021-01-04 NOTE — Progress Notes (Signed)
[facility] [address] [place], [place] [address]  Internal MEDICINE  Office Visit Note  Patient Name: Gabriel Burton  [id number]  [id number]  Date of Service: 01/12/2021  Chief Complaint  Patient presents with   Diabetes    A1c. Pain in neck back side and shoulder its been going on for about 2 weeks has gotten worst over the last 24 hours    Follow-up    HPI Gabriel Burton presents for a follow up visit to check his A1C and discuss left neck and shoulder pain. He has a history of diabetes, hyperlipidemia and hypertension. His previous A1C was 7.5. His A1C was improved today at 7.1. He reports that the pain started 2 weeks ago but has gotten worse over the past 24 hours. He denies any numbness or tingling. The pain radiates to the axilla and the upper part of the left chest. He describes the pain as sharp and shooting. The pain initially started in his left shoulder but radiated to the left side of the neck and into the back of his head. The pain is constant and does not come and go.     Current Medication: Outpatient Encounter Medications as of 01/04/2021  Medication Sig   amLODipine (NORVASC) 10 MG tablet Take 1 tablet (10 mg total) by mouth daily.   aspirin EC 81 MG tablet Take 81 mg by mouth daily.    atorvastatin (LIPITOR) 10 MG tablet Take 1 tablet (10 mg total) by mouth at bedtime.   carvedilol (COREG) 12.5 MG tablet TAKE 1 TABLET BY MOUTH TWICE DAILY WITH A MEAL   Cholecalciferol (VITAMIN D3) 5000 units TABS Take 5,000 Units by mouth daily.    CINNAMON PO Take by mouth daily.    fluticasone (FLONASE) 50 MCG/ACT nasal spray Place 2 sprays into both nostrils daily.   gabapentin (NEURONTIN) 100 MG capsule Take 1 capsule (100 mg total) by mouth daily.   glucose blood test strip Check blood sugars TID   Insulin Pen Needle (BD PEN NEEDLE NANO U/F) 32G X 4 MM MISC USE AS DIRECTED  TWICE  A DAILY DIAG E11.65   lisinopril-hydrochlorothiazide (ZESTORETIC) 20-12.5 MG tablet Take  2 tab po once daily for hypertension   nitroGLYCERIN (NITROSTAT) 0.4 MG SL tablet DISSOLVE ONE TABLET UNDER THE TONGUE EVERY 5 MINUTES AS NEEDED FOR CHEST PAIN.  DO NOT EXCEED A TOTAL OF 3 DOSES IN 15 MINUTES   pantoprazole (PROTONIX) 40 MG tablet Take 1 tablet (40 mg total) by mouth daily.   tadalafil (CIALIS) 10 MG tablet Take 1 tablet (10 mg total) by mouth daily as needed for erectile dysfunction.   tamsulosin (FLOMAX) 0.4 MG CAPS capsule TAKE 1 CAPSULE BY MOUTH ONE-HALF HOUR AFTER THE SAME MEAL EACH DAY   TOUJEO SOLOSTAR 300 UNIT/ML Solostar Pen Inject 40 units [place] daily. May increase up to 45 units as indicated.   [EXPIRED] traMADol (ULTRAM) 50 MG tablet Take 1 tablet (50 mg total) by mouth every 8 (eight) hours as needed for up to 5 days.   VICTOZA 18 MG/3ML SOPN INJECT 1.8MG  SUBCUTANEOUSLY DAILY AS DIRECTED   vitamin C (ASCORBIC ACID) 500 MG tablet Take 500 mg by mouth daily.    [DISCONTINUED] meloxicam (MOBIC) 7.5 MG tablet Take 1 tablet (7.5 mg total) by mouth daily.   spironolactone (ALDACTONE) 25 MG tablet Take 1 tablet (25 mg total) by mouth daily.   [DISCONTINUED] ketorolac (TORADOL) 10 MG tablet Take 1 tablet (10 mg total) by mouth every 8 (  eight) hours. (Patient not taking: Reported on 01/04/2021)   No facility-administered encounter medications on file as of 01/04/2021.    Surgical History: History reviewed. No pertinent surgical history.  Medical History: Past Medical History:  Diagnosis Date   Diabetes mellitus without complication (HCC)    Hyperlipidemia    Hypertension    Left bundle branch block     Family History: Family History  Problem Relation Age of Onset   Hypertension Mother    Hypertension Father    Heart attack Brother [id number]    Social History   Socioeconomic History   Marital status: Widowed    Spouse name: Not on file   Number of children: Not on file   Years of education: Not on file   Highest education level: Not on file  Occupational History    Not on file  Tobacco Use   Smoking status: Former    Packs/day: 0.40    Years: 8.00    Pack years: 3.20    Types: Cigarettes    Quit date: 2000    Years since quitting: 22.5   Smokeless tobacco: Never  Vaping Use   Vaping Use: Never used  Substance and Sexual Activity   Alcohol use: Never   Drug use: Never   Sexual activity: Not on file  Other Topics Concern   Not on file  Social History Narrative   Not on file   Social Determinants of Health   Financial Resource Strain: Not on file  Food Insecurity: Not on file  Transportation Needs: Not on file  Physical Activity: Not on file  Stress: Not on file  Social Connections: Not on file  Intimate Partner Violence: Not on file      Review of Systems  Constitutional:  Negative for chills, fatigue and unexpected weight change.  HENT:  Negative for congestion, rhinorrhea, sneezing and sore throat.   Eyes:  Negative for redness.  Respiratory:  Negative for cough, chest tightness and shortness of breath.   Cardiovascular:  Negative for chest pain and palpitations.  Gastrointestinal:  Negative for abdominal pain, constipation, diarrhea, nausea and vomiting.  Genitourinary:  Negative for dysuria and frequency.  Musculoskeletal:  Positive for arthralgias (left shoulder) and neck pain. Negative for back pain and joint swelling.  Skin:  Negative for rash.  Neurological: Negative.  Negative for tremors and numbness.  Hematological:  Negative for adenopathy. Does not bruise/bleed easily.  Psychiatric/Behavioral:  Negative for behavioral problems (Depression), sleep disturbance and suicidal ideas. The patient is not nervous/anxious.    Vital Signs: BP (!) 149/86   Pulse 74   Temp 97.8 F (36.6 C)   Resp 16   Ht 6\' 2"  (1.88 m)   Wt 210 lb 3.2 oz (95.3 kg)   SpO2 96%   BMI 26.99 kg/m    Physical Exam Vitals reviewed.  Constitutional:      General: He is not in acute distress.    Appearance: He is well-developed. He is not  diaphoretic.  HENT:     Head: Normocephalic and atraumatic.     Mouth/Throat:     Pharynx: No oropharyngeal exudate.  Eyes:     Pupils: Pupils are equal, round, and reactive to light.  Neck:     Thyroid: No thyroid mass, thyromegaly or thyroid tenderness.     Vascular: Normal carotid pulses. No carotid bruit, hepatojugular reflux or JVD.     Trachea: No tracheal deviation.  Cardiovascular:     Rate and Rhythm: Normal rate and  regular rhythm.     Heart sounds: Normal heart sounds. No murmur heard.   No friction rub. No gallop.  Pulmonary:     Effort: Pulmonary effort is normal. No respiratory distress.     Breath sounds: No wheezing or rales.  Chest:     Chest wall: No tenderness.  Abdominal:     General: Bowel sounds are normal.     Palpations: Abdomen is soft.  Musculoskeletal:     Left shoulder: Decreased range of motion.     Cervical back: Neck supple. Pain with movement present. Decreased range of motion.  Lymphadenopathy:     Cervical: No cervical adenopathy.  Skin:    General: Skin is warm and dry.  Neurological:     Mental Status: He is alert and oriented to person, place, and time.     Cranial Nerves: No cranial nerve deficit.  Psychiatric:        Behavior: Behavior normal.        Thought Content: Thought content normal.        Judgment: Judgment normal.    Assessment/Plan: 1. Type 2 diabetes mellitus with hyperglycemia, with long-term current use of insulin (HCC) A1C is improving, it was 7.1 today. Continue current medications, no changes. No refills needed.  - POCT glycosylated hemoglobin (Hb A1C)  2. Acute neck pain Acute neck pain related to left shoulder pain. EKG done to rule out cardiac rhythm abnormalities. 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 Xray of cervical spine ordered to rule out skeletal abnormalities. Tramadol  ordered to help manage pain.  - traMADol (ULTRAM) 50 MG tablet; Take 1 tablet (50 mg total) by mouth every 8 (eight) hours as needed for up to 5 days.  Dispense: 15 tablet; Refill: 0 - DG Cervical Spine Complete; Future - EKG 12-Lead  3. Acute pain of left shoulder EKG done please see problem #2 for results. Xray of left shoulder ordered to rule out skeletal abnormalities. Tramadol ordered to help manage pain.  - traMADol (ULTRAM) 50 MG tablet; Take 1 tablet (50 mg total) by mouth every 8 (eight) hours as needed for up to 5 days.  Dispense: 15 tablet; Refill: 0 - DG Shoulder Left; Future - EKG 12-Lead   General Counseling: Gabriel Burton verbalizes understanding of the findings of todays visit and agrees with plan of treatment. I have discussed any further diagnostic evaluation that may be needed or ordered today. We also reviewed his medications today. he has been encouraged to call the office with any questions or concerns that should arise related to todays visit.    Orders Placed This Encounter  Procedures   DG Cervical Spine Complete   DG Shoulder Left   POCT glycosylated hemoglobin (Hb A1C)   EKG 12-Lead    Meds ordered this encounter  Medications   DISCONTD: meloxicam (MOBIC) 7.5 MG tablet    Sig: Take 1 tablet (7.5 mg total) by mouth daily.    Dispense:  30 tablet    Refill:  0   traMADol (ULTRAM) 50 MG tablet    Sig: Take 1 tablet (50 mg total) by mouth every 8 (eight) hours as needed for up to 5 days.    Dispense:  15 tablet    Refill:  0    Return in about 3 months (around 04/06/2021) for F/U, Recheck A1C, Anaisha Mago PCP.  Total time spent:30 Minutes Time spent includes review of chart, medications, test results, and follow up plan with the patient.   [place] Controlled Substance Database was reviewed by me.  This patient was seen by Sallyanne Kuster, FNP-C in collaboration with Dr. Beverely Risen as a part of collaborative care agreement.   Derita Michelsen R. Tedd Sias, MSN,  FNP-C Internal medicine

## 2021-01-06 ENCOUNTER — Ambulatory Visit: Payer: BC Managed Care – PPO | Admitting: Nurse Practitioner

## 2021-01-11 ENCOUNTER — Other Ambulatory Visit: Payer: Self-pay | Admitting: Nurse Practitioner

## 2021-01-11 DIAGNOSIS — I10 Essential (primary) hypertension: Secondary | ICD-10-CM

## 2021-01-12 ENCOUNTER — Other Ambulatory Visit: Payer: Self-pay | Admitting: Nurse Practitioner

## 2021-01-12 ENCOUNTER — Encounter: Payer: Self-pay | Admitting: Internal Medicine

## 2021-01-12 ENCOUNTER — Ambulatory Visit: Payer: BC Managed Care – PPO | Admitting: Internal Medicine

## 2021-01-12 ENCOUNTER — Other Ambulatory Visit: Payer: Self-pay

## 2021-01-12 VITALS — BP 148/80 | HR 82 | Ht 74.0 in | Wt 207.0 lb

## 2021-01-12 DIAGNOSIS — E785 Hyperlipidemia, unspecified: Secondary | ICD-10-CM

## 2021-01-12 DIAGNOSIS — I10 Essential (primary) hypertension: Secondary | ICD-10-CM

## 2021-01-12 DIAGNOSIS — R079 Chest pain, unspecified: Secondary | ICD-10-CM | POA: Diagnosis not present

## 2021-01-12 DIAGNOSIS — Z79899 Other long term (current) drug therapy: Secondary | ICD-10-CM | POA: Diagnosis not present

## 2021-01-12 DIAGNOSIS — E1169 Type 2 diabetes mellitus with other specified complication: Secondary | ICD-10-CM | POA: Diagnosis not present

## 2021-01-12 NOTE — Patient Instructions (Signed)
Medication Instructions:  Your physician recommends that you continue on your current medications as directed. Please refer to the Current Medication list given to you today. *If you need a refill on your cardiac medications before your next appointment, please call your pharmacy* Lab Work: BMET today If you have labs (blood work) drawn today and your tests are completely normal, you will receive your results only by: MyChart Message (if you have MyChart) OR A paper copy in the mail If you have any lab test that is abnormal or we need to change your treatment, we will call you to review the results. Testing/Procedures: None ordered Follow-Up: At [facility], you and your health needs are our priority.  As part of our continuing mission to provide you with exceptional heart care, we have created designated Provider Care Teams.  These Care Teams include your primary Cardiologist (physician) and Advanced Practice Providers (APPs -  Physician Assistants and Nurse Practitioners) who all work together to provide you with the care you need, when you need it. We recommend signing up for the patient portal called "MyChart".  Sign up information is provided on this After Visit Summary.  MyChart is used to connect with patients for Virtual Visits (Telemedicine).  Patients are able to view lab/test results, encounter notes, upcoming appointments, etc.  Non-urgent messages can be sent to your provider as well.   To learn more about what you can do with MyChart, go to [email or web address].   Your next appointment:   3 month(s)  The format for your next appointment:   In Person  Provider:   You may see Yvonne Kendall, MD or one of the following Advanced Practice Providers on your designated Care Team:   Nicolasa Ducking, NP Eula Listen, PA-C Marisue Ivan, PA-C Cadence Fransico Michael, [place] Gillian Shields, NP Other Instructions  DASH Eating Plan DASH stands for Dietary Approaches to Stop Hypertension.  The DASH eating plan is a healthy eating plan that has been shown to: Reduce high blood pressure (hypertension). Reduce your risk for type 2 diabetes, heart disease, and stroke. Help with weight loss. What are tips for following this plan? Reading food labels Check food labels for the amount of salt (sodium) per serving. Choose foods with less than 5 percent of the Daily Value of sodium. Generally, foods with less than 300 milligrams (mg) of sodium per serving fit into this eating plan. To find whole grains, look for the word "whole" as the first word in the ingredient list. Shopping Buy products labeled as "low-sodium" or "no salt added." Buy fresh foods. Avoid canned foods and pre-made or frozen meals. Cooking Avoid adding salt when cooking. Use salt-free seasonings or herbs instead of table salt or sea salt. Check with your health care provider or pharmacist before using salt substitutes. Do not fry foods. Cook foods using healthy methods such as baking, boiling, grilling, roasting, and broiling instead. Cook with heart-healthy oils, such as olive, canola, avocado, soybean, or sunflower oil. Meal planning  Eat a balanced diet that includes: 4 or more servings of fruits and 4 or more servings of vegetables each day. Try to fill one-half of your plate with fruits and vegetables. 6-8 servings of whole grains each day. Less than 6 oz (170 g) of lean meat, poultry, or fish each day. A 3-oz (85-g) serving of meat is about the same size as a deck of cards. One egg equals 1 oz (28 g). 2-3 servings of low-fat dairy each day. One serving is 1 cup (  237 mL). 1 serving of nuts, seeds, or beans 5 times each week. 2-3 servings of heart-healthy fats. Healthy fats called omega-3 fatty acids are found in foods such as walnuts, flaxseeds, fortified milks, and eggs. These fats are also found in cold-water fish, such as sardines, salmon, and mackerel. Limit how much you eat of: Canned or prepackaged  foods. Food that is high in trans fat, such as some fried foods. Food that is high in saturated fat, such as fatty meat. Desserts and other sweets, sugary drinks, and other foods with added sugar. Full-fat dairy products. Do not salt foods before eating. Do not eat more than 4 egg yolks a week. Try to eat at least 2 vegetarian meals a week. Eat more home-cooked food and less restaurant, buffet, and fast food.  Lifestyle When eating at a restaurant, ask that your food be prepared with less salt or no salt, if possible. If you drink alcohol: Limit how much you use to: 0-1 drink a day for women who are not pregnant. 0-2 drinks a day for men. Be aware of how much alcohol is in your drink. In the [place] one drink equals one 12 oz bottle of beer (355 mL), one 5 oz glass of wine (148 mL), or one 1 oz glass of hard liquor (44 mL). General information Avoid eating more than 2,300 mg of salt a day. If you have hypertension, you may need to reduce your sodium intake to 1,500 mg a day. Work with your health care provider to maintain a healthy body weight or to lose weight. Ask what an ideal weight is for you. Get at least 30 minutes of exercise that causes your heart to beat faster (aerobic exercise) most days of the week. Activities may include walking, swimming, or biking. Work with your health care provider or dietitian to adjust your eating plan to your individual calorie needs. What foods should I eat? Fruits All fresh, dried, or frozen fruit. Canned fruit in natural juice (without addedsugar). Vegetables Fresh or frozen vegetables (raw, steamed, roasted, or grilled). Low-sodium or reduced-sodium tomato and vegetable juice. Low-sodium or reduced-sodium tomatosauce and tomato paste. Low-sodium or reduced-sodium canned vegetables. Grains Whole-grain or whole-wheat bread. Whole-grain or whole-wheat pasta. Brown rice. Orpah Cobb. Bulgur. Whole-grain and low-sodium cereals. Pita bread.Low-fat,  low-sodium crackers. Whole-wheat flour tortillas. Meats and other proteins Skinless chicken or [place]. Ground chicken or [place]. Pork with fat trimmed off. Fish and seafood. Egg whites. Dried beans, peas, or lentils. Unsalted nuts, nut butters, and seeds. Unsalted canned beans. Lean cuts of beef with fat trimmed off. Low-sodium, lean precooked or cured meat, such as sausages or meatloaves. Dairy Low-fat (1%) or fat-free (skim) milk. Reduced-fat, low-fat, or fat-free cheeses. Nonfat, low-sodium ricotta or cottage cheese. Low-fat or nonfatyogurt. Low-fat, low-sodium cheese. Fats and oils Soft margarine without trans fats. Vegetable oil. Reduced-fat, low-fat, or light mayonnaise and salad dressings (reduced-sodium). Canola, safflower, olive, avocado, soybean, andsunflower oils. Avocado. Seasonings and condiments Herbs. Spices. Seasoning mixes without salt. Other foods Unsalted popcorn and pretzels. Fat-free sweets. The items listed above may not be a complete list of foods and beverages you can eat. Contact a dietitian for more information. What foods should I avoid? Fruits Canned fruit in a light or heavy syrup. Fried fruit. Fruit in cream or buttersauce. Vegetables Creamed or fried vegetables. Vegetables in a cheese sauce. Regular canned vegetables (not low-sodium or reduced-sodium). Regular canned tomato sauce and paste (not low-sodium or reduced-sodium). Regular tomato and vegetable juice(not low-sodium or reduced-sodium).  Rosita Fire. Olives. Grains Baked goods made with fat, such as croissants, muffins, or some breads. Drypasta or rice meal packs. Meats and other proteins Fatty cuts of meat. Ribs. Fried meat. Tomasa Blase. Bologna, salami, and other precooked or cured meats, such as sausages or meat loaves. Fat from the back of a pig (fatback). Bratwurst. Salted nuts and seeds. Canned beans with added salt. Canned orsmoked fish. Whole eggs or egg yolks. Chicken or [place] with skin. Dairy Whole or 2%  milk, cream, and half-and-half. Whole or full-fat cream cheese. Whole-fat or sweetened yogurt. Full-fat cheese. Nondairy creamers. Whippedtoppings. Processed cheese and cheese spreads. Fats and oils Butter. Stick margarine. Lard. Shortening. Ghee. Bacon fat. Tropical oils, suchas coconut, palm kernel, or palm oil. Seasonings and condiments Onion salt, garlic salt, seasoned salt, table salt, and sea salt. Worcestershire sauce. Tartar sauce. Barbecue sauce. Teriyaki sauce. Soy sauce, including reduced-sodium. Steak sauce. Canned and packaged gravies. Fish sauce. Oyster sauce. Cocktail sauce. Store-bought horseradish. Ketchup. Mustard. Meat flavorings and tenderizers. Bouillon cubes. Hot sauces. Pre-made or packaged marinades. Pre-made or packaged taco seasonings. Relishes. Regular saladdressings. Other foods Salted popcorn and pretzels. The items listed above may not be a complete list of foods and beverages you should avoid. Contact a dietitian for more information. Where to find more information National Heart, Lung, and Blood Institute: PopSteam.is American Heart Association: www.heart.org Academy of Nutrition and Dietetics: www.eatright.org National Kidney Foundation: www.kidney.org Summary The DASH eating plan is a healthy eating plan that has been shown to reduce high blood pressure (hypertension). It may also reduce your risk for type 2 diabetes, heart disease, and stroke. When on the DASH eating plan, aim to eat more fresh fruits and vegetables, whole grains, lean proteins, low-fat dairy, and heart-healthy fats. With the DASH eating plan, you should limit salt (sodium) intake to 2,300 mg a day. If you have hypertension, you may need to reduce your sodium intake to 1,500 mg a day. Work with your health care provider or dietitian to adjust your eating plan to your individual calorie needs. This information is not intended to replace advice given to you by your health care provider. Make  sure you discuss any questions you have with your healthcare provider. Document Revised: 06/06/2019 Document Reviewed: 06/06/2019 Elsevier Patient Education  2022 [employer].

## 2021-01-12 NOTE — Progress Notes (Signed)
Follow-up Outpatient Visit Date: 01/12/2021  Primary Care Provider: Lyndon Code, MD [address] [place] [address]  Chief Complaint: Chest pain and hypertension  HPI:  [name] is a 70 y.o. male with history of hypertension, hyperlipidemia, type 2 diabetes mellitus, and chronic left bundle branch block, who presents for follow-up of resistant hypertension.  I last saw him in March, at which time [name] was feeling relatively well.  He noted chronic left ankle edema as well as rare palpitations lasting only 1 to 2 seconds.  Due to persistently elevated blood pressure, we advised him to begin taking spironolactone 25 mg daily.  He reached out to our office last week complaining of intermittent chest discomfort.  1 to 2 weeks ago, [name] began having left-sided upper chest/shoulder pain that subsequently radiated to his neck and back of the head.  It was very painful especially when he tried to turn his head left or right.  He denies trauma or other inciting factors.  He was evaluated by his PCP and given a prescription for meloxicam.  Instead, he has been taking acetaminophen and ibuprofen at home with resolution of the pain and only mild stiffness when he tries to turn his head and neck.  He notes occasional other chest pains that prompted him to take nitroglycerin.  He has a hard time describing these further.  He denies shortness of breath, palpitations, and lightheadedness.  He has chronic intermittent left ankle edema that is unchanged.  He walks regularly and also does calisthenics at home without any difficulty.  He notes that his home blood pressure readings are typically in the 140-160 range.  He tries to watch his salt intake but occasionally still salts his food.  --------------------------------------------------------------------------------------------------  Past Medical History:  Diagnosis Date   Diabetes mellitus without complication (HCC)    Hyperlipidemia     Hypertension    Left bundle branch block    History reviewed. No pertinent surgical history.  Recent CV Pertinent Labs: Lab Results  Component Value Date   CHOL 148 10/24/2019   HDL 66 10/24/2019   LDLCALC 67 10/24/2019   TRIG 76 10/24/2019   K 4.1 09/29/2020   BUN 14 09/29/2020   CREATININE 1.34 (H) 09/29/2020    Past medical and surgical history were reviewed and updated in EPIC.  Current Meds  Medication Sig   amLODipine (NORVASC) 10 MG tablet Take 1 tablet (10 mg total) by mouth daily.   aspirin EC 81 MG tablet Take 81 mg by mouth daily.    atorvastatin (LIPITOR) 10 MG tablet Take 1 tablet (10 mg total) by mouth at bedtime.   carvedilol (COREG) 12.5 MG tablet TAKE 1 TABLET BY MOUTH TWICE DAILY WITH A MEAL   Cholecalciferol (VITAMIN D3) 5000 units TABS Take 5,000 Units by mouth daily.    CINNAMON PO Take by mouth daily.    fluticasone (FLONASE) 50 MCG/ACT nasal spray Place 2 sprays into both nostrils daily.   gabapentin (NEURONTIN) 100 MG capsule Take 1 capsule (100 mg total) by mouth daily.   glucose blood test strip Check blood sugars TID   Insulin Pen Needle (BD PEN NEEDLE NANO U/F) 32G X 4 MM MISC USE AS DIRECTED  TWICE  A DAILY DIAG E11.65   lisinopril-hydrochlorothiazide (ZESTORETIC) 20-12.5 MG tablet Take 2 tab po once daily for hypertension   nitroGLYCERIN (NITROSTAT) 0.4 MG SL tablet DISSOLVE ONE TABLET UNDER THE TONGUE EVERY 5 MINUTES AS NEEDED FOR CHEST PAIN.  DO  NOT EXCEED A TOTAL OF 3 DOSES IN 15 MINUTES   pantoprazole (PROTONIX) 40 MG tablet Take 1 tablet (40 mg total) by mouth daily.   spironolactone (ALDACTONE) 25 MG tablet Take 1 tablet (25 mg total) by mouth daily.   tadalafil (CIALIS) 10 MG tablet Take 1 tablet (10 mg total) by mouth daily as needed for erectile dysfunction.   tamsulosin (FLOMAX) 0.4 MG CAPS capsule TAKE 1 CAPSULE BY MOUTH ONE-HALF HOUR AFTER THE SAME MEAL EACH DAY   TOUJEO SOLOSTAR 300 UNIT/ML Solostar Pen Inject 40 units [place] daily. May  increase up to 45 units as indicated.   VICTOZA 18 MG/3ML SOPN INJECT 1.8MG  SUBCUTANEOUSLY DAILY AS DIRECTED   vitamin C (ASCORBIC ACID) 500 MG tablet Take 500 mg by mouth daily.    [DISCONTINUED] meloxicam (MOBIC) 7.5 MG tablet Take 1 tablet (7.5 mg total) by mouth daily.    Allergies: Patient has no known allergies.  Social History   Tobacco Use   Smoking status: Former    Packs/day: 0.40    Years: 8.00    Pack years: 3.20    Types: Cigarettes    Quit date: 2000    Years since quitting: 22.5   Smokeless tobacco: Never  Vaping Use   Vaping Use: Never used  Substance Use Topics   Alcohol use: Never   Drug use: Never    Family History  Problem Relation Age of Onset   Hypertension Mother    Hypertension Father    Heart attack Brother 79    Review of Systems: [name] notes spot on the posterior aspect of his left arm that developed a few weeks ago.  He wonders if it was a tick bite.  Otherwise, a 12-system review of systems was performed and was negative except as noted in the HPI.  --------------------------------------------------------------------------------------------------  Physical Exam: BP (!) 148/80 (BP Location: Left Arm, Patient Position: Sitting, Cuff Size: Large)   Pulse 82   Ht 6\' 2"  (1.88 m)   Wt 207 lb (93.9 kg)   SpO2 97%   BMI 26.58 kg/m   General:  NAD. Neck: No JVD or HJR. Lungs: Clear to auscultation bilaterally without wheezes or crackles. Heart: Regular rate and rhythm without murmurs, rubs, or gallops. Abdomen: Soft, nontender, nondistended. Extremities: No lower extremity edema. Skin: Small hyperpigmented papule noted along posterior aspect of the left arm.  No erythema or tenderness.  EKG (01/04/2021): Normal sinus rhythm with first-degree AV block (PR interval 246 ms), left axis deviation, and left bundle branch block.  PR interval has increased slightly since 06/23/2020.  Otherwise, no significant interval change.  Lab Results   Component Value Date   WBC 4.4 06/23/2020   HGB 13.2 06/23/2020   HCT 40.6 06/23/2020   MCV 87.7 06/23/2020   PLT 211 06/23/2020    Lab Results  Component Value Date   NA 143 09/29/2020   K 4.1 09/29/2020   CL 102 09/29/2020   CO2 25 09/29/2020   BUN 14 09/29/2020   CREATININE 1.34 (H) 09/29/2020   GLUCOSE 108 (H) 09/29/2020   ALT 31 06/23/2020    Lab Results  Component Value Date   CHOL 148 10/24/2019   HDL 66 10/24/2019   LDLCALC 67 10/24/2019   TRIG 76 10/24/2019    --------------------------------------------------------------------------------------------------  ASSESSMENT AND PLAN: Chest pain: Recent chest and neck discomfort does not sound consistent with angina.  It has resolved with NSAID treatment.  [name] can continue to use NSAIDs as needed, though  I encouraged him to try to limit the use if he is not having any more symptoms due to potential for elevating his blood pressure as well as causing side effects with his other medications.  In regard to his intermittent discomfort for which he takes nitroglycerin, we discussed further ischemia evaluation (specifically coronary CTA) versus addition of a long-acting nitrate.  [name] wishes to defer any medication changes and testing at this time but will contact us if his symptoms worsen.  Hypertension: Blood pressure remained suboptimally controlled today (goal less than 130/80).  We discussed adding a long-acting nitrate or other agents such as an alpha-blocker or hydralazine, but have agreed to defer this in order to work on further sodium restriction and other lifestyle modifications.  [name] should contact us if his blood pressure is consistently above 130/80.  Given that he did not return for labs after addition of spironolactone at our last visit, we will check a BMP today.  Hyperlipidemia associated with type 2 diabetes mellitus: Most recent lipid panel in our system showed reasonable LDL control on  10/2019.  Continue atorvastatin 10 mg daily with ongoing follow-up by his PCP.  Follow-up: Return to clinic in 3 months.  Yvonne Kendall, MD 01/12/2021 8:48 AM

## 2021-01-13 LAB — BASIC METABOLIC PANEL
BUN/Creatinine Ratio: 11 (ref 10–24)
BUN: 17 mg/dL (ref 8–27)
CO2: 24 mmol/L (ref 20–29)
Calcium: 9.7 mg/dL (ref 8.6–10.2)
Chloride: 106 mmol/L (ref 96–106)
Creatinine, Ser: 1.49 mg/dL — ABNORMAL HIGH (ref 0.76–1.27)
Glucose: 109 mg/dL — ABNORMAL HIGH (ref 65–99)
Potassium: 4 mmol/L (ref 3.5–5.2)
Sodium: 146 mmol/L — ABNORMAL HIGH (ref 134–144)
eGFR: 51 mL/min/{1.73_m2} — ABNORMAL LOW (ref >59)

## 2021-01-16 ENCOUNTER — Other Ambulatory Visit: Payer: Self-pay | Admitting: Nurse Practitioner

## 2021-01-20 ENCOUNTER — Other Ambulatory Visit: Payer: Self-pay | Admitting: Nurse Practitioner

## 2021-01-20 ENCOUNTER — Other Ambulatory Visit: Payer: Self-pay

## 2021-01-20 MED ORDER — BD PEN NEEDLE NANO U/F 32G X 4 MM MISC: 3 refills | Status: DC

## 2021-01-21 ENCOUNTER — Other Ambulatory Visit: Payer: Self-pay

## 2021-01-21 MED ORDER — BD PEN NEEDLE NANO U/F 32G X 4 MM MISC: 3 refills | Status: DC

## 2021-01-25 ENCOUNTER — Other Ambulatory Visit: Payer: Self-pay | Admitting: Internal Medicine

## 2021-01-25 DIAGNOSIS — Z794 Long term (current) use of insulin: Secondary | ICD-10-CM

## 2021-01-25 DIAGNOSIS — E1165 Type 2 diabetes mellitus with hyperglycemia: Secondary | ICD-10-CM

## 2021-02-02 ENCOUNTER — Ambulatory Visit: Payer: BC Managed Care – PPO | Admitting: Internal Medicine

## 2021-02-22 ENCOUNTER — Other Ambulatory Visit: Payer: Self-pay | Admitting: Nurse Practitioner

## 2021-02-23 ENCOUNTER — Other Ambulatory Visit: Payer: Self-pay | Admitting: Nurse Practitioner

## 2021-02-23 ENCOUNTER — Other Ambulatory Visit: Payer: Self-pay | Admitting: Physician Assistant

## 2021-03-01 ENCOUNTER — Other Ambulatory Visit: Payer: Self-pay

## 2021-03-01 MED ORDER — AMLODIPINE BESYLATE 10 MG PO TABS: 10.0000 mg | ORAL_TABLET | Freq: Every day | ORAL | 5 refills | Status: DC

## 2021-03-24 ENCOUNTER — Other Ambulatory Visit: Payer: Self-pay | Admitting: Internal Medicine

## 2021-03-25 ENCOUNTER — Ambulatory Visit: Payer: BC Managed Care – PPO | Admitting: Internal Medicine

## 2021-04-04 ENCOUNTER — Other Ambulatory Visit: Payer: Self-pay | Admitting: Nurse Practitioner

## 2021-04-04 DIAGNOSIS — I10 Essential (primary) hypertension: Secondary | ICD-10-CM

## 2021-04-07 ENCOUNTER — Encounter: Payer: Self-pay | Admitting: Nurse Practitioner

## 2021-04-07 ENCOUNTER — Ambulatory Visit: Payer: BC Managed Care – PPO | Admitting: Nurse Practitioner

## 2021-04-07 ENCOUNTER — Other Ambulatory Visit: Payer: Self-pay

## 2021-04-07 VITALS — BP 142/90 | HR 71 | Ht 74.0 in | Wt 213.0 lb

## 2021-04-07 DIAGNOSIS — Z794 Long term (current) use of insulin: Secondary | ICD-10-CM

## 2021-04-07 DIAGNOSIS — I447 Left bundle-branch block, unspecified: Secondary | ICD-10-CM | POA: Diagnosis not present

## 2021-04-07 DIAGNOSIS — E782 Mixed hyperlipidemia: Secondary | ICD-10-CM

## 2021-04-07 DIAGNOSIS — I10 Essential (primary) hypertension: Secondary | ICD-10-CM | POA: Diagnosis not present

## 2021-04-07 DIAGNOSIS — E1140 Type 2 diabetes mellitus with diabetic neuropathy, unspecified: Secondary | ICD-10-CM

## 2021-04-07 MED ORDER — CARVEDILOL 12.5 MG PO TABS: 18.7500 mg | ORAL_TABLET | Freq: Two times a day (BID) | ORAL | 2 refills | Status: DC

## 2021-04-07 NOTE — Patient Instructions (Addendum)
Medication Instructions:  Your physician has recommended you make the following change in your medication:   INCREASE Carvedilol 25 mg and take one and one half (1/2) tablet twice a day.    *If you need a refill on your cardiac medications before your next appointment, please call your pharmacy*   Lab Work: None  If you have labs (blood work) drawn today and your tests are completely normal, you will receive your results only by: MyChart Message (if you have MyChart) OR A paper copy in the mail If you have any lab test that is abnormal or we need to change your treatment, we will call you to review the results.   Testing/Procedures: None   Follow-Up: At [facility], you and your health needs are our priority.  As part of our continuing mission to provide you with exceptional heart care, we have created designated Provider Care Teams.  These Care Teams include your primary Cardiologist (physician) and Advanced Practice Providers (APPs -  Physician Assistants and Nurse Practitioners) who all work together to provide you with the care you need, when you need it.  We recommend signing up for the patient portal called "MyChart".  Sign up information is provided on this After Visit Summary.  MyChart is used to connect with patients for Virtual Visits (Telemedicine).  Patients are able to view lab/test results, encounter notes, upcoming appointments, etc.  Non-urgent messages can be sent to your provider as well.   To learn more about what you can do with MyChart, go to [email or web address].    Your next appointment:   4 month(s)  The format for your next appointment:   In Person  Provider:   Yvonne Kendall, MD or Nicolasa Ducking, NP

## 2021-04-07 NOTE — Progress Notes (Signed)
Office Visit    Patient Name: Gabriel Burton Date of Encounter: 04/07/2021  Primary Care Provider:  Lyndon Code, MD Primary Cardiologist:  Yvonne Kendall, MD  Chief Complaint    70 year old male with history of hypertension, hyperlipidemia, type 2 diabetes mellitus, and left bundle branch block, who presents for follow-up related to hypertension.  Past Medical History    Past Medical History:  Diagnosis Date   Diabetes mellitus without complication (HCC)    Diastolic dysfunction    a. 09/2018 Echo: EF 63%, diast dysfxn. Mild conc LVH. Mild TR.   History of stress test    a. 09/2018 ETT: Ex time 3:50. BP 207/90. Inconclusive 2/2 LBBB.  Stopped 2/2 claudication; b. 12/2018 Lexi MV: fixed septal/apical defect - felt to be artifact 2/2 LBBB. Very mild cor Ca2+ and Ao atherosclerosis. Low-mod risk study. EF 43%.   Hyperlipidemia    Hypertension    a. 08/2020 Renal artery duplex: No evidence of RAS.   Left bundle branch block    History reviewed. No pertinent surgical history.  Allergies  No Known Allergies  History of Present Illness    70 year old male with the above past medical history including hypertension, hyperlipidemia, type 2 diabetes mellitus, and left bundle branch block.  He was previously evaluated with a treadmill stress test in March 2020, at which time he walked for 3 minutes and 50 seconds and had a hypertensive response with a blood pressure of 207/90.  The test was stopped secondary to lower extremity claudication, and was inconclusive secondary to left bundle branch block.  Echocardiogram in March 2020 showed an EF of 63%, with diastolic dysfunction, mild LVH, and mild TR.  He subsequently underwent Lexiscan Myoview in June 2020 showing a fixed septal/apical defect with an EF recorded at 43%.  This was felt to be artifactual secondary to left bundle branch block.  Mild coronary calcium and aortic atherosclerosis were noted, and medical therapy was recommended.   In the setting of resistant hypertension, he underwent renal arterial duplex in February 2022 which did not show any evidence of renal artery stenosis.  Gabriel Burton was last seen in cardiology clinic in June 2022, which time he reported left-sided upper chest/shoulder pain radiating to the neck and back of the head, which was worse with rotation of the head.  Symptoms improved with acetaminophen and ibuprofen.  He also reported intermittent chest discomfort for which he uses sublingual nitroglycerin.  Patient wished to defer any medication changes or additional testing.  Blood pressure was elevated 148/80 however, patient declined any additional medication changes.  Since his last visit, he feels as though he has done well.  He works at the [employer] and notes that he does a lot of heavy lifting and exertion throughout his day, moving boxes and bottles.  He does not experience exertional chest pain or dyspnea.  He occasionally checks his blood pressure at home and notes that it is typically in the 140s.  He is open to titration of his medication today.  He did have 1 episode of resting right and left chest discomfort since his last visit, for which he took a nitroglycerin with eventual relief.  This discomfort did not prevent him from going to work and doing his usual activities.  He denies palpitations, PND, orthopnea, dizziness, syncope, or early satiety.  Does have chronic, mild left greater than right lower extremity swelling.  Home Medications    Current Outpatient Medications  Medication Sig Dispense Refill  amLODipine (NORVASC) 10 MG tablet Take 1 tablet (10 mg total) by mouth daily. 30 tablet 5   aspirin EC 81 MG tablet Take 81 mg by mouth daily.      atorvastatin (LIPITOR) 10 MG tablet Take 1 tablet (10 mg total) by mouth at bedtime. 90 tablet 3   carvedilol (COREG) 12.5 MG tablet TAKE 1 TABLET BY MOUTH TWICE DAILY WITH A MEAL 180 tablet 0   Cholecalciferol (VITAMIN D3) 5000 units TABS Take  5,000 Units by mouth daily.      CINNAMON PO Take by mouth daily.      fluticasone (FLONASE) 50 MCG/ACT nasal spray Place 2 sprays into both nostrils daily. 16 g 6   gabapentin (NEURONTIN) 100 MG capsule Take 1 capsule (100 mg total) by mouth daily. 90 capsule 1   glucose blood test strip Check blood sugars TID 100 each 12   Insulin Pen Needle (BD PEN NEEDLE NANO U/F) 32G X 4 MM MISC USE AS DIRECTED  TWICE  A DAILY DIAG E11.65 100 each 3   lisinopril-hydrochlorothiazide (ZESTORETIC) 20-12.5 MG tablet TAKE 2 TABLETS BY MOUTH ONCE DAILY FOR HIGH BLOOD PRESSURE 180 tablet 1   nitroGLYCERIN (NITROSTAT) 0.4 MG SL tablet DISSOLVE ONE TABLET UNDER THE TONGUE EVERY 5 MINUTES AS NEEDED FOR CHEST PAIN.  DO NOT EXCEED A TOTAL OF 3 DOSES IN 15 MINUTES 25 tablet 4   pantoprazole (PROTONIX) 40 MG tablet Take 1 tablet (40 mg total) by mouth daily. 90 tablet 3   spironolactone (ALDACTONE) 25 MG tablet Take 1 tablet by mouth once daily 90 tablet 0   tadalafil (CIALIS) 10 MG tablet Take 1 tablet (10 mg total) by mouth daily as needed for erectile dysfunction. 10 tablet 2   tamsulosin (FLOMAX) 0.4 MG CAPS capsule TAKE 1 CAPSULE BY MOUTH ONE-HALF HOUR AFTER THE SAME MEAL EACH DAY 90 capsule 1   TOUJEO SOLOSTAR 300 UNIT/ML Solostar Pen Inject 40 units [place] daily. May increase up to 45 units as indicated. 35 mL 3   VICTOZA 18 MG/3ML SOPN INJECT 1.8MG  UNDER THE SKIN DAILY AS DIRECTED 9 mL 3   vitamin C (ASCORBIC ACID) 500 MG tablet Take 500 mg by mouth daily.      No current facility-administered medications for this visit.     Review of Systems    1 episode of resting chest discomfort since his last visit.  No exertional chest pain or dyspnea.  He does have chronic, mild left greater than right lower extremity swelling in the setting of being on his feet all day.  He denies palpitations, PND, orthopnea, dizziness, syncope, or early satiety.  All other systems reviewed and are otherwise negative except as noted  above.  Physical Exam    VS:  BP (!) 142/90 (BP Location: Left Arm, Patient Position: Sitting, Cuff Size: Normal)   Pulse 71   Ht 6\' 2"  (1.88 m)   Wt 213 lb (96.6 kg)   SpO2 97%   BMI 27.35 kg/m  , BMI Body mass index is 27.35 kg/m.     GEN: Well nourished, well developed, in no acute distress. HEENT: normal. Neck: Supple, no JVD, carotid bruits, or masses. Cardiac: RRR, no murmurs, rubs, or gallops. No clubbing, cyanosis, 1+ left ankle edema with trace to 1+ right ankle edema.  Radials 2+/PT 1+ and equal bilaterally.  Respiratory:  Respirations regular and unlabored, clear to auscultation bilaterally. GI: Soft, nontender, nondistended, BS + x 4. MS: no deformity or atrophy. Skin: warm and  dry, no rash. Neuro:  Strength and sensation are intact. Psych: Normal affect.  Accessory Clinical Findings    ECG personally reviewed by me today -regular sinus rhythm, 71, first-degree AV block, left axis deviation, LVH, left bundle branch block- no acute changes.  Lab Results  Component Value Date   WBC 4.4 06/23/2020   HGB 13.2 06/23/2020   HCT 40.6 06/23/2020   MCV 87.7 06/23/2020   PLT 211 06/23/2020   Lab Results  Component Value Date   CREATININE 1.49 (H) 01/12/2021   BUN 17 01/12/2021   NA 146 (H) 01/12/2021   K 4.0 01/12/2021   CL 106 01/12/2021   CO2 24 01/12/2021   Lab Results  Component Value Date   ALT 31 06/23/2020   AST 29 06/23/2020   ALKPHOS 98 06/23/2020   BILITOT 0.8 06/23/2020   Lab Results  Component Value Date   CHOL 148 10/24/2019   HDL 66 10/24/2019   LDLCALC 67 10/24/2019   TRIG 76 10/24/2019    Lab Results  Component Value Date   HGBA1C 7.1 (A) 01/04/2021    Assessment & Plan    1.  Essential hypertension: Historically poorly controlled.  Normal renal artery duplex earlier this year.  Pressure 142/90 today.  He says this is about what he sees on his cuff at home.  He was previously reluctant to change his regimen however today, we agreed  to increase his carvedilol to 18.75 mg twice daily.  He otherwise remains on amlodipine 10 mg daily, lisinopril HCTZ 20/12.5 mg daily, and spironolactone 25 daily.  Have encouraged him to follow his blood pressure more closely at home and contact us for systolics greater than 140, at which time we can further titrate lisinopril HCTZ.  2.  Chest pain: Patient with a history of intermittent chest discomfort for which she will sometimes use a sublingual nitroglycerin.  He has had 1 episode since his visit in June.  Symptoms can historically last an entire day and nitroglycerin does not generally provide immediate relief.  Despite symptoms, he notes good activity tolerance and has never had exertional chest pain or dyspnea.  Prior stress test in 2020 was low to moderate risk in the setting of a fixed septal/apical defect felt to be artifactual in the setting of left bundle branch block.  Overall, patient feels symptoms are stable and is not interested in additional ischemic testing at this time.  Adjusting blood pressure regimen as noted above.  3.  Hyperlipidemia: Followed by primary care.  LDL was 67 in April 2021.  Continue statin therapy.  4.  Type 2 diabetes mellitus: A1c 7.1.  Followed close by primary care.  5.  Venous stasis: Patient with chronic mild left greater than right lower extremity edema.  On lisinopril-HCTZ.  Rec compression socks given that he is on his feet at work all day.  6.  Disposition: Follow-up in clinic in 4 months or sooner if necessary.   Nicolasa Ducking, NP 04/07/2021, 9:20 AM

## 2021-04-08 ENCOUNTER — Ambulatory Visit: Payer: BC Managed Care – PPO | Admitting: Nurse Practitioner

## 2021-04-22 ENCOUNTER — Other Ambulatory Visit: Payer: Self-pay | Admitting: Nurse Practitioner

## 2021-04-22 ENCOUNTER — Ambulatory Visit: Payer: BC Managed Care – PPO | Admitting: Nurse Practitioner

## 2021-04-29 ENCOUNTER — Encounter: Payer: Self-pay | Admitting: Nurse Practitioner

## 2021-04-29 ENCOUNTER — Other Ambulatory Visit: Payer: Self-pay

## 2021-04-29 ENCOUNTER — Encounter (INDEPENDENT_AMBULATORY_CARE_PROVIDER_SITE_OTHER): Payer: Self-pay

## 2021-04-29 ENCOUNTER — Ambulatory Visit: Payer: BC Managed Care – PPO | Admitting: Nurse Practitioner

## 2021-04-29 VITALS — BP 132/82 | HR 78 | Temp 98.3°F | Resp 16 | Ht 74.0 in | Wt 212.4 lb

## 2021-04-29 DIAGNOSIS — K2900 Acute gastritis without bleeding: Secondary | ICD-10-CM

## 2021-04-29 DIAGNOSIS — N528 Other male erectile dysfunction: Secondary | ICD-10-CM

## 2021-04-29 DIAGNOSIS — E785 Hyperlipidemia, unspecified: Secondary | ICD-10-CM

## 2021-04-29 DIAGNOSIS — E1169 Type 2 diabetes mellitus with other specified complication: Secondary | ICD-10-CM

## 2021-04-29 DIAGNOSIS — E1165 Type 2 diabetes mellitus with hyperglycemia: Secondary | ICD-10-CM | POA: Diagnosis not present

## 2021-04-29 DIAGNOSIS — K219 Gastro-esophageal reflux disease without esophagitis: Secondary | ICD-10-CM

## 2021-04-29 DIAGNOSIS — Z23 Encounter for immunization: Secondary | ICD-10-CM

## 2021-04-29 DIAGNOSIS — Z794 Long term (current) use of insulin: Secondary | ICD-10-CM

## 2021-04-29 DIAGNOSIS — E1140 Type 2 diabetes mellitus with diabetic neuropathy, unspecified: Secondary | ICD-10-CM

## 2021-04-29 DIAGNOSIS — J300 Vasomotor rhinitis: Secondary | ICD-10-CM

## 2021-04-29 LAB — POCT GLYCOSYLATED HEMOGLOBIN (HGB A1C): Hemoglobin A1C: 7 % — AB (ref 4.0–5.6)

## 2021-04-29 MED ORDER — VICTOZA 18 MG/3ML ~~LOC~~ SOPN: 1.8000 mg | PEN_INJECTOR | Freq: Every day | SUBCUTANEOUS | 3 refills | Status: DC

## 2021-04-29 MED ORDER — PANTOPRAZOLE SODIUM 40 MG PO TBEC: 40.0000 mg | DELAYED_RELEASE_TABLET | Freq: Every day | ORAL | 3 refills | Status: DC

## 2021-04-29 MED ORDER — TOUJEO SOLOSTAR 300 UNIT/ML ~~LOC~~ SOPN: PEN_INJECTOR | SUBCUTANEOUS | 3 refills | Status: DC

## 2021-04-29 MED ORDER — GABAPENTIN 100 MG PO CAPS: 100.0000 mg | ORAL_CAPSULE | Freq: Every day | ORAL | 1 refills | Status: DC

## 2021-04-29 MED ORDER — TAMSULOSIN HCL 0.4 MG PO CAPS: ORAL_CAPSULE | ORAL | 1 refills | Status: DC

## 2021-04-29 MED ORDER — FLUTICASONE PROPIONATE 50 MCG/ACT NA SUSP: 2.0000 | Freq: Every day | NASAL | 6 refills | Status: DC

## 2021-04-29 MED ORDER — GLUCOSE BLOOD VI STRP: ORAL_STRIP | 12 refills | Status: DC

## 2021-04-29 MED ORDER — TADALAFIL 20 MG PO TABS: 20.0000 mg | ORAL_TABLET | Freq: Every day | ORAL | 11 refills | Status: DC | PRN

## 2021-04-29 MED ORDER — METRONIDAZOLE 500 MG PO TABS
500.0000 mg | ORAL_TABLET | Freq: Two times a day (BID) | ORAL | 0 refills | Status: AC
Start: 2021-04-29 — End: 2021-05-13

## 2021-04-29 MED ORDER — METHOCARBAMOL 500 MG PO TABS: 500.0000 mg | ORAL_TABLET | Freq: Three times a day (TID) | ORAL | 0 refills | Status: DC

## 2021-04-29 MED ORDER — ATORVASTATIN CALCIUM 10 MG PO TABS: 10.0000 mg | ORAL_TABLET | Freq: Every day | ORAL | 3 refills | Status: DC

## 2021-04-29 NOTE — Progress Notes (Signed)
[facility] [address] [place], [place] [address]  Internal MEDICINE  Office Visit Note  Patient Name: Gabriel Burton  [id number]  [id number]  Date of Service: 04/29/2021  Chief Complaint  Patient presents with   Follow-up    refills   Diabetes   Hyperlipidemia   Hypertension    HPI Lejuan presents for a follow up visit for hypertension, hyperlipidemia and diabetes. He is in need of medication refills. He has also been having problems with abdominal pain, bloating and some episodes of diarrhea.    Current Medication: Outpatient Encounter Medications as of 04/29/2021  Medication Sig   amLODipine (NORVASC) 10 MG tablet Take 1 tablet (10 mg total) by mouth daily.   aspirin EC 81 MG tablet Take 81 mg by mouth daily.    carvedilol (COREG) 12.5 MG tablet Take 1.5 tablets (18.75 mg total) by mouth 2 (two) times daily with a meal.   Cholecalciferol (VITAMIN D3) 5000 units TABS Take 5,000 Units by mouth daily.    CINNAMON PO Take by mouth daily.    Insulin Pen Needle (BD PEN NEEDLE NANO U/F) 32G X 4 MM MISC USE AS DIRECTED  TWICE  A DAILY DIAG E11.65   lisinopril-hydrochlorothiazide (ZESTORETIC) 20-12.5 MG tablet TAKE 2 TABLETS BY MOUTH ONCE DAILY FOR HIGH BLOOD PRESSURE   methocarbamol (ROBAXIN) 500 MG tablet Take 1 tablet (500 mg total) by mouth 3 (three) times daily.   [EXPIRED] metroNIDAZOLE (FLAGYL) 500 MG tablet Take 1 tablet (500 mg total) by mouth 2 (two) times daily for 14 days.   nitroGLYCERIN (NITROSTAT) 0.4 MG SL tablet DISSOLVE ONE TABLET UNDER THE TONGUE EVERY 5 MINUTES AS NEEDED FOR CHEST PAIN.  DO NOT EXCEED A TOTAL OF 3 DOSES IN 15 MINUTES   spironolactone (ALDACTONE) 25 MG tablet Take 1 tablet by mouth once daily   tadalafil (CIALIS) 20 MG tablet Take 1 tablet (20 mg total) by mouth daily as needed for erectile dysfunction.   vitamin C (ASCORBIC ACID) 500 MG tablet Take 500 mg by mouth daily.    [DISCONTINUED] atorvastatin (LIPITOR) 10 MG tablet Take 1  tablet (10 mg total) by mouth at bedtime.   [DISCONTINUED] fluticasone (FLONASE) 50 MCG/ACT nasal spray Place 2 sprays into both nostrils daily.   [DISCONTINUED] gabapentin (NEURONTIN) 100 MG capsule Take 1 capsule (100 mg total) by mouth daily.   [DISCONTINUED] glucose blood test strip Check blood sugars TID   [DISCONTINUED] pantoprazole (PROTONIX) 40 MG tablet Take 1 tablet (40 mg total) by mouth daily.   [DISCONTINUED] tadalafil (CIALIS) 10 MG tablet Take 1 tablet (10 mg total) by mouth daily as needed for erectile dysfunction.   [DISCONTINUED] tamsulosin (FLOMAX) 0.4 MG CAPS capsule TAKE 1 CAPSULE BY MOUTH ONE-HALF HOUR AFTER THE SAME MEAL EACH DAY   [DISCONTINUED] TOUJEO SOLOSTAR 300 UNIT/ML Solostar Pen Inject 40 units [place] daily. May increase up to 45 units as indicated.   [DISCONTINUED] VICTOZA 18 MG/3ML SOPN INJECT 1.8MG  UNDER THE SKIN DAILY AS DIRECTED   atorvastatin (LIPITOR) 10 MG tablet Take 1 tablet (10 mg total) by mouth at bedtime.   fluticasone (FLONASE) 50 MCG/ACT nasal spray Place 2 sprays into both nostrils daily.   gabapentin (NEURONTIN) 100 MG capsule Take 1 capsule (100 mg total) by mouth daily.   glucose blood test strip Check blood sugars TID   liraglutide (VICTOZA) 18 MG/3ML SOPN Inject 1.8 mg into the skin daily.   pantoprazole (PROTONIX) 40 MG tablet Take 1 tablet (40 mg total) by  mouth daily.   tamsulosin (FLOMAX) 0.4 MG CAPS capsule TAKE 1 CAPSULE BY MOUTH ONE-HALF HOUR AFTER THE SAME MEAL EACH DAY   TOUJEO SOLOSTAR 300 UNIT/ML Solostar Pen Inject 40 units [place] daily. May increase up to 45 units as indicated.   No facility-administered encounter medications on file as of 04/29/2021.    Surgical History: History reviewed. No pertinent surgical history.  Medical History: Past Medical History:  Diagnosis Date   Diabetes mellitus without complication (HCC)    Diastolic dysfunction    a. 09/2018 Echo: EF 63%, diast dysfxn. Mild conc LVH. Mild TR.   History of stress  test    a. 09/2018 ETT: Ex time 3:50. BP 207/90. Inconclusive 2/2 LBBB.  Stopped 2/2 claudication; b. 12/2018 Lexi MV: fixed septal/apical defect - felt to be artifact 2/2 LBBB. Very mild cor Ca2+ and Ao atherosclerosis. Low-mod risk study. EF 43%.   Hyperlipidemia    Hypertension    a. 08/2020 Renal artery duplex: No evidence of RAS.   Left bundle branch block     Family History: Family History  Problem Relation Age of Onset   Hypertension Mother    Hypertension Father    Heart attack Brother 54    Social History   Socioeconomic History   Marital status: Widowed    Spouse name: Not on file   Number of children: Not on file   Years of education: Not on file   Highest education level: Not on file  Occupational History   Not on file  Tobacco Use   Smoking status: Former    Packs/day: 0.40    Years: 8.00    Pack years: 3.20    Types: Cigarettes    Quit date: 2000    Years since quitting: 22.8   Smokeless tobacco: Never  Vaping Use   Vaping Use: Never used  Substance and Sexual Activity   Alcohol use: Never   Drug use: Never   Sexual activity: Not on file  Other Topics Concern   Not on file  Social History Narrative   Not on file   Social Determinants of Health   Financial Resource Strain: Not on file  Food Insecurity: Not on file  Transportation Needs: Not on file  Physical Activity: Not on file  Stress: Not on file  Social Connections: Not on file  Intimate Partner Violence: Not on file      Review of Systems  Constitutional:  Negative for chills, fatigue and unexpected weight change.  HENT:  Negative for congestion, rhinorrhea, sneezing and sore throat.   Eyes:  Negative for redness.  Respiratory:  Negative for cough, chest tightness and shortness of breath.   Cardiovascular:  Negative for chest pain and palpitations.  Gastrointestinal:  Positive for abdominal distention (bloating), abdominal pain, diarrhea and nausea. Negative for constipation and  vomiting.  Genitourinary:  Negative for dysuria and frequency.  Musculoskeletal:  Negative for arthralgias, back pain, joint swelling and neck pain.  Skin:  Negative for rash.  Neurological: Negative.  Negative for tremors and numbness.  Hematological:  Negative for adenopathy. Does not bruise/bleed easily.  Psychiatric/Behavioral:  Negative for behavioral problems (Depression), sleep disturbance and suicidal ideas. The patient is not nervous/anxious.    Vital Signs: BP 132/82   Pulse 78   Temp 98.3 F (36.8 C)   Resp 16   Ht 6\' 2"  (1.88 m)   Wt 212 lb 6.4 oz (96.3 kg)   SpO2 98%   BMI 27.27 kg/m  Physical Exam Vitals reviewed.  Constitutional:      General: He is not in acute distress.    Appearance: Normal appearance. He is obese. He is not ill-appearing.  Eyes:     Extraocular Movements: Extraocular movements intact.     Pupils: Pupils are equal, round, and reactive to light.  Cardiovascular:     Rate and Rhythm: Normal rate and regular rhythm.  Pulmonary:     Effort: Pulmonary effort is normal. No respiratory distress.  Abdominal:     General: Bowel sounds are normal. There is distension.     Palpations: Abdomen is soft. There is no mass.     Tenderness: There is no abdominal tenderness. There is no guarding.     Hernia: No hernia is present.  Neurological:     Mental Status: He is alert and oriented to person, place, and time.     Cranial Nerves: No cranial nerve deficit.     Coordination: Coordination normal.     Gait: Gait normal.  Psychiatric:        Mood and Affect: Mood normal.        Behavior: Behavior normal.       Assessment/Plan: 1. Type 2 diabetes mellitus with diabetic neuropathy, with long-term current use of insulin (HCC) A1C showed minimal improvement but did come down by 0.1 to 7.0 refills ordered. Patient will work on diet and lifestyle modifications as discussed previously, repeat A1C in 3 months.  - POCT HgB A1C - liraglutide (VICTOZA)  18 MG/3ML SOPN; Inject 1.8 mg into the skin daily.  Dispense: 9 mL; Refill: 3 - TOUJEO SOLOSTAR 300 UNIT/ML Solostar Pen; Inject 40 units [place] daily. May increase up to 45 units as indicated.  Dispense: 35 mL; Refill: 3 - glucose blood test strip; Check blood sugars TID  Dispense: 100 each; Refill: 12 - gabapentin (NEURONTIN) 100 MG capsule; Take 1 capsule (100 mg total) by mouth daily.  Dispense: 90 capsule; Refill: 1  2. Other acute gastritis without hemorrhage Continue protonix, metronidazole prescribed to treat gastritis. H.pylori breath test ordered to rule out H. Pylori infection.  - pantoprazole (PROTONIX) 40 MG tablet; Take 1 tablet (40 mg total) by mouth daily.  Dispense: 90 tablet; Refill: 3 - metroNIDAZOLE (FLAGYL) 500 MG tablet; Take 1 tablet (500 mg total) by mouth 2 (two) times daily for 14 days.  Dispense: 28 tablet; Refill: 0 - H. pylori breath test  3. Hyperlipidemia associated with type 2 diabetes mellitus (HCC) Takes atorvastatin, refill ordered.  - atorvastatin (LIPITOR) 10 MG tablet; Take 1 tablet (10 mg total) by mouth at bedtime.  Dispense: 90 tablet; Refill: 3  4. Gastroesophageal reflux disease without esophagitis Continue protonix, refill ordered  - pantoprazole (PROTONIX) 40 MG tablet; Take 1 tablet (40 mg total) by mouth daily.  Dispense: 90 tablet; Refill: 3  5. Vasomotor rhinitis Flonase refill ordered. - fluticasone (FLONASE) 50 MCG/ACT nasal spray; Place 2 sprays into both nostrils daily.  Dispense: 16 g; Refill: 6  6. Other male erectile dysfunction Dose increased to 20 mg per patient report 10 mg was not helping.  - tadalafil (CIALIS) 20 MG tablet; Take 1 tablet (20 mg total) by mouth daily as needed for erectile dysfunction.  Dispense: 10 tablet; Refill: 11  7. Needs flu shot Administered in office today - Flu Vaccine MDCK QUAD PF   General Counseling: Bravlio verbalizes understanding of the findings of todays visit and agrees with plan of treatment. I  have discussed any further diagnostic evaluation  that may be needed or ordered today. We also reviewed his medications today. he has been encouraged to call the office with any questions or concerns that should arise related to todays visit.    Orders Placed This Encounter  Procedures   Flu Vaccine MDCK QUAD PF   H. pylori breath test   POCT HgB A1C    Meds ordered this encounter  Medications   atorvastatin (LIPITOR) 10 MG tablet    Sig: Take 1 tablet (10 mg total) by mouth at bedtime.    Dispense:  90 tablet    Refill:  3   liraglutide (VICTOZA) 18 MG/3ML SOPN    Sig: Inject 1.8 mg into the skin daily.    Dispense:  9 mL    Refill:  3   TOUJEO SOLOSTAR 300 UNIT/ML Solostar Pen    Sig: Inject 40 units [place] daily. May increase up to 45 units as indicated.    Dispense:  35 mL    Refill:  3    Please note increased dosing.   tamsulosin (FLOMAX) 0.4 MG CAPS capsule    Sig: TAKE 1 CAPSULE BY MOUTH ONE-HALF HOUR AFTER THE SAME MEAL EACH DAY    Dispense:  90 capsule    Refill:  1    Please consider 90 day supplies to promote better adherence   glucose blood test strip    Sig: Check blood sugars TID    Dispense:  100 each    Refill:  12    Patient will call when needs refill   tadalafil (CIALIS) 20 MG tablet    Sig: Take 1 tablet (20 mg total) by mouth daily as needed for erectile dysfunction.    Dispense:  10 tablet    Refill:  11    Dose increased, discontinue 10 mg tablet.   gabapentin (NEURONTIN) 100 MG capsule    Sig: Take 1 capsule (100 mg total) by mouth daily.    Dispense:  90 capsule    Refill:  1    Patient will call for refills when needed. Please fill as 90 day prescription.   pantoprazole (PROTONIX) 40 MG tablet    Sig: Take 1 tablet (40 mg total) by mouth daily.    Dispense:  90 tablet    Refill:  3    Please fill for 90 days, patient will call for refills.   fluticasone (FLONASE) 50 MCG/ACT nasal spray    Sig: Place 2 sprays into both nostrils daily.     Dispense:  16 g    Refill:  6    Patient will call for refills.   metroNIDAZOLE (FLAGYL) 500 MG tablet    Sig: Take 1 tablet (500 mg total) by mouth 2 (two) times daily for 14 days.    Dispense:  28 tablet    Refill:  0   methocarbamol (ROBAXIN) 500 MG tablet    Sig: Take 1 tablet (500 mg total) by mouth 3 (three) times daily.    Dispense:  60 tablet    Refill:  0    Return in about 3 months (around 07/30/2021) for F/U, Recheck A1C, Malena Timpone PCP.   Total time spent:30 Minutes Time spent includes review of chart, medications, test results, and follow up plan with the patient.   [place] Controlled Substance Database was reviewed by me.  This patient was seen by Sallyanne Kuster, FNP-C in collaboration with Dr. Beverely Risen as a part of collaborative care agreement.   Jacari Iannello R. Tedd Sias, MSN, FNP-C  Internal medicine

## 2021-05-08 LAB — H. PYLORI BREATH TEST: H pylori Breath Test: NEGATIVE

## 2021-06-05 ENCOUNTER — Other Ambulatory Visit: Payer: Self-pay | Admitting: Internal Medicine

## 2021-06-05 DIAGNOSIS — E1140 Type 2 diabetes mellitus with diabetic neuropathy, unspecified: Secondary | ICD-10-CM

## 2021-06-18 ENCOUNTER — Other Ambulatory Visit: Payer: Self-pay | Admitting: Internal Medicine

## 2021-06-20 ENCOUNTER — Other Ambulatory Visit: Payer: Self-pay | Admitting: Internal Medicine

## 2021-06-22 ENCOUNTER — Other Ambulatory Visit: Payer: Self-pay

## 2021-06-22 NOTE — Telephone Encounter (Signed)
*  STAT* If patient is at the pharmacy, call can be transferred to refill team.   1. Which medications need to be refilled? (please list name of each medication and dose if known) Carvedilol  2. Which pharmacy/location (including street and city if local pharmacy) is medication to be sent to? [facility] [place]  3. Do they need a 30 day or 90 day supply? 90

## 2021-06-24 ENCOUNTER — Other Ambulatory Visit: Payer: Self-pay | Admitting: Internal Medicine

## 2021-06-24 ENCOUNTER — Other Ambulatory Visit: Payer: Self-pay

## 2021-06-24 MED ORDER — CARVEDILOL 12.5 MG PO TABS: 18.7500 mg | ORAL_TABLET | Freq: Two times a day (BID) | ORAL | 0 refills | Status: DC

## 2021-06-24 NOTE — Telephone Encounter (Signed)
Patient calling to check status of refill.  °

## 2021-07-06 ENCOUNTER — Telehealth: Payer: Self-pay

## 2021-07-06 NOTE — Telephone Encounter (Signed)
Left vm and sent mychart message to confirm 07/08/21 appointment-Toni

## 2021-07-08 ENCOUNTER — Other Ambulatory Visit: Payer: Self-pay

## 2021-07-08 ENCOUNTER — Encounter: Payer: Self-pay | Admitting: Nurse Practitioner

## 2021-07-08 ENCOUNTER — Ambulatory Visit (INDEPENDENT_AMBULATORY_CARE_PROVIDER_SITE_OTHER): Payer: BC Managed Care – PPO | Admitting: Nurse Practitioner

## 2021-07-08 VITALS — BP 147/83 | HR 74 | Temp 98.4°F | Resp 16 | Ht 74.0 in | Wt 214.6 lb

## 2021-07-08 DIAGNOSIS — R1010 Upper abdominal pain, unspecified: Secondary | ICD-10-CM

## 2021-07-08 DIAGNOSIS — I447 Left bundle-branch block, unspecified: Secondary | ICD-10-CM

## 2021-07-08 DIAGNOSIS — E1169 Type 2 diabetes mellitus with other specified complication: Secondary | ICD-10-CM | POA: Diagnosis not present

## 2021-07-08 DIAGNOSIS — E785 Hyperlipidemia, unspecified: Secondary | ICD-10-CM

## 2021-07-08 DIAGNOSIS — Z125 Encounter for screening for malignant neoplasm of prostate: Secondary | ICD-10-CM

## 2021-07-08 DIAGNOSIS — R300 Dysuria: Secondary | ICD-10-CM

## 2021-07-08 DIAGNOSIS — Z0001 Encounter for general adult medical examination with abnormal findings: Secondary | ICD-10-CM | POA: Diagnosis not present

## 2021-07-08 DIAGNOSIS — E559 Vitamin D deficiency, unspecified: Secondary | ICD-10-CM

## 2021-07-08 DIAGNOSIS — E1165 Type 2 diabetes mellitus with hyperglycemia: Secondary | ICD-10-CM | POA: Diagnosis not present

## 2021-07-08 DIAGNOSIS — K219 Gastro-esophageal reflux disease without esophagitis: Secondary | ICD-10-CM

## 2021-07-08 DIAGNOSIS — Z794 Long term (current) use of insulin: Secondary | ICD-10-CM

## 2021-07-08 NOTE — Progress Notes (Signed)
[facility] [place], [place] [address]  Internal MEDICINE  Office Visit Note  Patient Name: Gabriel Burton  [id number]  [id number]  Date of Service: 07/08/2021  Chief Complaint  Patient presents with   Medicare Wellness    Stomach hurts about 20 minutes after eating and then pt has diarrhea, started about a week or two ago   Diabetes   Hyperlipidemia   Hypertension    HPI Gabriel Burton presents for an annual well visit and physical exam.  He is a well-appearing 70 year old male.  He continues to have GI issues.  He reports that his stomach hurts about 20 minutes after eating and then he also has diarrhea he reports that this started about a week or 2 ago.  He was also having this issue at his last office visit.  H. pylori breath test was done and it was negative.  He denies nausea or vomiting, no history of diverticulosis, denies constipation.  He reports that he feels bloated and has abdominal distention, feels tight. He has chronic medical problems including diabetes, hypertension and hyperlipidemia.  He is due to have his A1c checked in January.  He is due for routine labs.  He had his diabetic eye exam in May.  He is due for a diabetic foot exam today.  He will be due for routine colonoscopy in April next year.    Current Medication: Outpatient Encounter Medications as of 07/08/2021  Medication Sig   amLODipine (NORVASC) 10 MG tablet Take 1 tablet (10 mg total) by mouth daily.   aspirin EC 81 MG tablet Take 81 mg by mouth daily.    atorvastatin (LIPITOR) 10 MG tablet Take 1 tablet (10 mg total) by mouth at bedtime.   carvedilol (COREG) 12.5 MG tablet Take 1.5 tablets (18.75 mg total) by mouth 2 (two) times daily with a meal.   Cholecalciferol (VITAMIN D3) 5000 units TABS Take 5,000 Units by mouth daily.    CINNAMON PO Take by mouth daily.    fluticasone (FLONASE) 50 MCG/ACT nasal spray Place 2 sprays into both nostrils daily.   gabapentin (NEURONTIN) 100 MG  capsule Take 1 capsule (100 mg total) by mouth daily.   glucose blood test strip Check blood sugars TID   Insulin Pen Needle (BD PEN NEEDLE NANO U/F) 32G X 4 MM MISC USE AS DIRECTED  TWICE  A DAILY DIAG E11.65   liraglutide (VICTOZA) 18 MG/3ML SOPN INJECT 1.8 MG UNDER THE SKIN DAILY AS DIRECTED   lisinopril-hydrochlorothiazide (ZESTORETIC) 20-12.5 MG tablet TAKE 2 TABLETS BY MOUTH ONCE DAILY FOR HIGH BLOOD PRESSURE   methocarbamol (ROBAXIN) 500 MG tablet Take 1 tablet (500 mg total) by mouth 3 (three) times daily.   nitroGLYCERIN (NITROSTAT) 0.4 MG SL tablet DISSOLVE ONE TABLET UNDER THE TONGUE EVERY 5 MINUTES AS NEEDED FOR CHEST PAIN.  DO NOT EXCEED A TOTAL OF 3 DOSES IN 15 MINUTES   pantoprazole (PROTONIX) 40 MG tablet Take 1 tablet (40 mg total) by mouth daily.   spironolactone (ALDACTONE) 25 MG tablet Take 1 tablet by mouth once daily   tadalafil (CIALIS) 20 MG tablet Take 1 tablet (20 mg total) by mouth daily as needed for erectile dysfunction.   tamsulosin (FLOMAX) 0.4 MG CAPS capsule TAKE 1 CAPSULE BY MOUTH ONE-HALF HOUR AFTER THE SAME MEAL EACH DAY   TOUJEO SOLOSTAR 300 UNIT/ML Solostar Pen Inject 40 units [place] daily. May increase up to 45 units as indicated.   vitamin C (ASCORBIC ACID) 500 MG  tablet Take 500 mg by mouth daily.    No facility-administered encounter medications on file as of 07/08/2021.    Surgical History: History reviewed. No pertinent surgical history.  Medical History: Past Medical History:  Diagnosis Date   Diabetes mellitus without complication (HCC)    Diastolic dysfunction    a. 09/2018 Echo: EF 63%, diast dysfxn. Mild conc LVH. Mild TR.   History of stress test    a. 09/2018 ETT: Ex time 3:50. BP 207/90. Inconclusive 2/2 LBBB.  Stopped 2/2 claudication; b. 12/2018 Lexi MV: fixed septal/apical defect - felt to be artifact 2/2 LBBB. Very mild cor Ca2+ and Ao atherosclerosis. Low-mod risk study. EF 43%.   Hyperlipidemia    Hypertension    a. 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   Left bundle branch block     Family History: Family History  Problem Relation Age of Onset   Hypertension Mother    Hypertension Father    Heart attack Brother 60    Social History   Socioeconomic History   Marital status: Widowed    Spouse name: Not on file   Number of children: Not on file   Years of education: Not on file   Highest education level: Not on file  Occupational History   Not on file  Tobacco Use   Smoking status: Former    Packs/day: 0.40    Years: 8.00    Pack years: 3.20    Types: Cigarettes    Quit date: 2000    Years since quitting: 23.0   Smokeless tobacco: Never  Vaping Use   Vaping Use: Never used  Substance and Sexual Activity   Alcohol use: Never   Drug use: Never   Sexual activity: Not on file  Other Topics Concern   Not on file  Social History Narrative   Not on file   Social Determinants of Health   Financial Resource Strain: Not on file  Food Insecurity: Not on file  Transportation Needs: Not on file  Physical Activity: Not on file  Stress: Not on file  Social Connections: Not on file  Intimate Partner Violence: Not on file      Review of Systems  Constitutional:  Negative for activity change, appetite change, chills, fatigue, fever and unexpected weight change.  HENT: Negative.  Negative for congestion, ear pain, rhinorrhea, sore throat and trouble swallowing.   Eyes: Negative.   Respiratory: Negative.  Negative for cough, chest tightness, shortness of breath and wheezing.   Cardiovascular: Negative.  Negative for chest pain.  Gastrointestinal:  Positive for abdominal distention, abdominal pain and diarrhea. Negative for blood in stool, constipation, nausea and vomiting.  Endocrine: Negative.   Genitourinary: Negative.  Negative for difficulty urinating, dysuria, frequency, hematuria and urgency.  Musculoskeletal: Negative.  Negative for arthralgias, back pain, joint swelling, myalgias and neck  pain.  Skin: Negative.  Negative for rash and wound.  Allergic/Immunologic: Negative.  Negative for immunocompromised state.  Neurological: Negative.  Negative for dizziness, seizures, numbness and headaches.  Hematological: Negative.   Psychiatric/Behavioral: Negative.  Negative for behavioral problems, self-injury and suicidal ideas. The patient is not nervous/anxious.    Vital Signs: BP (!) 147/83    Pulse 74    Temp 98.4 F (36.9 C)    Resp 16    Ht $R'6\' 2"'VG$  (1.88 m)    Wt 214 lb 9.6 oz (97.3 kg)    SpO2 97%    BMI 27.55 kg/m    Physical  Exam Vitals reviewed.  Constitutional:      General: He is awake. He is not in acute distress.    Appearance: Normal appearance. He is well-developed, well-groomed and overweight. He is not ill-appearing or diaphoretic.  HENT:     Head: Normocephalic and atraumatic.     Right Ear: Tympanic membrane, ear canal and external ear normal.     Left Ear: Tympanic membrane, ear canal and external ear normal.     Nose: Nose normal. No congestion or rhinorrhea.     Mouth/Throat:     Lips: Pink.     Mouth: Mucous membranes are moist.     Pharynx: Oropharynx is clear. Uvula midline. No oropharyngeal exudate or posterior oropharyngeal erythema.  Eyes:     General: Lids are normal. Vision grossly intact. Gaze aligned appropriately. No scleral icterus.       Right eye: No discharge.        Left eye: No discharge.     Extraocular Movements: Extraocular movements intact.     Conjunctiva/sclera: Conjunctivae normal.     Pupils: Pupils are equal, round, and reactive to light.     Funduscopic exam:    Right eye: Red reflex present.        Left eye: Red reflex present. Neck:     Thyroid: No thyromegaly.     Vascular: No JVD.     Trachea: Trachea and phonation normal. No tracheal deviation.  Cardiovascular:     Rate and Rhythm: Normal rate and regular rhythm.     Pulses: Normal pulses.     Heart sounds: Normal heart sounds, S1 normal and S2 normal. No murmur  heard.   No friction rub. No gallop.  Pulmonary:     Effort: Pulmonary effort is normal. No accessory muscle usage or respiratory distress.     Breath sounds: Normal breath sounds and air entry. No stridor. No wheezing or rales.  Chest:     Chest wall: No tenderness.  Abdominal:     General: Bowel sounds are normal. There is no distension.     Palpations: Abdomen is soft. There is no shifting dullness, fluid wave, mass or pulsatile mass.     Tenderness: There is no abdominal tenderness. There is no guarding or rebound.  Musculoskeletal:        General: No tenderness or deformity. Normal range of motion.     Cervical back: Normal range of motion and neck supple.     Right lower leg: No edema.     Left lower leg: No edema.  Feet:     Right foot:     Protective Sensation: 6 sites tested.  6 sites sensed.     Skin integrity: No skin breakdown, erythema, warmth, callus or dry skin.     Toenail Condition: Right toenails are abnormally thick and long. Fungal disease present.    Left foot:     Protective Sensation: 6 sites tested.  6 sites sensed.     Skin integrity: No erythema, warmth, callus or dry skin.     Toenail Condition: Left toenails are abnormally thick and long. Fungal disease present. Lymphadenopathy:     Cervical: No cervical adenopathy.  Skin:    General: Skin is warm and dry.     Capillary Refill: Capillary refill takes less than 2 seconds.     Coloration: Skin is not pale.     Findings: No erythema or rash.  Neurological:     Mental Status: He is alert and oriented  to person, place, and time.     Cranial Nerves: No cranial nerve deficit.     Motor: No abnormal muscle tone.     Coordination: Coordination normal.     Gait: Gait normal.     Deep Tendon Reflexes: Reflexes are normal and symmetric.  Psychiatric:        Mood and Affect: Mood normal.        Behavior: Behavior normal. Behavior is cooperative.        Thought Content: Thought content normal.        Judgment:  Judgment normal.       Assessment/Plan: 1. Encounter for general adult medical examination with abnormal findings Age-appropriate preventive screenings and vaccinations discussed, annual physical exam completed. Routine labs for health maintenance ordered, see below. PHM updated.   2. Pain of upper abdomen Labs ordered as well as ultrasound to evaluate for possible causes of upper abdominal pain including problems with gallbladder and pancreas - Lipase - Amylase - B12 and Folate Panel - Iron, TIBC and Ferritin Panel - US Abdomen Limited RUQ (LIVER/GB); Future  3. LBBB (left bundle branch block) Routine lab ordered, patient sees cardiology - CBC with Differential/Platelet  4. Type 2 diabetes mellitus with hyperglycemia, with long-term current use of insulin (HCC) Routine lab ordered, - CMP14+EGFR  5. Hyperlipidemia associated with type 2 diabetes mellitus ([place]) Routine lab ordered, patient on statin therapy - Lipid Profile  6. Gastroesophageal reflux disease without esophagitis Stable with current medication  7. Vitamin D deficiency Routine lab ordered - Vitamin D (25 hydroxy)  8. Dysuria Routine urinalysis done - UA/M w/rflx Culture, Routine - Microscopic Examination - Urine Culture, Reflex  9. Encounter for prostate cancer screening Routine lab ordered - PSA Total (Reflex To Free)     General Counseling: Kylee verbalizes understanding of the findings of todays visit and agrees with plan of treatment. I have discussed any further diagnostic evaluation that may be needed or ordered today. We also reviewed his medications today. he has been encouraged to call the office with any questions or concerns that should arise related to todays visit.    Orders Placed This Encounter  Procedures   Microscopic Examination   Urine Culture, Reflex   US Abdomen Limited RUQ (LIVER/GB)   Lipid Profile   CMP14+EGFR   CBC with Differential/Platelet   Vitamin D (25 hydroxy)    PSA Total (Reflex To Free)   Lipase   Amylase   B12 and Folate Panel   Iron, TIBC and Ferritin Panel   UA/M w/rflx Culture, Routine    No orders of the defined types were placed in this encounter.   Return for F/U, Review labs/test, U/S @ [facility] at previously scheduled appt on 07/29/21.   Total time spent:30 Minutes Time spent includes review of chart, medications, test results, and follow up plan with the patient.   [place] Controlled Substance Database was reviewed by me.  This patient was seen by Jonetta Osgood, FNP-C in collaboration with Dr. Clayborn Bigness as a part of collaborative care agreement.  Jeffery Bachmeier R. Valetta Fuller, MSN, FNP-C Internal medicine

## 2021-07-12 LAB — UA/M W/RFLX CULTURE, ROUTINE
Bilirubin, UA: NEGATIVE
Glucose, UA: NEGATIVE
Ketones, UA: NEGATIVE
Nitrite, UA: NEGATIVE
Protein,UA: NEGATIVE
RBC, UA: NEGATIVE
Specific Gravity, UA: 1.013 (ref 1.005–1.030)
Urobilinogen, Ur: 0.2 mg/dL (ref 0.2–1.0)
pH, UA: 7 (ref 5.0–7.5)

## 2021-07-12 LAB — MICROSCOPIC EXAMINATION
Bacteria, UA: NONE SEEN
Casts: NONE SEEN /lpf
Epithelial Cells (non renal): NONE SEEN /hpf (ref 0–10)
RBC, Urine: NONE SEEN /hpf (ref 0–2)

## 2021-07-12 LAB — URINE CULTURE, REFLEX: Organism ID, Bacteria: NO GROWTH

## 2021-07-13 ENCOUNTER — Other Ambulatory Visit: Payer: BC Managed Care – PPO

## 2021-07-14 ENCOUNTER — Encounter: Payer: Self-pay | Admitting: Nurse Practitioner

## 2021-07-26 LAB — CBC WITH DIFFERENTIAL/PLATELET
Basophils Absolute: 0 10*3/uL (ref 0.0–0.2)
Basos: 1 %
EOS (ABSOLUTE): 0.2 10*3/uL (ref 0.0–0.4)
Eos: 4 %
Hematocrit: 37.5 % (ref 37.5–51.0)
Hemoglobin: 12.6 g/dL — ABNORMAL LOW (ref 13.0–17.7)
Immature Grans (Abs): 0 10*3/uL (ref 0.0–0.1)
Immature Granulocytes: 0 %
Lymphocytes Absolute: 2.1 10*3/uL (ref 0.7–3.1)
Lymphs: 37 %
MCH: 29 pg (ref 26.6–33.0)
MCHC: 33.6 g/dL (ref 31.5–35.7)
MCV: 86 fL (ref 79–97)
Monocytes Absolute: 0.5 10*3/uL (ref 0.1–0.9)
Monocytes: 9 %
Neutrophils Absolute: 2.8 10*3/uL (ref 1.4–7.0)
Neutrophils: 49 %
Platelets: 192 10*3/uL (ref 150–450)
RBC: 4.34 x10E6/uL (ref 4.14–5.80)
RDW: 12.3 % (ref 11.6–15.4)
WBC: 5.8 10*3/uL (ref 3.4–10.8)

## 2021-07-26 LAB — B12 AND FOLATE PANEL
Folate: 16 ng/mL (ref >3.0)
Vitamin B-12: 527 pg/mL (ref 232–1245)

## 2021-07-26 LAB — CMP14+EGFR
ALT: 31 IU/L (ref 0–44)
AST: 29 IU/L (ref 0–40)
Albumin/Globulin Ratio: 1.7 (ref 1.2–2.2)
Albumin: 4.4 g/dL (ref 3.8–4.8)
Alkaline Phosphatase: 133 IU/L — ABNORMAL HIGH (ref 44–121)
BUN/Creatinine Ratio: 14 (ref 10–24)
BUN: 21 mg/dL (ref 8–27)
Bilirubin Total: 0.3 mg/dL (ref 0.0–1.2)
CO2: 24 mmol/L (ref 20–29)
Calcium: 9.5 mg/dL (ref 8.6–10.2)
Chloride: 102 mmol/L (ref 96–106)
Creatinine, Ser: 1.5 mg/dL — ABNORMAL HIGH (ref 0.76–1.27)
Globulin, Total: 2.6 g/dL (ref 1.5–4.5)
Glucose: 133 mg/dL — ABNORMAL HIGH (ref 70–99)
Potassium: 4.3 mmol/L (ref 3.5–5.2)
Sodium: 141 mmol/L (ref 134–144)
Total Protein: 7 g/dL (ref 6.0–8.5)
eGFR: 50 mL/min/{1.73_m2} — ABNORMAL LOW (ref >59)

## 2021-07-26 LAB — IRON,TIBC AND FERRITIN PANEL
Ferritin: 208 ng/mL (ref 30–400)
Iron Saturation: 38 % (ref 15–55)
Iron: 116 ug/dL (ref 38–169)
Total Iron Binding Capacity: 304 ug/dL (ref 250–450)
UIBC: 188 ug/dL (ref 111–343)

## 2021-07-26 LAB — LIPID PANEL
Chol/HDL Ratio: 2.6 ratio (ref 0.0–5.0)
Cholesterol, Total: 150 mg/dL (ref 100–199)
HDL: 57 mg/dL (ref >39)
LDL Chol Calc (NIH): 75 mg/dL (ref 0–99)
Triglycerides: 100 mg/dL (ref 0–149)
VLDL Cholesterol Cal: 18 mg/dL (ref 5–40)

## 2021-07-26 LAB — PSA TOTAL (REFLEX TO FREE): Prostate Specific Ag, Serum: 1.2 ng/mL (ref 0.0–4.0)

## 2021-07-26 LAB — LIPASE: Lipase: 40 U/L (ref 13–78)

## 2021-07-26 LAB — VITAMIN D 25 HYDROXY (VIT D DEFICIENCY, FRACTURES): Vit D, 25-Hydroxy: 48.9 ng/mL (ref 30.0–100.0)

## 2021-07-26 LAB — AMYLASE: Amylase: 95 U/L (ref 31–110)

## 2021-07-27 ENCOUNTER — Ambulatory Visit (INDEPENDENT_AMBULATORY_CARE_PROVIDER_SITE_OTHER): Payer: BC Managed Care – PPO

## 2021-07-27 ENCOUNTER — Other Ambulatory Visit: Payer: Self-pay

## 2021-07-27 DIAGNOSIS — R1010 Upper abdominal pain, unspecified: Secondary | ICD-10-CM

## 2021-07-27 NOTE — Progress Notes (Signed)
I have reviewed the lab results. There are no critically abnormal values requiring immediate intervention but there are some abnormals that will be discussed at the next office visit.  

## 2021-07-29 ENCOUNTER — Ambulatory Visit: Payer: BC Managed Care – PPO | Admitting: Nurse Practitioner

## 2021-08-04 ENCOUNTER — Ambulatory Visit: Payer: BC Managed Care – PPO | Admitting: Nurse Practitioner

## 2021-08-04 ENCOUNTER — Other Ambulatory Visit: Payer: Self-pay

## 2021-08-04 ENCOUNTER — Encounter: Payer: Self-pay | Admitting: Nurse Practitioner

## 2021-08-04 VITALS — BP 146/78 | HR 73 | Temp 98.1°F | Resp 16 | Ht 74.0 in | Wt 215.4 lb

## 2021-08-04 DIAGNOSIS — Z794 Long term (current) use of insulin: Secondary | ICD-10-CM

## 2021-08-04 DIAGNOSIS — E1165 Type 2 diabetes mellitus with hyperglycemia: Secondary | ICD-10-CM

## 2021-08-04 DIAGNOSIS — R160 Hepatomegaly, not elsewhere classified: Secondary | ICD-10-CM

## 2021-08-04 DIAGNOSIS — N1831 Chronic kidney disease, stage 3a: Secondary | ICD-10-CM

## 2021-08-04 DIAGNOSIS — E1122 Type 2 diabetes mellitus with diabetic chronic kidney disease: Secondary | ICD-10-CM

## 2021-08-04 DIAGNOSIS — R1010 Upper abdominal pain, unspecified: Secondary | ICD-10-CM

## 2021-08-04 LAB — POCT GLYCOSYLATED HEMOGLOBIN (HGB A1C): Hemoglobin A1C: 7.7 % — AB (ref 4.0–5.6)

## 2021-08-04 MED ORDER — SEMAGLUTIDE(0.25 OR 0.5MG/DOS) 2 MG/1.5ML ~~LOC~~ SOPN: 0.2500 mg | PEN_INJECTOR | SUBCUTANEOUS | 3 refills | Status: DC

## 2021-08-04 NOTE — Progress Notes (Signed)
[facility] [address] [place], [place] [address]  Internal MEDICINE  Office Visit Note  Patient Name: Gabriel Burton  [id number]  [id number]  Date of Service: 08/04/2021  Chief Complaint  Patient presents with   Diabetes   Results    Labs and US    HPI Gabriel BerkshireJohnny presents for a follow up visit to discuss lab results and ultrasound and he is due to have his A1C repeated. At a previous office visit, he was having abdominal pain mostly in the RUQ so an abdominal ultrasound of the RUQ was done. The ultrasound showed hepatic steatosis and enlargement of the liver. His liver enzymes were normal. :Pancreatic enzymes were also checked and his lipase and amylase levels were normal.  His PSA was normal. Vitamin D, B12, folate, iron panel and lipids were also normal. His CBC showed a slightly low hemoglobin but all other values were normal. His metabolic panel remains stable with his glucose level consistently elevated,  creatinine level is elevated but consistent with previous values. His GFR is 50 which is decreased but no significant change from 6 months ago. His sodium level came back down to normal and his alkaline phosphatase remains persistently elevated. AST and ALT are normal.  He has diabetes type 2 and is taking victoza injections daily. He is interested in switching to a different medication because the price of victoza has gone up with his insurance this year. His A1C was 7.0 in October and has increased to 7.7 today. He is interested in trying ozempic or trulicity instead of victoza.     Current Medication: Outpatient Encounter Medications as of 08/04/2021  Medication Sig   amLODipine (NORVASC) 10 MG tablet Take 1 tablet (10 mg total) by mouth daily.   aspirin EC 81 MG tablet Take 81 mg by mouth daily.    atorvastatin (LIPITOR) 10 MG tablet Take 1 tablet (10 mg total) by mouth at bedtime.   carvedilol (COREG) 12.5 MG tablet Take 1.5 tablets (18.75 mg total) by mouth 2 (two)  times daily with a meal.   Cholecalciferol (VITAMIN D3) 5000 units TABS Take 5,000 Units by mouth daily.    CINNAMON PO Take by mouth daily.    fluticasone (FLONASE) 50 MCG/ACT nasal spray Place 2 sprays into both nostrils daily.   gabapentin (NEURONTIN) 100 MG capsule Take 1 capsule (100 mg total) by mouth daily.   glucose blood test strip Check blood sugars TID   Insulin Pen Needle (BD PEN NEEDLE NANO U/F) 32G X 4 MM MISC USE AS DIRECTED  TWICE  A DAILY DIAG E11.65   lisinopril-hydrochlorothiazide (ZESTORETIC) 20-12.5 MG tablet TAKE 2 TABLETS BY MOUTH ONCE DAILY FOR HIGH BLOOD PRESSURE   methocarbamol (ROBAXIN) 500 MG tablet Take 1 tablet (500 mg total) by mouth 3 (three) times daily.   nitroGLYCERIN (NITROSTAT) 0.4 MG SL tablet DISSOLVE ONE TABLET UNDER THE TONGUE EVERY 5 MINUTES AS NEEDED FOR CHEST PAIN.  DO NOT EXCEED A TOTAL OF 3 DOSES IN 15 MINUTES   pantoprazole (PROTONIX) 40 MG tablet Take 1 tablet (40 mg total) by mouth daily.   spironolactone (ALDACTONE) 25 MG tablet Take 1 tablet by mouth once daily   tadalafil (CIALIS) 20 MG tablet Take 1 tablet (20 mg total) by mouth daily as needed for erectile dysfunction.   tamsulosin (FLOMAX) 0.4 MG CAPS capsule TAKE 1 CAPSULE BY MOUTH ONE-HALF HOUR AFTER THE SAME MEAL EACH DAY   TOUJEO SOLOSTAR 300 UNIT/ML Solostar Pen Inject 40 units [place]  daily. May increase up to 45 units as indicated.   vitamin C (ASCORBIC ACID) 500 MG tablet Take 500 mg by mouth daily.    [DISCONTINUED] liraglutide (VICTOZA) 18 MG/3ML SOPN INJECT 1.8 MG UNDER THE SKIN DAILY AS DIRECTED   [DISCONTINUED] Semaglutide,0.25 or 0.5MG /DOS, 2 MG/1.5ML SOPN Inject 0.25 mg into the skin once a week. After the 1st 4 doses, increase to 0.5 mg weekly.   Semaglutide,0.25 or 0.5MG /DOS, 2 MG/1.5ML SOPN Inject 0.25 mg into the skin once a week. After the 1st 4 doses, increase to 0.5 mg weekly.   No facility-administered encounter medications on file as of 08/04/2021.    Surgical  History: History reviewed. No pertinent surgical history.  Medical History: Past Medical History:  Diagnosis Date   Diabetes mellitus without complication (HCC)    Diastolic dysfunction    a. 09/2018 Echo: EF 63%, diast dysfxn. Mild conc LVH. Mild TR.   History of stress test    a. 09/2018 ETT: Ex time 3:50. BP 207/90. Inconclusive 2/2 LBBB.  Stopped 2/2 claudication; b. 12/2018 Lexi MV: fixed septal/apical defect - felt to be artifact 2/2 LBBB. Very mild cor Ca2+ and Ao atherosclerosis. Low-mod risk study. EF 43%.   Hyperlipidemia    Hypertension    a. 08/2020 Renal artery duplex: No evidence of RAS.   Left bundle branch block     Family History: Family History  Problem Relation Age of Onset   Hypertension Mother    Hypertension Father    Heart attack Brother 7    Social History   Socioeconomic History   Marital status: Widowed    Spouse name: Not on file   Number of children: Not on file   Years of education: Not on file   Highest education level: Not on file  Occupational History   Not on file  Tobacco Use   Smoking status: Former    Packs/day: 0.40    Years: 8.00    Pack years: 3.20    Types: Cigarettes    Quit date: 2000    Years since quitting: 23.0   Smokeless tobacco: Never  Vaping Use   Vaping Use: Never used  Substance and Sexual Activity   Alcohol use: Never   Drug use: Never   Sexual activity: Not on file  Other Topics Concern   Not on file  Social History Narrative   Not on file   Social Determinants of Health   Financial Resource Strain: Not on file  Food Insecurity: Not on file  Transportation Needs: Not on file  Physical Activity: Not on file  Stress: Not on file  Social Connections: Not on file  Intimate Partner Violence: Not on file      Review of Systems  Constitutional:  Negative for chills, fatigue and unexpected weight change.  HENT:  Negative for congestion, rhinorrhea, sneezing and sore throat.   Eyes:  Negative for redness.   Respiratory:  Negative for cough, chest tightness and shortness of breath.   Cardiovascular:  Negative for chest pain and palpitations.  Gastrointestinal:  Negative for abdominal pain, constipation, diarrhea, nausea and vomiting.  Genitourinary:  Negative for dysuria and frequency.  Musculoskeletal:  Negative for arthralgias, back pain, joint swelling and neck pain.  Skin:  Negative for rash.  Neurological: Negative.  Negative for tremors and numbness.  Hematological:  Negative for adenopathy. Does not bruise/bleed easily.  Psychiatric/Behavioral:  Negative for behavioral problems (Depression), sleep disturbance and suicidal ideas. The patient is not nervous/anxious.  Vital Signs: BP (!) 146/78    Pulse 73    Temp 98.1 F (36.7 C)    Resp 16    Ht 6\' 2"  (1.88 m)    Wt 215 lb 6.4 oz (97.7 kg)    SpO2 99%    BMI 27.66 kg/m    Physical Exam Vitals reviewed.  Constitutional:      General: He is not in acute distress.    Appearance: Normal appearance. He is obese. He is not ill-appearing.  HENT:     Head: Normocephalic and atraumatic.  Eyes:     Pupils: Pupils are equal, round, and reactive to light.  Cardiovascular:     Rate and Rhythm: Normal rate and regular rhythm.  Pulmonary:     Effort: Pulmonary effort is normal. No respiratory distress.  Neurological:     Mental Status: He is alert and oriented to person, place, and time.     Cranial Nerves: No cranial nerve deficit.     Coordination: Coordination normal.     Gait: Gait normal.  Psychiatric:        Mood and Affect: Mood normal.        Behavior: Behavior normal.       Assessment/Plan: 1. Type 2 diabetes mellitus with stage 3a chronic kidney disease, with long-term current use of insulin (HCC) A1C remains elevated at 7.7. switching from victoza to ozempic. He has 1 month left of victoza and would like to use that up first then will start ozempic and increase dose to 0.5 mg weekly after the first 4 weeks of ozempic.  Follow up in 3 months for repeat A1C. - POCT HgB A1C - Semaglutide,0.25 or 0.5MG /DOS, 2 MG/1.5ML SOPN; Inject 0.25 mg into the skin once a week. After the 1st 4 doses, increase to 0.5 mg weekly.  Dispense: 1.5 mL; Refill: 3  2. Hepatomegaly Found on ultrasound. Will monitor annually with ultrasound and repeat liver function testing annually as well  3. Pain of upper abdomen Resolved.    General Counseling: Maleik verbalizes understanding of the findings of todays visit and agrees with plan of treatment. I have discussed any further diagnostic evaluation that may be needed or ordered today. We also reviewed his medications today. he has been encouraged to call the office with any questions or concerns that should arise related to todays visit.    Orders Placed This Encounter  Procedures   POCT HgB A1C    Meds ordered this encounter  Medications   DISCONTD: Semaglutide,0.25 or 0.5MG /DOS, 2 MG/1.5ML SOPN    Sig: Inject 0.25 mg into the skin once a week. After the 1st 4 doses, increase to 0.5 mg weekly.    Dispense:  1.5 mL    Refill:  3   Semaglutide,0.25 or 0.5MG /DOS, 2 MG/1.5ML SOPN    Sig: Inject 0.25 mg into the skin once a week. After the 1st 4 doses, increase to 0.5 mg weekly.    Dispense:  1.5 mL    Refill:  3    Discontinue victoza, patient is switching to ozempic, disregard all previous orders and use this order    Return in about 3 months (around 11/02/2021) for F/U, Recheck A1C, Ramonita Koenig PCP.   Total time spent:30 Minutes Time spent includes review of chart, medications, test results, and follow up plan with the patient.   [place] Controlled Substance Database was reviewed by me.  This patient was seen by Sallyanne KusterAlyssa Alexandre Faries, FNP-C in collaboration with Dr. Beverely RisenFozia Khan as a part  of collaborative care agreement.   Nikitia Asbill R. Tedd Sias, MSN, FNP-C Internal medicine

## 2021-08-05 ENCOUNTER — Encounter: Payer: Self-pay | Admitting: Nurse Practitioner

## 2021-08-05 DIAGNOSIS — R160 Hepatomegaly, not elsewhere classified: Secondary | ICD-10-CM | POA: Insufficient documentation

## 2021-08-10 ENCOUNTER — Ambulatory Visit: Payer: BC Managed Care – PPO | Admitting: Internal Medicine

## 2021-08-25 ENCOUNTER — Other Ambulatory Visit: Payer: Self-pay

## 2021-08-25 DIAGNOSIS — J300 Vasomotor rhinitis: Secondary | ICD-10-CM

## 2021-08-25 MED ORDER — AMLODIPINE BESYLATE 10 MG PO TABS: 10.0000 mg | ORAL_TABLET | Freq: Every day | ORAL | 5 refills | Status: DC

## 2021-08-25 MED ORDER — FLUTICASONE PROPIONATE 50 MCG/ACT NA SUSP: 2.0000 | Freq: Every day | NASAL | 6 refills | Status: AC

## 2021-09-06 ENCOUNTER — Other Ambulatory Visit: Payer: Self-pay | Admitting: Internal Medicine

## 2021-09-06 ENCOUNTER — Encounter: Payer: Self-pay | Admitting: Medical

## 2021-09-06 ENCOUNTER — Other Ambulatory Visit: Payer: Self-pay

## 2021-09-06 ENCOUNTER — Ambulatory Visit: Payer: BC Managed Care – PPO | Admitting: Medical

## 2021-09-06 VITALS — BP 120/72 | HR 76 | Ht 74.0 in | Wt 215.0 lb

## 2021-09-06 DIAGNOSIS — I2584 Coronary atherosclerosis due to calcified coronary lesion: Secondary | ICD-10-CM

## 2021-09-06 DIAGNOSIS — I2510 Atherosclerotic heart disease of native coronary artery without angina pectoris: Secondary | ICD-10-CM

## 2021-09-06 DIAGNOSIS — I10 Essential (primary) hypertension: Secondary | ICD-10-CM

## 2021-09-06 DIAGNOSIS — E1140 Type 2 diabetes mellitus with diabetic neuropathy, unspecified: Secondary | ICD-10-CM

## 2021-09-06 DIAGNOSIS — R079 Chest pain, unspecified: Secondary | ICD-10-CM | POA: Diagnosis not present

## 2021-09-06 DIAGNOSIS — E782 Mixed hyperlipidemia: Secondary | ICD-10-CM

## 2021-09-06 DIAGNOSIS — I878 Other specified disorders of veins: Secondary | ICD-10-CM

## 2021-09-06 DIAGNOSIS — Z794 Long term (current) use of insulin: Secondary | ICD-10-CM

## 2021-09-06 NOTE — Patient Instructions (Signed)
Medication Instructions:  ° °Your physician recommends that you continue on your current medications as directed. Please refer to the Current Medication list given to you today.  ° °*If you need a refill on your cardiac medications before your next appointment, please call your pharmacy* ° ° °Lab Work: °None ordered ° °If you have labs (blood work) drawn today and your tests are completely normal, you will receive your results only by: °MyChart Message (if you have MyChart) OR °A paper copy in the mail °If you have any lab test that is abnormal or we need to change your treatment, we will call you to review the results. ° ° °Testing/Procedures: °None ordered ° ° °Follow-Up: °At [facility], you and your health needs are our priority.  As part of our continuing mission to provide you with exceptional heart care, we have created designated Provider Care Teams.  These Care Teams include your primary Cardiologist (physician) and Advanced Practice Providers (APPs -  Physician Assistants and Nurse Practitioners) who all work together to provide you with the care you need, when you need it. ° °We recommend signing up for the patient portal called "MyChart".  Sign up information is provided on this After Visit Summary.  MyChart is used to connect with patients for Virtual Visits (Telemedicine).  Patients are able to view lab/test results, encounter notes, upcoming appointments, etc.  Non-urgent messages can be sent to your provider as well.   °To learn more about what you can do with MyChart, go to [email or web address].   ° °Your next appointment:   °6 month(s) ° °The format for your next appointment:   °In Person ° °Provider:   °You may see Christopher End, MD or one of the following Advanced Practice Providers on your designated Care Team:   °Christopher Berge, NP °Ryan Dunn, PA-C °Cadence Furth, PA-C ° ° °Other Instructions °N/A °

## 2021-09-06 NOTE — Progress Notes (Signed)
Cardiology Office Note:    Date:  09/06/2021   ID:  Gabriel Burton, DOB February 05, 1951, MRN [id number]  PCP:  Lavera Guise, MD  [facility] Cardiologist:  [name], MD  [facility] Electrophysiologist:  None   Referring MD: Lavera Guise, MD   Chief Complaint: 4 month follow-up  History of Present Illness:    Gabriel Burton is a 71 y.o. male with a hx of with history of hypertension, hyperlipidemia, type 2 diabetes mellitus, and left bundle branch block, who presents for follow-up related to hypertension.  He was previously evaluated with a treadmill stress test in March 2020, at which time he walked for 3 minutes and 50 seconds and had a hypertensive response with a blood pressure of 207/90.  The test was stopped secondary to lower extremity claudication, and was inconclusive secondary to left bundle branch block.  Echocardiogram in March 2020 showed an EF of [id number], with diastolic dysfunction, mild LVH, and mild TR.  He subsequently underwent Lexiscan Myoview in June 2020 showing a fixed septal/apical defect with an EF recorded at 43%.  This was felt to be artifactual secondary to left bundle branch block.  Mild coronary calcium and aortic atherosclerosis were noted, and medical therapy was recommended.  In the setting of resistant hypertension, he underwent renal arterial duplex in February 2022 which did not show any evidence of renal artery stenosis.  The patient was last seen 03/2021 and had elevated BP with intermittent chest pain. Coreg was increased.   Today, the patient reports he has been doing well. Reports occasional chest discomfort, suspects indigestion, occurs very rarely. No SOB, LLE, orthopnea, pnd, dizziness, lightheadedness, or palpitations. Patient is very active, no chest pain with this. Diet is fair.   Past Medical History:  Diagnosis Date   Diabetes mellitus without complication (HCC)    Diastolic dysfunction    a. 09/2018 Echo: EF 63%, diast dysfxn. Mild  conc LVH. Mild TR.   History of stress test    a. 09/2018 ETT: Ex time 3:50. BP 207/90. Inconclusive 2/2 LBBB.  Stopped 2/2 claudication; b. 12/2018 Lexi MV: fixed septal/apical defect - felt to be artifact 2/2 LBBB. Very mild cor Ca2+ and Ao atherosclerosis. Low-mod risk study. EF 43%.   Hyperlipidemia    Hypertension    a. 08/2020 Renal artery duplex: No evidence of RAS.   Left bundle branch block     History reviewed. No pertinent surgical history.  Current Medications: Current Meds  Medication Sig   amLODipine (NORVASC) 10 MG tablet Take 1 tablet (10 mg total) by mouth daily.   aspirin EC 81 MG tablet Take 81 mg by mouth daily.    atorvastatin (LIPITOR) 10 MG tablet Take 1 tablet (10 mg total) by mouth at bedtime.   carvedilol (COREG) 12.5 MG tablet Take 1.5 tablets (18.75 mg total) by mouth 2 (two) times daily with a meal.   Cholecalciferol (VITAMIN D3) 5000 units TABS Take 5,000 Units by mouth daily.    CINNAMON PO Take by mouth daily.    fluticasone (FLONASE) 50 MCG/ACT nasal spray Place 2 sprays into both nostrils daily.   gabapentin (NEURONTIN) 100 MG capsule Take 1 capsule (100 mg total) by mouth daily.   glucose blood test strip Check blood sugars TID   Insulin Pen Needle (BD PEN NEEDLE NANO U/F) 32G X 4 MM MISC USE AS DIRECTED  TWICE  A DAILY DIAG E11.65   lisinopril-hydrochlorothiazide (ZESTORETIC) 20-12.5 MG tablet TAKE 2 TABLETS BY MOUTH  ONCE DAILY FOR HIGH BLOOD PRESSURE   methocarbamol (ROBAXIN) 500 MG tablet Take 500 mg by mouth 3 (three) times daily as needed for muscle spasms.   nitroGLYCERIN (NITROSTAT) 0.4 MG SL tablet DISSOLVE ONE TABLET UNDER THE TONGUE EVERY 5 MINUTES AS NEEDED FOR CHEST PAIN.  DO NOT EXCEED A TOTAL OF 3 DOSES IN 15 MINUTES   pantoprazole (PROTONIX) 40 MG tablet Take 1 tablet (40 mg total) by mouth daily.   Semaglutide,0.25 or 0.5MG /DOS, 2 MG/1.5ML SOPN Inject 0.25 mg into the skin once a week. After the 1st 4 doses, increase to 0.5 mg weekly.    spironolactone (ALDACTONE) 25 MG tablet Take 1 tablet by mouth once daily   tadalafil (CIALIS) 20 MG tablet Take 1 tablet (20 mg total) by mouth daily as needed for erectile dysfunction.   tamsulosin (FLOMAX) 0.4 MG CAPS capsule TAKE 1 CAPSULE BY MOUTH ONE-HALF HOUR AFTER THE SAME MEAL EACH DAY   TOUJEO SOLOSTAR 300 UNIT/ML Solostar Pen Inject 40 units Silex daily. May increase up to 45 units as indicated.   vitamin C (ASCORBIC ACID) 500 MG tablet Take 500 mg by mouth daily.      Allergies:   Patient has no known allergies.   Social History   Socioeconomic History   Marital status: Widowed    Spouse name: Not on file   Number of children: Not on file   Years of education: Not on file   Highest education level: Not on file  Occupational History   Not on file  Tobacco Use   Smoking status: Former    Packs/day: 0.40    Years: 8.00    Pack years: 3.20    Types: Cigarettes    Quit date: 2000    Years since quitting: 23.1   Smokeless tobacco: Never  Vaping Use   Vaping Use: Never used  Substance and Sexual Activity   Alcohol use: Never   Drug use: Never   Sexual activity: Not on file  Other Topics Concern   Not on file  Social History Narrative   Not on file   Social Determinants of Health   Financial Resource Strain: Not on file  Food Insecurity: Not on file  Transportation Needs: Not on file  Physical Activity: Not on file  Stress: Not on file  Social Connections: Not on file     Family History: The patient's family history includes Heart attack (age of onset: 48) in his brother; Hypertension in his father and mother.  ROS:   Please see the history of present illness.     All other systems reviewed and are negative.  EKGs/Labs/Other Studies Reviewed:    The following studies were reviewed today:    MPI 2020  Narrative & Impression  Pharmacological myocardial perfusion imaging study with no significant ischemia Fixed defect septal wall, apical region,  Hypokinesis of the septal wall, EF estimated at 43% Perfusion defect and depressed ejection fraction possibly secondary to left bundle branch block No EKG changes concerning for ischemia at peak stress or in recovery. Very mild coronary calcification and aortic atherosclerosis on CT imaging Low to moderate risk scan     Signed, Esmond Plants, MD, Ph.D [facility]   ETT 2020 Stress Findings The patient exercised following the Bruce protocol.   The patient reported shortness of breath and claudication during the stress test. The patient experienced no angina during the stress test.   The test was stopped because  the patient complained of claudication.  Blood pressure and heart rate demonstrated a normal response to exercise. Blood pressure demonstrated a normal response to exercise. Overall, the patient's exercise capacity was normal.   The patient's response to exercise was adequate for diagnosis.   EKG:  EKG is not ordered today.    Recent Labs: 07/25/2021: ALT 31; BUN 21; Creatinine, Ser 1.50; Hemoglobin 12.6; Platelets 192; Potassium 4.3; Sodium 141  Recent Lipid Panel    Component Value Date/Time   CHOL 150 07/25/2021 1509   TRIG 100 07/25/2021 1509   HDL 57 07/25/2021 1509   CHOLHDL 2.6 07/25/2021 1509   LDLCALC 75 07/25/2021 1509    Physical Exam:    VS:  BP 120/72 (BP Location: Left Arm, Patient Position: Sitting, Cuff Size: Large)    Pulse 76    Ht 6\' 2"  (1.88 m)    Wt 215 lb (97.5 kg)    SpO2 99%    BMI 27.60 kg/m     Wt Readings from Last 3 Encounters:  09/06/21 215 lb (97.5 kg)  08/04/21 215 lb 6.4 oz (97.7 kg)  07/08/21 214 lb 9.6 oz (97.3 kg)     GEN:  Well nourished, well developed in no acute distress HEENT: Normal NECK: No JVD; No carotid bruits LYMPHATICS: No lymphadenopathy CARDIAC: RRR, no murmurs, rubs, gallops RESPIRATORY:  Clear to auscultation without rales, wheezing or rhonchi  ABDOMEN: Soft, non-tender, non-distended MUSCULOSKELETAL:  No  edema; No deformity  SKIN: Warm and dry NEUROLOGIC:  Alert and oriented x 3 PSYCHIATRIC:  Normal affect   ASSESSMENT:    1. Essential hypertension   2. Chest pain, unspecified type   3. Hyperlipidemia, mixed   4. Type 2 diabetes mellitus with diabetic neuropathy, with long-term current use of insulin ([place])   5. Venous stasis   6. Coronary artery calcification    PLAN:    In order of problems listed above:  HTN BP good today. Continue amlodipine 10mg  daily, Coreg 18.75mg  BID, Zestoretic 20-12.5 mg BID, spironolactone 25mg  daily.   Chest pain Coronary calcification on CT  Patient reports rare episodes of chest discomfort, suspect it is likely from indigestion. MPI in 2020 showed no significant ischemia, mild calcification, low to moderate risk scan. Prior echo in2020 showed normal LVEF, DD. Continue Protonix for GERD. Continue Aspirin, statin, and BB  HLD LDL 75. Continue Lipitor.   DM2 Most recent A1c 7.7, which patient says is high for him. Followed by PCP.   Venous stasis He has started wearing compression socks, which has improved swelling. He is on spironolactone and HCTZ. Monitor kidney function.   CKD stage 2 Most recent Scr stable at 1.5. Continue to follow closely with labs. Patient is on lisinopril-HCTZ and spironolactone.    Disposition: Follow up in 6 month(s) with MD/APP      Signed, Damyen Knoll Ninfa Meeker, PA-C  09/06/2021 12:30 PM    [facility]

## 2021-09-22 ENCOUNTER — Other Ambulatory Visit: Payer: Self-pay | Admitting: Internal Medicine

## 2021-10-09 ENCOUNTER — Other Ambulatory Visit: Payer: Self-pay | Admitting: Nurse Practitioner

## 2021-10-09 DIAGNOSIS — I10 Essential (primary) hypertension: Secondary | ICD-10-CM

## 2021-10-16 ENCOUNTER — Other Ambulatory Visit: Payer: Self-pay | Admitting: Internal Medicine

## 2021-11-03 ENCOUNTER — Ambulatory Visit: Payer: BC Managed Care – PPO | Admitting: Nurse Practitioner

## 2021-11-07 ENCOUNTER — Ambulatory Visit: Payer: BC Managed Care – PPO

## 2021-11-07 ENCOUNTER — Encounter: Payer: Self-pay | Admitting: Nurse Practitioner

## 2021-11-07 ENCOUNTER — Telehealth (INDEPENDENT_AMBULATORY_CARE_PROVIDER_SITE_OTHER): Payer: BC Managed Care – PPO | Admitting: Nurse Practitioner

## 2021-11-07 VITALS — BP 137/77 | HR 73 | Resp 16 | Ht 74.0 in | Wt 210.0 lb

## 2021-11-07 DIAGNOSIS — U071 COVID-19: Secondary | ICD-10-CM

## 2021-11-07 DIAGNOSIS — E1122 Type 2 diabetes mellitus with diabetic chronic kidney disease: Secondary | ICD-10-CM

## 2021-11-07 DIAGNOSIS — E1165 Type 2 diabetes mellitus with hyperglycemia: Secondary | ICD-10-CM

## 2021-11-07 DIAGNOSIS — N1831 Chronic kidney disease, stage 3a: Secondary | ICD-10-CM

## 2021-11-07 DIAGNOSIS — R6889 Other general symptoms and signs: Secondary | ICD-10-CM

## 2021-11-07 DIAGNOSIS — Z794 Long term (current) use of insulin: Secondary | ICD-10-CM

## 2021-11-07 DIAGNOSIS — J069 Acute upper respiratory infection, unspecified: Secondary | ICD-10-CM

## 2021-11-07 LAB — POCT INFLUENZA A/B
Influenza A, POC: NEGATIVE
Influenza B, POC: NEGATIVE

## 2021-11-07 LAB — POCT GLYCOSYLATED HEMOGLOBIN (HGB A1C): Hemoglobin A1C: 7.2 % — AB (ref 4.0–5.6)

## 2021-11-07 MED ORDER — BENZONATATE 100 MG PO CAPS: 100.0000 mg | ORAL_CAPSULE | Freq: Two times a day (BID) | ORAL | 0 refills | Status: DC | PRN

## 2021-11-07 MED ORDER — SEMAGLUTIDE(0.25 OR 0.5MG/DOS) 2 MG/1.5ML ~~LOC~~ SOPN: 0.5000 mg | PEN_INJECTOR | SUBCUTANEOUS | 3 refills | Status: DC

## 2021-11-07 NOTE — Progress Notes (Signed)
[facility] ?[address] ?[place], [place] [address] ? ?Internal MEDICINE  ?Telephone Visit ? ?Patient Name: Gabriel Burton ? [id number]  ?[id number] ? ?Date of Service: 11/07/2021 ? ?I connected with the patient at 12:05 PM by telephone and verified the patients identity using two identifiers.   ?I discussed the limitations, risks, security and privacy concerns of performing an evaluation and management service by telephone and the availability of in person appointments. I also discussed with the patient that there may be a patient responsible charge related to the service.  The patient expressed understanding and agrees to proceed.   ? ?Chief Complaint  ?Patient presents with  ? Acute Visit  ?  Covid positive, flu like symptoms, did a1c and flu test on nurse visit, neg. Flu test. And 7.2 A1C  ? Telephone Assessment  ?  [phone]  ? Telephone Screen  ?  Phone call   ? Dizziness  ? Headache  ? Generalized Body Aches  ? Cough  ? Sinusitis  ?  Congestion   ? ? ?HPI ?Gabriel BerkshireJohnny presents for a telehealth virtual visit for diabetes and A1c.  Patient was coming to the clinic for his routine office visit follow-up but had tested positive for COVID at home and felt he was having flulike symptoms so he stayed in his car and was tested for the flu which was negative and then he reported that his home COVID test had 2 lines which is a positive so he is COVID-positive.  He states that he is starting to feel better but is still experiencing cough congestion, generalized body aches, headache, and dizziness. ?He was due to have his A1c checked so this was checked by the nurses when he was checked for the flu at his car in the parking lot.  His A1c is 7.2 today which is improved from 7.7 in January.  At his previous office visit he was started on Ozempic weekly injections at 0.25 mg and has not increased the dose yet and Victoza was discontinued.  He was having issues with the price of Victoza going up.  He currently has  multiple samples of Ozempic because a family member was trying it out but then was switched to Trulicity and she gave him the unopened Ozempic sample she had and this is the same dose that he is already on so this is appropriate. ? ? ?Current Medication: ?Outpatient Encounter Medications as of 11/07/2021  ?Medication Sig  ? amLODipine (NORVASC) 10 MG tablet Take 1 tablet (10 mg total) by mouth daily.  ? aspirin EC 81 MG tablet Take 81 mg by mouth daily.   ? atorvastatin (LIPITOR) 10 MG tablet Take 1 tablet (10 mg total) by mouth at bedtime.  ? benzonatate (TESSALON) 100 MG capsule Take 1 capsule (100 mg total) by mouth 2 (two) times daily as needed for cough.  ? carvedilol (COREG) 12.5 MG tablet Take 1.5 tablets (18.75 mg total) by mouth 2 (two) times daily with a meal.  ? Cholecalciferol (VITAMIN D3) 5000 units TABS Take 5,000 Units by mouth daily.   ? CINNAMON PO Take by mouth daily.   ? fluticasone (FLONASE) 50 MCG/ACT nasal spray Place 2 sprays into both nostrils daily.  ? gabapentin (NEURONTIN) 100 MG capsule Take 1 capsule (100 mg total) by mouth daily.  ? glucose blood test strip Check blood sugars TID  ? lisinopril-hydrochlorothiazide (ZESTORETIC) 20-12.5 MG tablet TAKE 2 TABLETS BY MOUTH ONCE DAILY FOR HIGH BLOOD PRESSURE  ? methocarbamol (ROBAXIN)  500 MG tablet Take 500 mg by mouth 3 (three) times daily as needed for muscle spasms.  ? nitroGLYCERIN (NITROSTAT) 0.4 MG SL tablet DISSOLVE ONE TABLET UNDER THE TONGUE EVERY 5 MINUTES AS NEEDED FOR CHEST PAIN.  DO NOT EXCEED A TOTAL OF 3 DOSES IN 15 MINUTES  ? pantoprazole (PROTONIX) 40 MG tablet Take 1 tablet (40 mg total) by mouth daily.  ? spironolactone (ALDACTONE) 25 MG tablet Take 1 tablet by mouth once daily  ? tadalafil (CIALIS) 20 MG tablet Take 1 tablet (20 mg total) by mouth daily as needed for erectile dysfunction.  ? tamsulosin (FLOMAX) 0.4 MG CAPS capsule TAKE 1 CAPSULE BY MOUTH ONE-HALF HOUR AFTER THE SAME MEAL EACH DAY  ? TOUJEO SOLOSTAR 300  UNIT/ML Solostar Pen Inject 40 units [place] daily. May increase up to 45 units as indicated.  ? TRUEPLUS PEN NEEDLES 32G X 4 MM MISC USE TWICE DAILY AS DIRECTED  ? vitamin C (ASCORBIC ACID) 500 MG tablet Take 500 mg by mouth daily.   ? [DISCONTINUED] Semaglutide,0.25 or 0.5MG /DOS, 2 MG/1.5ML SOPN Inject 0.25 mg into the skin once a week. After the 1st 4 doses, increase to 0.5 mg weekly.  ? Semaglutide,0.25 or 0.5MG /DOS, 2 MG/1.5ML SOPN Inject 0.5 mg into the skin once a week. After the 1st 4 doses, increase to 0.5 mg weekly.  ? ?No facility-administered encounter medications on file as of 11/07/2021.  ? ? ?Surgical History: ?History reviewed. No pertinent surgical history. ? ?Medical History: ?Past Medical History:  ?Diagnosis Date  ? Diabetes mellitus without complication (HCC)   ? Diastolic dysfunction   ? a. 09/2018 Echo: EF 63%, diast dysfxn. Mild conc LVH. Mild TR.  ? History of stress test   ? a. 09/2018 ETT: Ex time 3:50. BP 207/90. Inconclusive 2/2 LBBB.  Stopped 2/2 claudication; b. 12/2018 Lexi MV: fixed septal/apical defect - felt to be artifact 2/2 LBBB. Very mild cor Ca2+ and Ao atherosclerosis. Low-mod risk study. EF 43%.  ? Hyperlipidemia   ? Hypertension   ? a. 08/2020 Renal artery duplex: No evidence of RAS.  ? Left bundle branch block   ? ? ?Family History: ?Family History  ?Problem Relation Age of Onset  ? Hypertension Mother   ? Hypertension Father   ? Heart attack Brother 22  ? ? ?Social History  ? ?Socioeconomic History  ? Marital status: Widowed  ?  Spouse name: Not on file  ? Number of children: Not on file  ? Years of education: Not on file  ? Highest education level: Not on file  ?Occupational History  ? Not on file  ?Tobacco Use  ? Smoking status: Former  ?  Packs/day: 0.40  ?  Years: 8.00  ?  Pack years: 3.20  ?  Types: Cigarettes  ?  Quit date: 2000  ?  Years since quitting: 23.3  ? Smokeless tobacco: Never  ?Vaping Use  ? Vaping Use: Never used  ?Substance and Sexual Activity  ? Alcohol use:  Never  ? Drug use: Never  ? Sexual activity: Not on file  ?Other Topics Concern  ? Not on file  ?Social History Narrative  ? Not on file  ? ?Social Determinants of Health  ? ?Financial Resource Strain: Not on file  ?Food Insecurity: Not on file  ?Transportation Needs: Not on file  ?Physical Activity: Not on file  ?Stress: Not on file  ?Social Connections: Not on file  ?Intimate Partner Violence: Not on file  ? ? ? ? ?  Review of Systems  ?Constitutional:  Positive for fatigue. Negative for chills and fever.  ?HENT:  Positive for congestion, postnasal drip, rhinorrhea, sinus pressure, sinus pain, sneezing and sore throat. Negative for ear pain.   ?Respiratory:  Positive for cough. Negative for chest tightness, shortness of breath and wheezing.   ?Cardiovascular: Negative.  Negative for chest pain and palpitations.  ?Gastrointestinal:  Positive for constipation. Negative for diarrhea, nausea and vomiting.  ?Musculoskeletal:  Positive for myalgias.  ?Neurological:  Positive for dizziness and headaches.  ? ?Vital Signs: ?BP 137/77   Pulse 73   Resp 16   Ht 6\' 2"  (1.88 m)   Wt 210 lb (95.3 kg)   BMI 26.96 kg/m?  ? ? ?Observation/Objective: ?He is alert and oriented and engages in conversation appropriately.  He does not sound as though he is in any acute distress over telephone call. ? ? ? ?Assessment/Plan: ?1. Type 2 diabetes mellitus with stage 3a chronic kidney disease, with long-term current use of insulin (HCC) ?Patient has lost approximately 5 pounds on Ozempic and has only been on the 0.25 mg dose.  His A1c has also improved by 0.5 or 7.5 today compared to 7.7 in January.  He is reporting some side effects including strange dreams and some constipation but wants to continue the medication for a while longer and follow-up in 3 months.  He is going to increase the dose to 0.5 next week on Monday and will follow-up in late July for repeat A1c. ?- Semaglutide,0.25 or 0.5MG /DOS, 2 MG/1.5ML SOPN; Inject 0.5 mg into  the skin once a week.  Dispense: 1.5 mL; Refill: 3 ? ?2. Upper respiratory tract infection due to COVID-19 virus ?Encouraged symptomatic treatment.  Tessalon Perles prescribed to alleviate cough.  Instructed patien

## 2021-11-07 NOTE — Progress Notes (Signed)
Pt was here to do a1c and flu test, pt will do virtual with Alyssa today as well  ?

## 2021-11-09 ENCOUNTER — Telehealth: Payer: Self-pay

## 2021-11-09 NOTE — Telephone Encounter (Signed)
Emailed work note to patient. Sent to be scanned-Toni 

## 2021-12-30 ENCOUNTER — Other Ambulatory Visit: Payer: Self-pay | Admitting: Nurse Practitioner

## 2021-12-30 DIAGNOSIS — I10 Essential (primary) hypertension: Secondary | ICD-10-CM

## 2022-01-04 ENCOUNTER — Other Ambulatory Visit: Payer: Self-pay

## 2022-01-04 ENCOUNTER — Emergency Department: Payer: BC Managed Care – PPO

## 2022-01-04 ENCOUNTER — Encounter: Payer: Self-pay | Admitting: Emergency Medicine

## 2022-01-04 ENCOUNTER — Observation Stay
Admission: EM | Admit: 2022-01-04 | Discharge: 2022-01-05 | Disposition: A | Discharge status: Home / Self Care | Payer: BC Managed Care – PPO | Attending: Obstetrics and Gynecology | Admitting: Obstetrics and Gynecology

## 2022-01-04 DIAGNOSIS — I639 Cerebral infarction, unspecified: Principal | ICD-10-CM

## 2022-01-04 DIAGNOSIS — Z87891 Personal history of nicotine dependence: Secondary | ICD-10-CM | POA: Diagnosis not present

## 2022-01-04 DIAGNOSIS — H53462 Homonymous bilateral field defects, left side: Secondary | ICD-10-CM | POA: Insufficient documentation

## 2022-01-04 DIAGNOSIS — E1122 Type 2 diabetes mellitus with diabetic chronic kidney disease: Secondary | ICD-10-CM | POA: Diagnosis not present

## 2022-01-04 DIAGNOSIS — E1159 Type 2 diabetes mellitus with other circulatory complications: Secondary | ICD-10-CM

## 2022-01-04 DIAGNOSIS — I129 Hypertensive chronic kidney disease with stage 1 through stage 4 chronic kidney disease, or unspecified chronic kidney disease: Secondary | ICD-10-CM | POA: Diagnosis not present

## 2022-01-04 DIAGNOSIS — Z7982 Long term (current) use of aspirin: Secondary | ICD-10-CM | POA: Diagnosis not present

## 2022-01-04 DIAGNOSIS — I10 Essential (primary) hypertension: Secondary | ICD-10-CM

## 2022-01-04 DIAGNOSIS — E1169 Type 2 diabetes mellitus with other specified complication: Secondary | ICD-10-CM

## 2022-01-04 DIAGNOSIS — E119 Type 2 diabetes mellitus without complications: Secondary | ICD-10-CM

## 2022-01-04 DIAGNOSIS — Z7985 Long-term (current) use of injectable non-insulin antidiabetic drugs: Secondary | ICD-10-CM | POA: Diagnosis not present

## 2022-01-04 DIAGNOSIS — N1831 Chronic kidney disease, stage 3a: Secondary | ICD-10-CM | POA: Diagnosis not present

## 2022-01-04 DIAGNOSIS — Z79899 Other long term (current) drug therapy: Secondary | ICD-10-CM | POA: Insufficient documentation

## 2022-01-04 DIAGNOSIS — H5340 Unspecified visual field defects: Secondary | ICD-10-CM | POA: Diagnosis present

## 2022-01-04 DIAGNOSIS — E785 Hyperlipidemia, unspecified: Secondary | ICD-10-CM

## 2022-01-04 LAB — DIFFERENTIAL
Abs Immature Granulocytes: 0.01 10*3/uL (ref 0.00–0.07)
Basophils Absolute: 0 10*3/uL (ref 0.0–0.1)
Basophils Relative: 1 %
Eosinophils Absolute: 0.2 10*3/uL (ref 0.0–0.5)
Eosinophils Relative: 4 %
Immature Granulocytes: 0 %
Lymphocytes Relative: 37 %
Lymphs Abs: 2.2 10*3/uL (ref 0.7–4.0)
Monocytes Absolute: 0.5 10*3/uL (ref 0.1–1.0)
Monocytes Relative: 9 %
Neutro Abs: 3 10*3/uL (ref 1.7–7.7)
Neutrophils Relative %: 49 %

## 2022-01-04 LAB — COMPREHENSIVE METABOLIC PANEL
ALT: 31 U/L (ref 0–44)
AST: 27 U/L (ref 15–41)
Albumin: 4.1 g/dL (ref 3.5–5.0)
Alkaline Phosphatase: 101 U/L (ref 38–126)
Anion gap: 8 (ref 5–15)
BUN: 23 mg/dL (ref 8–23)
CO2: 26 mmol/L (ref 22–32)
Calcium: 9.7 mg/dL (ref 8.9–10.3)
Chloride: 106 mmol/L (ref 98–111)
Creatinine, Ser: 1.65 mg/dL — ABNORMAL HIGH (ref 0.61–1.24)
GFR, Estimated: 45 mL/min — ABNORMAL LOW (ref >60)
Glucose, Bld: 143 mg/dL — ABNORMAL HIGH (ref 70–99)
Potassium: 3.9 mmol/L (ref 3.5–5.1)
Sodium: 140 mmol/L (ref 135–145)
Total Bilirubin: 0.4 mg/dL (ref 0.3–1.2)
Total Protein: 8.1 g/dL (ref 6.5–8.1)

## 2022-01-04 LAB — CBC
HCT: 39.6 % (ref 39.0–52.0)
Hemoglobin: 12.8 g/dL — ABNORMAL LOW (ref 13.0–17.0)
MCH: 28.4 pg (ref 26.0–34.0)
MCHC: 32.3 g/dL (ref 30.0–36.0)
MCV: 88 fL (ref 80.0–100.0)
Platelets: 201 10*3/uL (ref 150–400)
RBC: 4.5 MIL/uL (ref 4.22–5.81)
RDW: 13.2 % (ref 11.5–15.5)
WBC: 6 10*3/uL (ref 4.0–10.5)
nRBC: 0 % (ref 0.0–0.2)

## 2022-01-04 LAB — PROTIME-INR
INR: 1.1 (ref 0.8–1.2)
Prothrombin Time: 14.5 seconds (ref 11.4–15.2)

## 2022-01-04 LAB — GLUCOSE, CAPILLARY: Glucose-Capillary: 149 mg/dL — ABNORMAL HIGH (ref 70–99)

## 2022-01-04 LAB — APTT: aPTT: 29 seconds (ref 24–36)

## 2022-01-04 LAB — ETHANOL: Alcohol, Ethyl (B): <10 mg/dL (ref <10)

## 2022-01-04 MED ORDER — BUTALBITAL-APAP-CAFFEINE 50-325-40 MG PO TABS
2.0000 | ORAL_TABLET | Freq: Once | ORAL | Status: AC
  Administered 2022-01-04: 2 via ORAL
  Filled 2022-01-04: qty 2

## 2022-01-04 MED ORDER — LISINOPRIL 20 MG PO TABS
40.0000 mg | ORAL_TABLET | Freq: Every day | ORAL | Status: DC
Start: 2022-01-05 — End: 2022-01-06
  Administered 2022-01-05: 40 mg via ORAL
  Filled 2022-01-04: qty 2

## 2022-01-04 MED ORDER — INSULIN ASPART 100 UNIT/ML IJ SOLN: 0.0000 [IU] | Freq: Every day | INTRAMUSCULAR | Status: DC

## 2022-01-04 MED ORDER — AMLODIPINE BESYLATE 5 MG PO TABS
10.0000 mg | ORAL_TABLET | Freq: Every day | ORAL | Status: DC
  Administered 2022-01-05: 10 mg via ORAL
  Filled 2022-01-04: qty 2

## 2022-01-04 MED ORDER — ATORVASTATIN CALCIUM 20 MG PO TABS
40.0000 mg | ORAL_TABLET | Freq: Every day | ORAL | Status: DC
  Administered 2022-01-05: 40 mg via ORAL
  Filled 2022-01-04: qty 2

## 2022-01-04 MED ORDER — ACETAMINOPHEN 650 MG RE SUPP: 650.0000 mg | RECTAL | Status: DC | PRN

## 2022-01-04 MED ORDER — ASPIRIN 81 MG PO CHEW
324.0000 mg | CHEWABLE_TABLET | Freq: Once | ORAL | Status: AC
  Administered 2022-01-04: 324 mg via ORAL

## 2022-01-04 MED ORDER — ASPIRIN 81 MG PO CHEW
CHEWABLE_TABLET | ORAL | Status: AC
  Filled 2022-01-04: qty 4

## 2022-01-04 MED ORDER — GADOBUTROL 1 MMOL/ML IV SOLN
9.0000 mL | Freq: Once | INTRAVENOUS | Status: AC | PRN
  Administered 2022-01-04: 9 mL via INTRAVENOUS

## 2022-01-04 MED ORDER — GABAPENTIN 100 MG PO CAPS
100.0000 mg | ORAL_CAPSULE | Freq: Every day | ORAL | Status: DC
  Administered 2022-01-05: 100 mg via ORAL
  Filled 2022-01-04: qty 1

## 2022-01-04 MED ORDER — METHOCARBAMOL 500 MG PO TABS
500.0000 mg | ORAL_TABLET | Freq: Three times a day (TID) | ORAL | Status: DC | PRN
Start: 2022-01-04 — End: 2022-01-06

## 2022-01-04 MED ORDER — ENOXAPARIN SODIUM 40 MG/0.4ML IJ SOSY
40.0000 mg | PREFILLED_SYRINGE | INTRAMUSCULAR | Status: DC
Start: 2022-01-04 — End: 2022-01-06
  Administered 2022-01-05: 40 mg via SUBCUTANEOUS
  Filled 2022-01-04: qty 0.4

## 2022-01-04 MED ORDER — TAMSULOSIN HCL 0.4 MG PO CAPS
0.4000 mg | ORAL_CAPSULE | Freq: Every day | ORAL | Status: DC
  Administered 2022-01-05: 0.4 mg via ORAL
  Filled 2022-01-04: qty 1

## 2022-01-04 MED ORDER — HYDROCHLOROTHIAZIDE 25 MG PO TABS
25.0000 mg | ORAL_TABLET | Freq: Every day | ORAL | Status: DC
  Administered 2022-01-05: 25 mg via ORAL
  Filled 2022-01-04: qty 1

## 2022-01-04 MED ORDER — ACETAMINOPHEN 160 MG/5ML PO SOLN
650.0000 mg | ORAL | Status: DC | PRN
  Filled 2022-01-04: qty 20.3

## 2022-01-04 MED ORDER — CLOPIDOGREL BISULFATE 75 MG PO TABS
75.0000 mg | ORAL_TABLET | Freq: Every day | ORAL | Status: DC
  Administered 2022-01-05: 75 mg via ORAL
  Filled 2022-01-04: qty 1

## 2022-01-04 MED ORDER — INSULIN ASPART 100 UNIT/ML IJ SOLN
0.0000 [IU] | Freq: Three times a day (TID) | INTRAMUSCULAR | Status: DC
  Administered 2022-01-05: 3 [IU] via SUBCUTANEOUS
  Administered 2022-01-05: 5 [IU] via SUBCUTANEOUS
  Filled 2022-01-04 (×2): qty 1

## 2022-01-04 MED ORDER — STROKE: EARLY STAGES OF RECOVERY BOOK: Freq: Once | Status: AC

## 2022-01-04 MED ORDER — ASPIRIN 81 MG PO TBEC
81.0000 mg | DELAYED_RELEASE_TABLET | Freq: Every day | ORAL | Status: DC
  Administered 2022-01-05: 81 mg via ORAL
  Filled 2022-01-04: qty 1

## 2022-01-04 MED ORDER — LISINOPRIL-HYDROCHLOROTHIAZIDE 20-12.5 MG PO TABS: 2.0000 | ORAL_TABLET | Freq: Every day | ORAL | Status: DC

## 2022-01-04 MED ORDER — ACETAMINOPHEN 325 MG PO TABS
650.0000 mg | ORAL_TABLET | ORAL | Status: DC | PRN
  Administered 2022-01-05: 650 mg via ORAL
  Filled 2022-01-04: qty 2

## 2022-01-04 MED ORDER — CARVEDILOL 6.25 MG PO TABS
18.7500 mg | ORAL_TABLET | Freq: Two times a day (BID) | ORAL | Status: DC
  Administered 2022-01-05 (×2): 18.75 mg via ORAL
  Filled 2022-01-04 (×2): qty 3

## 2022-01-04 NOTE — ED Notes (Signed)
This rn tried 2x for IV no success

## 2022-01-04 NOTE — Assessment & Plan Note (Signed)
Continue home amlodipine, carvedilol, lisinopril HCTZ

## 2022-01-04 NOTE — ED Notes (Signed)
MRI screening pt now

## 2022-01-04 NOTE — H&P (Addendum)
History and Physical    Patient: Gabriel Burton WCB:[id number] DOB: [birth year] DOA: 01/04/2022 DOS: the patient was seen and examined on 01/04/2022 PCP: Sallyanne Kuster, NP  Patient coming from: Home  Chief Complaint:  Chief Complaint  Patient presents with   Visual Field Change    HPI: Gabriel Burton is a 71 y.o. male with medical history significant for DM, HTN, CKD 3a, HLD, known LBBB who presents to the ED on referral from his ophthalmologist with visual disturbance.  States he awoke 3 days ago, on Monday morning with left-sided visual field deficit preceded by a week of frequent headaches.  He also had dizziness that was transient. He denies one-sided weakness numbness or tingling in the face or extremities and denies difficulty with speech or swallowing. ED course and data review: BP 156/81 with otherwise normal vitals Labs: Creatinine at baseline at 1.65, CBC at baseline at 12.8, otherwise CBC, CMP unremarkable.  EtOH less than 10. EKG, personally viewed and interpreted: Sinus rhythm at 79 with LBBB and no acute ST-T wave changes Imaging: CT head showed a concerning abnormality with recommendation for MRI MRI brain with and without contrast showed the following findings:: IMPRESSION: 1. Medial right occipital cortical infarct, likely late acute to early subacute, with petechial hemorrhage (PH 2). This area is associated with edema and gyral swelling, with local mass effect, but no midline shift.   2. Small area of acute or subacute infarct in the superior right cerebellum.  Patient given aspirin.  Hospitalist consulted for admission.   Review of Systems: As mentioned in the history of present illness. All other systems reviewed and are negative.  Past Medical History:  Diagnosis Date   Diabetes mellitus without complication (HCC)    Diastolic dysfunction    a. 09/2018 Echo: EF 63%, diast dysfxn. Mild conc LVH. Mild TR.   History of stress test    a. 09/2018 ETT: Ex  time 3:50. BP 207/90. Inconclusive 2/2 LBBB.  Stopped 2/2 claudication; b. 12/2018 Lexi MV: fixed septal/apical defect - felt to be artifact 2/2 LBBB. Very mild cor Ca2+ and Ao atherosclerosis. Low-mod risk study. EF 43%.   Hyperlipidemia    Hypertension    a. 08/2020 Renal artery duplex: No evidence of RAS.   Left bundle branch block    History reviewed. No pertinent surgical history. Social History:  reports that he quit smoking about 23 years ago. His smoking use included cigarettes. He has a 3.20 pack-year smoking history. He has never used smokeless tobacco. He reports that he does not drink alcohol and does not use drugs.  No Known Allergies  Family History  Problem Relation Age of Onset   Hypertension Mother    Hypertension Father    Heart attack Brother 25    Prior to Admission medications   Medication Sig Start Date End Date Taking? Authorizing Provider  amLODipine (NORVASC) 10 MG tablet Take 1 tablet (10 mg total) by mouth daily. 08/25/21  Yes Abernathy, Arlyss Repress, NP  aspirin EC 81 MG tablet Take 81 mg by mouth daily.    Yes [provider]  atorvastatin (LIPITOR) 10 MG tablet Take 1 tablet (10 mg total) by mouth at bedtime. 04/29/21  Yes Abernathy, Arlyss Repress, NP  carvedilol (COREG) 12.5 MG tablet Take 1.5 tablets (18.75 mg total) by mouth 2 (two) times daily with a meal. 06/24/21  Yes Creig Hines, NP  Cholecalciferol (VITAMIN D3) 5000 units TABS Take 5,000 Units by mouth daily.    Yes [provider]  CINNAMON PO Take by mouth daily.    Yes [provider]  fluticasone (FLONASE) 50 MCG/ACT nasal spray Place 2 sprays into both nostrils daily. 08/25/21  Yes Abernathy, Arlyss Repress, NP  gabapentin (NEURONTIN) 100 MG capsule Take 1 capsule (100 mg total) by mouth daily. 04/29/21  Yes Abernathy, Alyssa, NP  lisinopril-hydrochlorothiazide (ZESTORETIC) 20-12.5 MG tablet TAKE 2 TABLETS BY MOUTH ONCE DAILY FOR HIGH BLOOD PRESSURE 12/30/21  Yes Abernathy, Alyssa, NP   methocarbamol (ROBAXIN) 500 MG tablet Take 500 mg by mouth 3 (three) times daily as needed for muscle spasms.   Yes [provider]  pantoprazole (PROTONIX) 40 MG tablet Take 1 tablet (40 mg total) by mouth daily. 04/29/21  Yes Abernathy, Arlyss Repress, NP  Semaglutide,0.25 or 0.5MG /DOS, 2 MG/1.5ML SOPN Inject 0.5 mg into the skin once a week. 11/07/21  Yes Abernathy, Arlyss Repress, NP  spironolactone (ALDACTONE) 25 MG tablet Take 1 tablet by mouth once daily 09/06/21  Yes End, Cristal Deer, MD  tadalafil (CIALIS) 20 MG tablet Take 1 tablet (20 mg total) by mouth daily as needed for erectile dysfunction. 04/29/21  Yes Abernathy, Alyssa, NP  tamsulosin (FLOMAX) 0.4 MG CAPS capsule TAKE 1 CAPSULE BY MOUTH ONE-HALF HOUR AFTER THE SAME MEAL EACH DAY 04/29/21  Yes Abernathy, Alyssa, NP  TOUJEO SOLOSTAR 300 UNIT/ML Solostar Pen Inject 40 units Poneto daily. May increase up to 45 units as indicated. 04/29/21  Yes Abernathy, Arlyss Repress, NP  vitamin C (ASCORBIC ACID) 500 MG tablet Take 500 mg by mouth daily.    Yes [provider]  benzonatate (TESSALON) 100 MG capsule Take 1 capsule (100 mg total) by mouth 2 (two) times daily as needed for cough. Patient not taking: Reported on 01/04/2022 11/07/21   Sallyanne Kuster, NP  nitroGLYCERIN (NITROSTAT) 0.4 MG SL tablet DISSOLVE ONE TABLET UNDER THE TONGUE EVERY 5 MINUTES AS NEEDED FOR CHEST PAIN.  DO NOT EXCEED A TOTAL OF 3 DOSES IN 15 MINUTES 10/17/21   End, Cristal Deer, MD    Physical Exam: Vitals:   01/04/22 2015 01/04/22 2030 01/04/22 2215 01/04/22 2230  BP: (!) 158/84 (!) 146/75 (!) 152/88 (!) 149/75  Pulse: 76 71 78 74  Resp: 13 10 17 14   Temp:      TempSrc:      SpO2: 97% 96% 99% 98%  Weight:      Height:       Physical Exam Vitals and nursing note reviewed.  Constitutional:      General: He is not in acute distress. HENT:     Head: Normocephalic and atraumatic.  Cardiovascular:     Rate and Rhythm: Normal rate and regular rhythm.     Heart sounds:  Normal heart sounds.  Pulmonary:     Effort: Pulmonary effort is normal.     Breath sounds: Normal breath sounds.  Abdominal:     Palpations: Abdomen is soft.     Tenderness: There is no abdominal tenderness.  Neurological:     Mental Status: Mental status is at baseline.    Labs on Admission: I have personally reviewed following labs and imaging studies  CBC: Recent Labs  Lab 01/04/22 1623  WBC 6.0  NEUTROABS 3.0  HGB 12.8*  HCT 39.6  MCV 88.0  PLT 201   Basic Metabolic Panel: Recent Labs  Lab 01/04/22 1623  NA 140  K 3.9  CL 106  CO2 26  GLUCOSE 143*  BUN 23  CREATININE 1.65*  CALCIUM 9.7   GFR: Estimated Creatinine Clearance: 49.1  mL/min (A) (by C-G formula based on SCr of 1.65 mg/dL (H)). Liver Function Tests: Recent Labs  Lab 01/04/22 1623  AST 27  ALT 31  ALKPHOS 101  BILITOT 0.4  PROT 8.1  ALBUMIN 4.1   No results for input(s): "LIPASE", "AMYLASE" in the last 168 hours. No results for input(s): "AMMONIA" in the last 168 hours. Coagulation Profile: Recent Labs  Lab 01/04/22 1623  INR 1.1   Cardiac Enzymes: No results for input(s): "CKTOTAL", "CKMB", "CKMBINDEX", "TROPONINI" in the last 168 hours. BNP (last 3 results) No results for input(s): "PROBNP" in the last 8760 hours. HbA1C: No results for input(s): "HGBA1C" in the last 72 hours. CBG: No results for input(s): "GLUCAP" in the last 168 hours. Lipid Profile: No results for input(s): "CHOL", "HDL", "LDLCALC", "TRIG", "CHOLHDL", "LDLDIRECT" in the last 72 hours. Thyroid Function Tests: No results for input(s): "TSH", "T4TOTAL", "FREET4", "T3FREE", "THYROIDAB" in the last 72 hours. Anemia Panel: No results for input(s): "VITAMINB12", "FOLATE", "FERRITIN", "TIBC", "IRON", "RETICCTPCT" in the last 72 hours. Urine analysis:    Component Value Date/Time   APPEARANCEUR Clear 07/08/2021 1026   LABSPEC 1.010 03/26/2013 1040   GLUCOSEU Negative 07/08/2021 1026   GLUCOSEU 1000 mg/dL  46/96/2952 8413   HGBUR NEGATIVE 03/26/2013 1040   BILIRUBINUR Negative 07/08/2021 1026   PROTEINUR Negative 07/08/2021 1026   PROTEINUR NEGATIVE 03/26/2013 1040   NITRITE Negative 07/08/2021 1026   LEUKOCYTESUR 1+ (A) 07/08/2021 1026    Radiological Exams on Admission: MR Brain W and Wo Contrast  Result Date: 01/04/2022 CLINICAL DATA:  Evaluate occipital mass, left hemianopsia and loss of coordination, abnormal CT EXAM: MRI HEAD WITHOUT AND WITH CONTRAST TECHNIQUE: Multiplanar, multiecho pulse sequences of the brain and surrounding structures were obtained without and with intravenous contrast. CONTRAST:  6mL GADAVIST GADOBUTROL 1 MMOL/ML IV SOLN COMPARISON:  None Available. FINDINGS: Brain: Restricted diffusion with ADC correlate in the medial right occipital cortex (series 5, images 15-28 and series 7, image 9), in an area measuring approximately 5.1 x 2.7 x 3.9 cm. Additional smaller, subcentimeter, wedge-shaped area of restricted diffusion with ADC correlate in the superomedial right cerebellum (series 5, image 12 and series 7, image 9). The left occipital infarct is associated with hemosiderin deposition (series 13, images 25-33), most likely petechial hemorrhage (PH 2), which correlates with the hypodensity seen on the same-day CT. No hemosiderin deposition is associated with the occipital infarct. Both infarcts are associated with increased T2 signal, with gyral swelling and local mass effect in the medial right occipital lobe. No abnormal parenchymal or meningeal enhancement. No additional area of infarct or hemorrhage. No mass, midline shift, hydrocephalus, or extra-axial collection. Scattered and confluent T2 hyperintense signal in the periventricular white matter and pons, likely the sequela of moderate chronic small vessel ischemic disease. Vascular: Normal arterial flow voids. Normal arterial and venous enhancement. Skull and upper cervical spine: Normal marrow signal. No abnormal  enhancement. Sinuses/Orbits: Minimal mucosal thickening in the ethmoid air cells. The orbits are unremarkable. Other: Trace fluid in the right mastoid air cells. IMPRESSION: 1. Medial right occipital cortical infarct, likely late acute to early subacute, with petechial hemorrhage (PH 2). This area is associated with edema and gyral swelling, with local mass effect, but no midline shift. 2. Small area of acute or subacute infarct in the superior right cerebellum. These results were called by telephone at the time of interpretation on 01/04/2022 at 9:53 pm to provider [facility] , who verbally acknowledged these results. Electronically Signed  By: Wiliam Ke M.D.   On: 01/04/2022 21:55   CT HEAD WO CONTRAST  Result Date: 01/04/2022 CLINICAL DATA:  Headaches EXAM: CT HEAD WITHOUT CONTRAST TECHNIQUE: Contiguous axial images were obtained from the base of the skull through the vertex without intravenous contrast. RADIATION DOSE REDUCTION: This exam was performed according to the departmental dose-optimization program which includes automated exposure control, adjustment of the mA and/or kV according to patient size and/or use of iterative reconstruction technique. COMPARISON:  None Available. FINDINGS: Brain: There are no signs of bleeding within the cranium. There is 2.2 cm ill-defined density surrounded by edema in the right parieto-occipital region. Ventricles are not dilated. There is no shift of midline structures. There are no epidural or subdural fluid collections. Vascular: Unremarkable. Skull: No fracture is seen in the calvarium. Sinuses/Orbits: Unremarkable. Other: None. IMPRESSION: There are no signs of bleeding within the cranium. There is 2.2 cm area of subtle increased density with surrounding edema in the right parieto-occipital cortex. Possibility of neoplasm is not excluded. Follow-up MRI without and with contrast enhancement should be considered. There is no dilation of ventricles. There is no  shift of midline structures. Electronically Signed   By: Ernie Avena M.D.   On: 01/04/2022 17:28     Data Reviewed: Relevant notes from primary care and specialist visits, past discharge summaries as available in EHR, including Care Everywhere. Prior diagnostic testing as pertinent to current admission diagnoses Updated medications and problem lists for reconciliation ED course, including vitals, labs, imaging, treatment and response to treatment Triage notes, nursing and pharmacy notes and ED provider's notes Notable results as noted in HPI   Assessment and Plan: * Acute CVA (cerebrovascular accident) (HCC) Resume antihypertensives as patient is already 48 hrs post stroke onset for goal SBP under 140  Intensify home atorvastatin for LDL goal less than 70 ASA 81mg  daily, Plavix 75mg  daily x 3 weeks then monotherapy with Plavix thereafter Telemetry, echocardiogram and carotid Doppler Avoid dextrose containing fluids, Maintain euglycemia, euthermia Neuro checks q4 hrs x 24 hrs and then per shift Head of bed 30 degrees Physical therapy/Occupational therapy/Speech therapy if failed dysphagia screen Neurology consult to follow for additional recommendations   Stage 3a chronic kidney disease (HCC) Renal function at baseline  Hyperlipidemia Home atorvastatin increased from 10 mg to 40 mg Follow lipid profile  Hypertension Continue home amlodipine, carvedilol, lisinopril HCTZ  Diabetes mellitus, type II (HCC) Sliding scale insulin coverage        DVT prophylaxis: Lovenox  Consults: Neurology Dr.  Advance Care Planning:   Code Status: Full Code   Family Communication: none  Disposition Plan: Back to previous home environment  Severity of Illness: The appropriate patient status for this patient is OBSERVATION. Observation status is judged to be reasonable and necessary in order to provide the required intensity of service to ensure the patient's  safety. The patient's presenting symptoms, physical exam findings, and initial radiographic and laboratory data in the context of their medical condition is felt to place them at decreased risk for further clinical deterioration. Furthermore, it is anticipated that the patient will be medically stable for discharge from the hospital within 2 midnights of admission.   Author: , MD 01/04/2022 11:02 PM  For on call review [email or web address].

## 2022-01-04 NOTE — Assessment & Plan Note (Signed)
Resume antihypertensives as patient is already 48 hrs post stroke onset for goal SBP under 140  Intensify home atorvastatin for LDL goal less than 70 ASA 81mg  daily, Plavix 75mg  daily x 3 weeks then monotherapy with Plavix thereafter Telemetry, echocardiogram and carotid Doppler Avoid dextrose containing fluids, Maintain euglycemia, euthermia Neuro checks q4 hrs x 24 hrs and then per shift Head of bed 30 degrees Physical therapy/Occupational therapy/Speech therapy if failed dysphagia screen Neurology consult to follow for additional recommendations

## 2022-01-04 NOTE — ED Notes (Signed)
Patient to MRI at this time.

## 2022-01-04 NOTE — ED Triage Notes (Signed)
Pt via POV from home. Pt c/o L hemianopia and loss of coordination that started Monday. Pt was sent over from from his eye doctor for possible stroke. Denies any weakness or numbness. Pt c/o headache also. Pt has a hx of HTN. Pt is A&OX4 and NAD

## 2022-01-04 NOTE — ED Provider Notes (Signed)
[facility] Provider Note    Event Date/Time   First MD Initiated Contact with Patient 01/04/22 1832     (approximate)   History   Visual Field Change   HPI  Gabriel Burton is a 71 y.o. male who presents to the ED for evaluation of Visual Field Change   I reviewed cardiology clinic visit from February.  History of HTN, HLD, DM.  Known left bundle.  Patient presents to the ED for evaluation of vision changes for the past 3 days.  Reports awakening Monday morning with inability to see to the left-sided visual fields.  Reports frequent headaches that are novel as well over the past week or so.  No weakness or sensation changes to the extremities.  No falls, syncopal episodes, fevers or recent illnesses.   Physical Exam   Triage Vital Signs: ED Triage Vitals  Enc Vitals Group     BP 01/04/22 1622 (!) 156/81     Pulse Rate 01/04/22 1622 72     Resp 01/04/22 1622 20     Temp 01/04/22 1622 98.2 F (36.8 C)     Temp Source 01/04/22 1622 Oral     SpO2 01/04/22 1622 98 %     Weight 01/04/22 1619 210 lb (95.3 kg)     Height 01/04/22 1619 6\' 2"  (1.88 m)     Head Circumference --      Peak Flow --      Pain Score 01/04/22 1619 5     Pain Loc --      Pain Edu? --      Excl. in GC? --     Most recent vital signs: Vitals:   01/04/22 2215 01/04/22 2230  BP: (!) 152/88 (!) 149/75  Pulse: 78 74  Resp: 17 14  Temp:    SpO2: 99% 98%    General: Awake, no distress.  Sitting up in bed, pleasant and conversational.  Well-appearing. CV:  Good peripheral perfusion.  Resp:  Normal effort.  Abd:  No distention.  MSK:  No deformity noted.  Neuro:  Left-sided homonymous hemianopsia is noted.  Otherwise no focal deficits appreciated. Other:     ED Results / Procedures / Treatments   Labs (all labs ordered are listed, but only abnormal results are displayed) Labs Reviewed  CBC - Abnormal; Notable for the following components:      Result Value    Hemoglobin 12.8 (*)    All other components within normal limits  COMPREHENSIVE METABOLIC PANEL - Abnormal; Notable for the following components:   Glucose, Bld 143 (*)    Creatinine, Ser 1.65 (*)    GFR, Estimated 45 (*)    All other components within normal limits  PROTIME-INR  APTT  DIFFERENTIAL  ETHANOL  CBG MONITORING, ED    EKG Sinus rhythm, rate of 79 bpm.  Leftward axis and left bundle.  No STEMI by Sgarbossa criteria.  RADIOLOGY CT head obtained by me with right occipital mass with associated edema.  Official radiology report(s): MR Brain W and Wo Contrast  Result Date: 01/04/2022 CLINICAL DATA:  Evaluate occipital mass, left hemianopsia and loss of coordination, abnormal CT EXAM: MRI HEAD WITHOUT AND WITH CONTRAST TECHNIQUE: Multiplanar, multiecho pulse sequences of the brain and surrounding structures were obtained without and with intravenous contrast. CONTRAST:  62mL GADAVIST GADOBUTROL 1 MMOL/ML IV SOLN COMPARISON:  None Available. FINDINGS: Brain: Restricted diffusion with ADC correlate in the medial right occipital cortex (series 5, images 15-28  and series 7, image 9), in an area measuring approximately 5.1 x 2.7 x 3.9 cm. Additional smaller, subcentimeter, wedge-shaped area of restricted diffusion with ADC correlate in the superomedial right cerebellum (series 5, image 12 and series 7, image 9). The left occipital infarct is associated with hemosiderin deposition (series 13, images 25-33), most likely petechial hemorrhage (PH 2), which correlates with the hypodensity seen on the same-day CT. No hemosiderin deposition is associated with the occipital infarct. Both infarcts are associated with increased T2 signal, with gyral swelling and local mass effect in the medial right occipital lobe. No abnormal parenchymal or meningeal enhancement. No additional area of infarct or hemorrhage. No mass, midline shift, hydrocephalus, or extra-axial collection. Scattered and confluent T2  hyperintense signal in the periventricular white matter and pons, likely the sequela of moderate chronic small vessel ischemic disease. Vascular: Normal arterial flow voids. Normal arterial and venous enhancement. Skull and upper cervical spine: Normal marrow signal. No abnormal enhancement. Sinuses/Orbits: Minimal mucosal thickening in the ethmoid air cells. The orbits are unremarkable. Other: Trace fluid in the right mastoid air cells. IMPRESSION: 1. Medial right occipital cortical infarct, likely late acute to early subacute, with petechial hemorrhage (PH 2). This area is associated with edema and gyral swelling, with local mass effect, but no midline shift. 2. Small area of acute or subacute infarct in the superior right cerebellum. These results were called by telephone at the time of interpretation on 01/04/2022 at 9:53 pm to provider [facility] , who verbally acknowledged these results. Electronically Signed   By: Merilyn Baba M.D.   On: 01/04/2022 21:55   CT HEAD WO CONTRAST  Result Date: 01/04/2022 CLINICAL DATA:  Headaches EXAM: CT HEAD WITHOUT CONTRAST TECHNIQUE: Contiguous axial images were obtained from the base of the skull through the vertex without intravenous contrast. RADIATION DOSE REDUCTION: This exam was performed according to the departmental dose-optimization program which includes automated exposure control, adjustment of the mA and/or kV according to patient size and/or use of iterative reconstruction technique. COMPARISON:  None Available. FINDINGS: Brain: There are no signs of bleeding within the cranium. There is 2.2 cm ill-defined density surrounded by edema in the right parieto-occipital region. Ventricles are not dilated. There is no shift of midline structures. There are no epidural or subdural fluid collections. Vascular: Unremarkable. Skull: No fracture is seen in the calvarium. Sinuses/Orbits: Unremarkable. Other: None. IMPRESSION: There are no signs of bleeding within the  cranium. There is 2.2 cm area of subtle increased density with surrounding edema in the right parieto-occipital cortex. Possibility of neoplasm is not excluded. Follow-up MRI without and with contrast enhancement should be considered. There is no dilation of ventricles. There is no shift of midline structures. Electronically Signed   By: Elmer Picker M.D.   On: 01/04/2022 17:28    PROCEDURES and INTERVENTIONS:  .1-3 Lead EKG Interpretation  Performed by: Vladimir Crofts, MD Authorized by: Vladimir Crofts, MD     Interpretation: normal     ECG rate:  70   ECG rate assessment: normal     Rhythm: sinus rhythm     Ectopy: none     Conduction: normal   .Critical Care  Performed by: Vladimir Crofts, MD Authorized by: Vladimir Crofts, MD   Critical care provider statement:    Critical care time (minutes):  30   Critical care time was exclusive of:  Separately billable procedures and treating other patients   Critical care was necessary to treat or prevent imminent or life-threatening  deterioration of the following conditions:  CNS failure or compromise   Critical care was time spent personally by me on the following activities:  Development of treatment plan with patient or surrogate, discussions with consultants, evaluation of patient's response to treatment, examination of patient, ordering and review of laboratory studies, ordering and review of radiographic studies, ordering and performing treatments and interventions, pulse oximetry, re-evaluation of patient's condition and review of old charts   Medications  aspirin chewable tablet 324 mg (has no administration in time range)  butalbital-acetaminophen-caffeine (FIORICET) 50-325-40 MG per tablet 2 tablet (2 tablets Oral Given 01/04/22 1916)  gadobutrol (GADAVIST) 1 MMOL/ML injection 9 mL (9 mLs Intravenous Contrast Given 01/04/22 2124)     IMPRESSION / MDM / ASSESSMENT AND PLAN / ED COURSE  I reviewed the triage vital signs and the nursing  notes.  Differential diagnosis includes, but is not limited to, seizure, stroke, brain tumor, metastatic disease  {Patient presents with symptoms of an acute illness or injury that is potentially life-threatening.  71 year old male presents to the ED with 3 days of left-sided homonymous hemianopsia with evidence of a stroke requiring medical admission for stroke work-up.  He looks systemically well and has no other neurologic deficits beyond hemianopsia that I can appreciate.  His blood work is benign with normal CBC.  Metabolic panel with CKD.  EKG without ischemic features.  The CT scan of his head actually looks like a mass with peripheral edema, so follow-up MRI with and without contrast obtained but this demonstrates an acute infarct with some petechial hemorrhagic conversion.  I consulted medicine who agrees to admit. Patient updated.  Clinical Course as of 01/04/22 2242  Wed Jan 04, 2022  2153 Call from rads [DS]    Clinical Course User Index [DS] Delton Prairie, MD     FINAL CLINICAL IMPRESSION(S) / ED DIAGNOSES   Final diagnoses:  Cerebrovascular accident (CVA), unspecified mechanism (HCC)  Homonymous hemianopsia, left     Rx / DC Orders   ED Discharge Orders     None        Note:  This document was prepared using Dragon voice recognition software and may include unintentional dictation errors.   Delton Prairie, MD 01/04/22 [id number]

## 2022-01-04 NOTE — Assessment & Plan Note (Signed)
Sliding scale insulin coverage 

## 2022-01-04 NOTE — Assessment & Plan Note (Signed)
Home atorvastatin increased from 10 mg to 40 mg Follow lipid profile

## 2022-01-04 NOTE — ED Notes (Signed)
Pt given food tray at this time

## 2022-01-04 NOTE — Assessment & Plan Note (Signed)
Renal function at baseline 

## 2022-01-05 ENCOUNTER — Observation Stay: Payer: BC Managed Care – PPO

## 2022-01-05 ENCOUNTER — Telehealth: Payer: Self-pay | Admitting: *Deleted

## 2022-01-05 ENCOUNTER — Encounter: Payer: Self-pay | Admitting: Internal Medicine

## 2022-01-05 ENCOUNTER — Observation Stay (HOSPITAL_BASED_OUTPATIENT_CLINIC_OR_DEPARTMENT_OTHER)
Admit: 2022-01-05 | Discharge: 2022-01-05 | Disposition: A | Discharge status: Home / Self Care | Payer: BC Managed Care – PPO | Attending: Internal Medicine | Admitting: Internal Medicine

## 2022-01-05 DIAGNOSIS — I639 Cerebral infarction, unspecified: Secondary | ICD-10-CM | POA: Diagnosis not present

## 2022-01-05 DIAGNOSIS — I6389 Other cerebral infarction: Secondary | ICD-10-CM

## 2022-01-05 LAB — ECHOCARDIOGRAM COMPLETE
AR max vel: 2.88 cm2
AV Area VTI: 2.79 cm2
AV Area mean vel: 3.19 cm2
AV Mean grad: 3 mmHg
AV Peak grad: 7 mmHg
Ao pk vel: 1.32 m/s
Area-P 1/2: 4.44 cm2
Height: 74 in
MV VTI: 3.94 cm2
S' Lateral: 3.7 cm
Weight: 3360 oz

## 2022-01-05 LAB — LIPID PANEL
Cholesterol: 128 mg/dL (ref 0–200)
HDL: 51 mg/dL (ref >40)
LDL Cholesterol: 64 mg/dL (ref 0–99)
Total CHOL/HDL Ratio: 2.5 RATIO
Triglycerides: 63 mg/dL (ref <150)
VLDL: 13 mg/dL (ref 0–40)

## 2022-01-05 LAB — GLUCOSE, CAPILLARY
Glucose-Capillary: 111 mg/dL — ABNORMAL HIGH (ref 70–99)
Glucose-Capillary: 250 mg/dL — ABNORMAL HIGH (ref 70–99)
Glucose-Capillary: 194 mg/dL — ABNORMAL HIGH (ref 70–99)

## 2022-01-05 MED ORDER — INSULIN GLARGINE-YFGN 100 UNIT/ML ~~LOC~~ SOLN
30.0000 [IU] | Freq: Every day | SUBCUTANEOUS | Status: DC
  Filled 2022-01-05: qty 0.3

## 2022-01-05 MED ORDER — PANTOPRAZOLE SODIUM 40 MG PO TBEC
40.0000 mg | DELAYED_RELEASE_TABLET | Freq: Every day | ORAL | Status: DC
  Administered 2022-01-05: 40 mg via ORAL
  Filled 2022-01-05: qty 1

## 2022-01-05 MED ORDER — SPIRONOLACTONE 25 MG PO TABS
25.0000 mg | ORAL_TABLET | Freq: Every day | ORAL | Status: DC
Start: 2022-01-05 — End: 2022-01-06
  Administered 2022-01-05: 25 mg via ORAL
  Filled 2022-01-05: qty 1

## 2022-01-05 MED ORDER — CLOPIDOGREL BISULFATE 75 MG PO TABS: 75.0000 mg | ORAL_TABLET | Freq: Every day | ORAL | 0 refills | Status: DC

## 2022-01-05 NOTE — Telephone Encounter (Signed)
Attempted to call pt. No answer. Lmtcb.   Pt currently admitted.

## 2022-01-05 NOTE — Telephone Encounter (Signed)
-----   Message from Cadence David Stall, PA-C sent at 01/05/2022  4:21 PM EDT ----- Regarding: heart monitor Pt needs 2 week heart monitor to monitor for afib, recent CVA. Needs 2 month follow-up

## 2022-01-05 NOTE — TOC Progression Note (Signed)
Transition of Care [facility]) - Progression Note    Patient Details  Name: ARIZ TERRONES MRN: [id number] Date of Birth: 1950-07-25  [facility]) CM/SW Contact  Maree Krabbe, LCSW Phone Number: 01/05/2022, 4:38 PM  Clinical Narrative:   Referral for outpatient OT faxed.         Expected Discharge Plan and Services           Expected Discharge Date: 01/05/22                                     Social Determinants of Health (SDOH) Interventions    Readmission Risk Interventions     No data to display

## 2022-01-05 NOTE — Consult Note (Signed)
Neurology Consultation Reason for Consult: Stroke Referring Physician: Ashok Pall, N  CC: Vision change  History is obtained from: Patient  HPI: Gabriel Burton is a 71 y.o. male with a history of hypertension, hyperlipidemia, diabetes who presents with vision change that was present on awakening Monday morning.  He states that he was having trouble seeing things the left side.  When this did not improve he sought care in the emergency department where head CT revealed a subacute stroke.  He was admitted for secondary risk factor modification.   LKW: Sunday prior to bed tpa given?: no, out of window   ROS: A 14 point ROS was performed and is negative except as noted in the HPI.  Past Medical History:  Diagnosis Date   Diabetes mellitus without complication (HCC)    Diastolic dysfunction    a. 09/2018 Echo: EF 63%, diast dysfxn. Mild conc LVH. Mild TR.   History of stress test    a. 09/2018 ETT: Ex time 3:50. BP 207/90. Inconclusive 2/2 LBBB.  Stopped 2/2 claudication; b. 12/2018 Lexi MV: fixed septal/apical defect - felt to be artifact 2/2 LBBB. Very mild cor Ca2+ and Ao atherosclerosis. Low-mod risk study. EF 43%.   Hyperlipidemia    Hypertension    a. 08/2020 Renal artery duplex: No evidence of RAS.   Left bundle branch block      Family History  Problem Relation Age of Onset   Hypertension Mother    Hypertension Father    Heart attack Brother 50     Social History:  reports that he quit smoking about 23 years ago. His smoking use included cigarettes. He has a 3.20 pack-year smoking history. He has never used smokeless tobacco. He reports that he does not drink alcohol and does not use drugs.   Exam: Current vital signs: BP 122/71 (BP Location: Left Arm)   Pulse 66   Temp 98 F (36.7 C)   Resp 16   Ht 6\' 2"  (1.88 m)   Wt 95.3 kg   SpO2 100%   BMI 26.96 kg/m  Vital signs in last 24 hours: Temp:  [97.9 F (36.6 C)-98.2 F (36.8 C)] 98 F (36.7 C) (06/22 0750) Pulse  Rate:  [61-78] 66 (06/22 0750) Resp:  [10-20] 16 (06/22 0330) BP: (122-168)/(71-90) 122/71 (06/22 0750) SpO2:  [96 %-100 %] 100 % (06/22 0750) Weight:  [95.3 kg] 95.3 kg (06/21 1619)   Physical Exam  Constitutional: Appears well-developed and well-nourished.  Psych: Affect appropriate to situation Eyes: No scleral injection HENT: No OP obstruction MSK: no joint deformities.  Cardiovascular: Normal rate and regular rhythm.  Respiratory: Effort normal, non-labored breathing GI: Soft.  No distension. There is no tenderness.  Skin: WDI  Neuro: Mental Status: Patient is awake, alert, oriented to person, place, month, year, and situation. Patient is able to give a clear and coherent history. No signs of aphasia or neglect Cranial Nerves: II: he has a left hemianopia. Pupils are equal, round, and reactive to light.   III,IV, VI: EOMI without ptosis or diploplia.  V: Facial sensation is symmetric to temperature VII: Facial movement is symmetric.  VIII: hearing is intact to voice X: Uvula elevates symmetrically XI: Shoulder shrug is symmetric. XII: tongue is midline without atrophy or fasciculations.  Motor: Tone is normal. Bulk is normal. 5/5 strength was present in all four extremities.  Sensory: Sensation is symmetric to light touch and temperature in the arms and legs. Cerebellar: FNF and HKS are intact bilaterally  I have reviewed labs in epic and the results pertinent to this consultation are: LDL 64   I have reviewed the images obtained: MRI brain-right PCA infarct  Impression: 71 year old male with right PCA infarct.  This can be either embolic or thrombotic.  He does have significant thrombotic risk factors, and I would favor vascular imaging.  If he has significant intracranial stenosis, then would favor maximizing secondary risk factors.    Recommendations: 1) agree with dual antiplatelet therapy x3 weeks followed by aspirin monotherapy 2) continue Lipitor 40 mg  nightly 3) maximize diabetes control with goal A1c less than seven 4) hypertension control with goal normotension 5) MRA head and neck   Ritta Slot, MD [facility] [phone]  If 7pm- 7am, please page neurology on call as listed in AMION.

## 2022-01-05 NOTE — Progress Notes (Signed)
PROGRESS NOTE    Gabriel Burton  HEN:[id number] DOB: 1950-09-10 DOA: 01/04/2022 PCP: Sallyanne Kuster, NP  Outpatient Specialists: cardiology    Brief Narrative:   From admission h and p Gabriel Burton is a 71 y.o. male with medical history significant for DM, HTN, CKD 3a, HLD, known LBBB who presents to the ED on referral from his ophthalmologist with visual disturbance.  States he awoke 3 days ago, on Monday morning with left-sided visual field deficit preceded by a week of frequent headaches.  He also had dizziness that was transient. He denies one-sided weakness numbness or tingling in the face or extremities and denies difficulty with speech or swallowing.  Assessment & Plan:   Principal Problem:   Acute CVA (cerebrovascular accident) (HCC) Active Problems:   Diabetes mellitus, type II (HCC)   Hypertension   Hyperlipidemia   Stage 3a chronic kidney disease (HCC)  # CVA, subacute Several days peripheral vision loss left eye. MRI shows acute/subacute left occipital infarct, acute or subacute right cerebellar infarct. Petechial hemorrhage and local edema w/o midline shift. Headache this morning which he says is prolongation of headache that started over he weekend - SLP, PT, OT consults - continue home aspirin; plavix started - LDL 67, atorva increased to 40 by admitting provider - maintain normotension given CVA >48 hours ago - f/u TTE - neuro consulted - maintain tele, no events thus far - STAT CT should HA worsen or change  # T2DM Recent a1c 7.2%, here normoglycemic - semglee 30 for home lantus 40 - SSI  # HTN Here normotensive - resume home spironolactone - cont home coreg, amlodipine  # Chronic pain - cont home robaxin, neurontin  # BPH - cont home flomax   DVT prophylaxis: lovenox Code Status: full Family Communication: none @ bedside  Level of care: Telemetry Medical Status is: Observation, will update if doesn't discharge later  today    Consultants:  neuro  Procedures: none  Antimicrobials:  none    Subjective: Ongoing left peripheral visual loss. Chronic headache unchanged  Objective: Vitals:   01/04/22 2230 01/04/22 2325 01/05/22 0330 01/05/22 0750  BP: (!) 149/75 139/84 128/74 122/71  Pulse: 74 69 61 66  Resp: 14 16 16    Temp:  97.9 F (36.6 C) 97.9 F (36.6 C) 98 F (36.7 C)  TempSrc:   Oral   SpO2: 98% 99% 96% 100%  Weight:      Height:        Intake/Output Summary (Last 24 hours) at 01/05/2022 0935 Last data filed at 01/05/2022 0030 Gross per 24 hour  Intake 360 ml  Output --  Net 360 ml   Filed Weights   01/04/22 1619  Weight: 95.3 kg    Examination:  General exam: Appears calm and comfortable  Respiratory system: Clear to auscultation. Respiratory effort normal. Cardiovascular system: S1 & S2 heard, RRR. No JVD, murmurs, rubs, gallops or clicks. No pedal edema. Gastrointestinal system: Abdomen is nondistended, soft and nontender. No organomegaly or masses felt. Normal bowel sounds heard. Central nervous system: Alert and oriented. No focal neurological deficits. Cn2-12 grossly intact. 5/5 upper/lower strength Extremities: Symmetric 5 x 5 power. Skin: No rashes, lesions or ulcers Psychiatry: Judgement and insight appear normal. Mood & affect appropriate.     Data Reviewed: I have personally reviewed following labs and imaging studies  CBC: Recent Labs  Lab 01/04/22 1623  WBC 6.0  NEUTROABS 3.0  HGB 12.8*  HCT 39.6  MCV 88.0  PLT 201  Basic Metabolic Panel: Recent Labs  Lab 01/04/22 1623  NA 140  K 3.9  CL 106  CO2 26  GLUCOSE 143*  BUN 23  CREATININE 1.65*  CALCIUM 9.7   GFR: Estimated Creatinine Clearance: 49.1 mL/min (A) (by C-G formula based on SCr of 1.65 mg/dL (H)). Liver Function Tests: Recent Labs  Lab 01/04/22 1623  AST 27  ALT 31  ALKPHOS 101  BILITOT 0.4  PROT 8.1  ALBUMIN 4.1   No results for input(s): "LIPASE", "AMYLASE" in  the last 168 hours. No results for input(s): "AMMONIA" in the last 168 hours. Coagulation Profile: Recent Labs  Lab 01/04/22 1623  INR 1.1   Cardiac Enzymes: No results for input(s): "CKTOTAL", "CKMB", "CKMBINDEX", "TROPONINI" in the last 168 hours. BNP (last 3 results) No results for input(s): "PROBNP" in the last 8760 hours. HbA1C: No results for input(s): "HGBA1C" in the last 72 hours. CBG: Recent Labs  Lab 01/04/22 2332 01/05/22 0751  GLUCAP 149* 111*   Lipid Profile: Recent Labs    01/05/22 0525  CHOL 128  HDL 51  LDLCALC 64  TRIG 63  CHOLHDL 2.5   Thyroid Function Tests: No results for input(s): "TSH", "T4TOTAL", "FREET4", "T3FREE", "THYROIDAB" in the last 72 hours. Anemia Panel: No results for input(s): "VITAMINB12", "FOLATE", "FERRITIN", "TIBC", "IRON", "RETICCTPCT" in the last 72 hours. Urine analysis:    Component Value Date/Time   APPEARANCEUR Clear 07/08/2021 1026   LABSPEC 1.010 03/26/2013 1040   GLUCOSEU Negative 07/08/2021 1026   GLUCOSEU 1000 mg/dL 19/50/9326 7124   HGBUR NEGATIVE 03/26/2013 1040   BILIRUBINUR Negative 07/08/2021 1026   PROTEINUR Negative 07/08/2021 1026   PROTEINUR NEGATIVE 03/26/2013 1040   NITRITE Negative 07/08/2021 1026   LEUKOCYTESUR 1+ (A) 07/08/2021 1026   Sepsis Labs: @LABRCNTIP (procalcitonin:4,lacticidven:4)  )No results found for this or any previous visit (from the past 240 hour(s)).       Radiology Studies: Carotid Bilateral (at [facility] and [facility] only)  Result Date: 01/05/2022 CLINICAL DATA:  71 year old male with a history of stroke EXAM: BILATERAL CAROTID DUPLEX ULTRASOUND TECHNIQUE: 78 scale imaging, color Doppler and duplex ultrasound were performed of bilateral carotid and vertebral arteries in the neck. COMPARISON:  None Available. FINDINGS: Criteria: Quantification of carotid stenosis is based on velocity parameters that correlate the residual internal carotid diameter with NASCET-based stenosis levels,  using the diameter of the distal internal carotid lumen as the denominator for stenosis measurement. The following velocity measurements were obtained: RIGHT ICA:  Systolic 74 cm/sec, Diastolic 22 cm/sec CCA:  82 cm/sec SYSTOLIC ICA/CCA RATIO:  21.1 ECA:  77 cm/sec LEFT ICA:  Systolic 75 cm/sec, Diastolic 22 cm/sec CCA:  73 cm/sec SYSTOLIC ICA/CCA RATIO:  1.0 ECA:  82 cm/sec Right Brachial SBP: Not acquired Left Brachial SBP: Not acquired RIGHT CAROTID ARTERY: No significant calcified disease of the right common carotid artery. Intermediate waveform maintained. Homogeneous plaque without significant calcifications at the right carotid bifurcation. Low resistance waveform of the right ICA. No significant tortuosity. RIGHT VERTEBRAL ARTERY: Antegrade flow with low resistance waveform. LEFT CAROTID ARTERY: No significant calcified disease of the left common carotid artery. Intermediate waveform maintained. Homogeneous plaque at the left carotid bifurcation without significant calcifications. Low resistance waveform of the left ICA. LEFT VERTEBRAL ARTERY:  Antegrade flow with low resistance waveform. IMPRESSION: Color duplex indicates minimal homogeneous plaque, with no hemodynamically significant stenosis by duplex criteria in the extracranial cerebrovascular circulation. Signed, Wallace Cullens. Yvone Neu, RPVI [facility] Radiology  Electronically Signed   By: Gilmer Mor D.O.   On: 01/05/2022 08:32   MR Brain W and Wo Contrast  Result Date: 01/04/2022 CLINICAL DATA:  Evaluate occipital mass, left hemianopsia and loss of coordination, abnormal CT EXAM: MRI HEAD WITHOUT AND WITH CONTRAST TECHNIQUE: Multiplanar, multiecho pulse sequences of the brain and surrounding structures were obtained without and with intravenous contrast. CONTRAST:  53mL GADAVIST GADOBUTROL 1 MMOL/ML IV SOLN COMPARISON:  None Available. FINDINGS: Brain: Restricted diffusion with ADC correlate  in the medial right occipital cortex (series 5, images 15-28 and series 7, image 9), in an area measuring approximately 5.1 x 2.7 x 3.9 cm. Additional smaller, subcentimeter, wedge-shaped area of restricted diffusion with ADC correlate in the superomedial right cerebellum (series 5, image 12 and series 7, image 9). The left occipital infarct is associated with hemosiderin deposition (series 13, images 25-33), most likely petechial hemorrhage (PH 2), which correlates with the hypodensity seen on the same-day CT. No hemosiderin deposition is associated with the occipital infarct. Both infarcts are associated with increased T2 signal, with gyral swelling and local mass effect in the medial right occipital lobe. No abnormal parenchymal or meningeal enhancement. No additional area of infarct or hemorrhage. No mass, midline shift, hydrocephalus, or extra-axial collection. Scattered and confluent T2 hyperintense signal in the periventricular white matter and pons, likely the sequela of moderate chronic small vessel ischemic disease. Vascular: Normal arterial flow voids. Normal arterial and venous enhancement. Skull and upper cervical spine: Normal marrow signal. No abnormal enhancement. Sinuses/Orbits: Minimal mucosal thickening in the ethmoid air cells. The orbits are unremarkable. Other: Trace fluid in the right mastoid air cells. IMPRESSION: 1. Medial right occipital cortical infarct, likely late acute to early subacute, with petechial hemorrhage (PH 2). This area is associated with edema and gyral swelling, with local mass effect, but no midline shift. 2. Small area of acute or subacute infarct in the superior right cerebellum. These results were called by telephone at the time of interpretation on 01/04/2022 at 9:53 pm to provider [facility] , who verbally acknowledged these results. Electronically Signed   By: Wiliam Ke M.D.   On: 01/04/2022 21:55   CT HEAD WO CONTRAST  Result Date: 01/04/2022 CLINICAL DATA:   Headaches EXAM: CT HEAD WITHOUT CONTRAST TECHNIQUE: Contiguous axial images were obtained from the base of the skull through the vertex without intravenous contrast. RADIATION DOSE REDUCTION: This exam was performed according to the departmental dose-optimization program which includes automated exposure control, adjustment of the mA and/or kV according to patient size and/or use of iterative reconstruction technique. COMPARISON:  None Available. FINDINGS: Brain: There are no signs of bleeding within the cranium. There is 2.2 cm ill-defined density surrounded by edema in the right parieto-occipital region. Ventricles are not dilated. There is no shift of midline structures. There are no epidural or subdural fluid collections. Vascular: Unremarkable. Skull: No fracture is seen in the calvarium. Sinuses/Orbits: Unremarkable. Other: None. IMPRESSION: There are no signs of bleeding within the cranium. There is 2.2 cm area of subtle increased density with surrounding edema in the right parieto-occipital cortex. Possibility of neoplasm is not excluded. Follow-up MRI without and with contrast enhancement should be considered. There is no dilation of ventricles. There is no shift of midline structures. Electronically Signed   By: Ernie Avena M.D.   On: 01/04/2022 17:28        Scheduled Meds:   stroke: early stages of recovery book   Does not apply Once  amLODipine  10 mg Oral Daily   aspirin EC  81 mg Oral Daily   atorvastatin  40 mg Oral QHS   carvedilol  18.75 mg Oral BID WC   clopidogrel  75 mg Oral Daily   enoxaparin (LOVENOX) injection  40 mg Subcutaneous Q24H   gabapentin  100 mg Oral Daily   lisinopril  40 mg Oral Daily   And   hydrochlorothiazide  25 mg Oral Daily   insulin aspart  0-15 Units Subcutaneous TID WC   insulin aspart  0-5 Units Subcutaneous QHS   tamsulosin  0.4 mg Oral Daily   Continuous Infusions:   LOS: 0 days     Silvano Bilis, MD [facility]   If  7PM-7AM, please contact night-coverage [email or web address] Password [facility] 01/05/2022, 9:35 AM

## 2022-01-05 NOTE — Evaluation (Addendum)
Speech Language Pathology Evaluation Patient Details Name: ISABELLA IDA MRN: [id number] DOB: 09/14/1950 Today's Date: 01/05/2022 Time: 2440-1027 SLP Time Calculation (min) (ACUTE ONLY): 25 min  Problem List:  Patient Active Problem List   Diagnosis Date Noted   Acute CVA (cerebrovascular accident) (HCC) 01/04/2022   Stage 3a chronic kidney disease (HCC) 01/04/2022   Hepatomegaly 08/05/2021   Abdominal discomfort 09/29/2020   Hyperlipidemia 06/23/2020   Acute non-recurrent frontal sinusitis 01/27/2020   Allergic rhinitis due to pollen 01/27/2020   LBBB (left bundle branch block) 12/18/2019   First degree AV block 12/18/2019   Gastroesophageal reflux disease without esophagitis 10/28/2019   Irregular heart rhythm 10/28/2019   Flu vaccine need 07/04/2019   Encounter for hepatitis C screening test for low risk patient 07/04/2019   Hyperlipidemia LDL goal <70 12/27/2018   Abnormal stress test 10/08/2018   Chronic left shoulder pain 10/08/2018   Cough 09/22/2018   Acute upper respiratory infection 09/22/2018   Abnormal ECG 09/22/2018   Chest pain 09/22/2018   Encounter for general adult medical examination with abnormal findings 06/09/2018   Dysuria 06/09/2018   Uncontrolled type 2 diabetes mellitus with hyperglycemia (HCC) 03/04/2018   Erectile dysfunction 03/04/2018   Vasomotor rhinitis 01/24/2018   Type 2 diabetes mellitus with hyperglycemia (HCC) 01/24/2018   Diabetes mellitus, type II (HCC) 11/11/2017   Sore throat 11/11/2017   Acute non-recurrent pansinusitis 11/11/2017   Hypertension 11/11/2017   Past Medical History:  Past Medical History:  Diagnosis Date   Diabetes mellitus without complication (HCC)    Diastolic dysfunction    a. 09/2018 Echo: EF 63%, diast dysfxn. Mild conc LVH. Mild TR.   History of stress test    a. 09/2018 ETT: Ex time 3:50. BP 207/90. Inconclusive 2/2 LBBB.  Stopped 2/2 claudication; b. 12/2018 Lexi MV: fixed septal/apical defect - felt to be  artifact 2/2 LBBB. Very mild cor Ca2+ and Ao atherosclerosis. Low-mod risk study. EF 43%.   Hyperlipidemia    Hypertension    a. 08/2020 Renal artery duplex: No evidence of RAS.   Left bundle branch block    Past Surgical History: History reviewed. No pertinent surgical history. HPI:  Per H&P "NYAIR DEPAULO is a 71 y.o. male with medical history significant for DM, HTN, CKD 3a, HLD, known LBBB who presents to the ED on referral from his ophthalmologist with visual disturbance.  States he awoke 3 days ago, on Monday morning with left-sided visual field deficit preceded by a week of frequent headaches.  He also had dizziness that was transient. He denies one-sided weakness numbness or tingling in the face or extremities and denies difficulty with speech or swallowing.  ED course and data review: BP 156/81 with otherwise normal vitals  Labs: Creatinine at baseline at 1.65, CBC at baseline at 12.8, otherwise CBC, CMP unremarkable.  EtOH less than 10.  EKG, personally viewed and interpreted: Sinus rhythm at 79 with LBBB and no acute ST-T wave changes  Imaging: CT head showed a concerning abnormality with recommendation for MRI  MRI brain with and without contrast showed the following findings::  IMPRESSION:  1. Medial right occipital cortical infarct, likely late acute to  early subacute, with petechial hemorrhage (PH 2). This area is  associated with edema and gyral swelling, with local mass effect,  but no midline shift.     2. Small area of acute or subacute infarct in the superior right  cerebellum."   Assessment / Plan / Recommendation Clinical Impression  Pt  seen for cognitive-linguistic evaluation. Pt alert, pleasant, and cooperative. Endorses visual changes.   Assessment completed via informal means and portions of Cognistat. Pt A&Ox4. Pt speech is fluent, appropriate, and without s/sx dysarthria or anomia. Pt with x1 instance of difficulty following conversation (auditory comprehension); however,  improved with extra time and repetition of stimuli. Otherwise, auditory comprehension was [facility]. Per selected subtests of Cognistat, pt with mild difficulty with short term recall (Memory subtest; 4 word delayed recall, ~5 minutes lapsed; 8/12) and abstract reasoning (Similarities subtest; 6/8). Pt with reduced functional tracking/scanning (L ?field cut; will defer visual assessment to OT) affecting pt's ability to locate information on menu. Additionally, ?pt's insight into CLOF given pt's report of continuing to drive while experiencing visual changes PTA. Evaluation d/c'd prematurely due pt awaiting echocardiogram. Further diagnostic assessment warranted.  Recommend ongoing SLP services acute and post-acute for cognitive-linguistic deficits. Anticipate need for at least intermittent supervision/assistance at d/c given above mentioned deficits. No driving unless cleared by MD and initial assistance with medication/financial management.   SLP to continue to follow while pt in house for cognitive-linguistic tx.   Pt and RN made aware of results, recommendations, and SLP POC. Pt verbalized understanding/agreement.   Pt left in care of echo tech.    SLP Assessment  SLP Recommendation/Assessment: Patient needs continued Speech Lanaguage Pathology Services SLP Visit Diagnosis: Cognitive communication deficit (R41.841)    Recommendations for follow up therapy are one component of a multi-disciplinary discharge planning process, led by the attending physician.  Recommendations may be updated based on patient status, additional functional criteria and insurance authorization.    Follow Up Recommendations  Outpatient SLP services   Assistance Recommended at Discharge  Intermittent Supervision/Assistance  Functional Status Assessment Patient has had a recent decline in their functional status and demonstrates the ability to make significant improvements in function in a reasonable and predictable amount of  time.  Frequency and Duration min 2x/week  2 weeks      SLP Evaluation Cognition  Overall Cognitive Status: Impaired/Different from baseline Arousal/Alertness: Awake/alert Orientation Level: Oriented X4 Attention:  (visual attention; reduced on L) Memory: Impaired Memory Impairment: Retrieval deficit;Decreased recall of new information Awareness: Appears intact Problem Solving: Appears intact (verbal) Executive Function: Reasoning Reasoning: Impaired Reasoning Impairment: Verbal basic Safety/Judgment: Impaired (continued driving with visual changes PTA)       Comprehension  Auditory Comprehension Overall Auditory Comprehension: Appears within functional limits for tasks assessed Yes/No Questions: Within Functional Limits Commands: Within Functional Limits Conversation: Complex (with extra time; x1 instance of benefiting from repetition of stimuli) Visual Recognition/Discrimination Discrimination: Within Function Limits Reading Comprehension Reading Status: Not tested (extra time to locate items from menu; unable to find number to call Dietary on menu; likely due to visual changes)    Expression Expression Primary Mode of Expression: Verbal Verbal Expression Overall Verbal Expression: Appears within functional limits for tasks assessed Initiation: No impairment Automatic Speech:  (WFL) Level of Generative/Spontaneous Verbalization: Conversation [facility]) Repetition: No impairment Naming: No impairment Pragmatics: No impairment Written Expression Written Expression: Not tested   Oral / Motor  Oral Motor/Sensory Function Overall Oral Motor/Sensory Function: Within functional limits Motor Speech Overall Motor Speech: Appears within functional limits for tasks assessed Respiration: Within functional limits Phonation: Normal Resonance: Within functional limits Articulation: Within functional limitis Intelligibility: Intelligible Motor Planning: Witnin functional  limits Motor Speech Errors: Not applicable           Clyde Canterbury, M.S., CCC-SLP Speech-Language Pathologist [facility]  Center [phone] Arnette Felts)   Woodroe Chen 01/05/2022, 9:28 AM

## 2022-01-05 NOTE — Evaluation (Addendum)
Physical Therapy Evaluation Patient Details Name: Gabriel Burton MRN: [id number] DOB: 23-Jan-1951 Today's Date: 01/05/2022  History of Present Illness  Patient is a 71 year old male with  medical history significant for DM, HTN, CKD, HLD, known LBBB who presents to the ED on referral from his ophthalmologist with visual disturbance. MRI reports medial right occipital cortical infarct, likely late acute to early subacute, with petechial hemorrhage, edema and gyral swelling, with local mass effect, but no midline shift and small area of acute or subacute infarct in the superior right cerebellum.  Clinical Impression  Patient agreeable to PT evaluation. He reports he was independent without assistive device for mobility at baseline and lives home alone.  The patient is supervision to Mod I with all functional mobility tasks. He did require initial verbal cues to scan environment with ambulation to avoid obstacles on the left due to vision impairments. He demonstrated correct strategy. Gait is steady without assistive device and patient demonstrated no loss of balance with higher level balance tasks of reaching outside base of support, direction changes, and sudden stops with ambulation. PT will follow while in the hospital to maximize independence and safety awareness in preparation for discharge home. I do no anticipate PT needs at this time, but I would recommend outpatient OT follow up for vision deficits and driving testing.        Recommendations for follow up therapy are one component of a multi-disciplinary discharge planning process, led by the attending physician.  Recommendations may be updated based on patient status, additional functional criteria and insurance authorization.  Follow Up Recommendations No PT follow up (recommend outpatient OT for vision deficits and driving)      Assistance Recommended at Discharge None  Patient can return home with the following  Assist for  transportation    Equipment Recommendations None recommended by PT  Recommendations for Other Services       Functional Status Assessment Patient has had a recent decline in their functional status and demonstrates the ability to make significant improvements in function in a reasonable and predictable amount of time.     Precautions / Restrictions Precautions Precautions: Fall Restrictions Weight Bearing Restrictions: No      Mobility  Bed Mobility               General bed mobility comments: not assessed as patient sitting up on arrival and post session    Transfers Overall transfer level: Needs assistance Equipment used: None Transfers: Sit to/from Stand Sit to Stand: Supervision           General transfer comment: good safety awareness demonstrated with transfers without verbal cues    Ambulation/Gait Ambulation/Gait assistance: Supervision Gait Distance (Feet): 175 Feet Assistive device: None Gait Pattern/deviations: Step-through pattern Gait velocity: normal     General Gait Details: no loss of balance with ambulation. initial occasional cues for safety for left visual field deficits to avoid running into objects on the left. patient compensated by demonstrating understanding of scanning the environment for safety without difficulty.  Stairs            Wheelchair Mobility    Modified Rankin (Stroke Patients Only)       Balance Overall balance assessment: Needs assistance Sitting-balance support: Feet supported Sitting balance-Leahy Scale: Good     Standing balance support: During functional activity, No upper extremity supported Standing balance-Leahy Scale: Good Standing balance comment: no external support required with dyanmic standing activity  High level balance activites: Direction changes, Sudden stops, Turns (reaching outside base of support with single UE while performing a functional task) High Level Balance  Comments: no loss of balance demonstrated             Pertinent Vitals/Pain Pain Assessment Pain Assessment: No/denies pain    Home Living Family/patient expects to be discharged to:: Private residence Living Arrangements: Alone Available Help at Discharge: Family;Available PRN/intermittently Type of Home: House Home Access: Stairs to enter Entrance Stairs-Rails: Doctor, general practice of Steps: 3   Home Layout: One level        Prior Function Prior Level of Function : Independent/Modified Independent             Mobility Comments: independent without assistive device ADLs Comments: independent     Hand Dominance        Extremity/Trunk Assessment   Upper Extremity Assessment Upper Extremity Assessment: Overall WFL for tasks assessed    Lower Extremity Assessment Lower Extremity Assessment: RLE deficits/detail;LLE deficits/detail RLE Deficits / Details: 5/5 dorsiflexion, plantarflexion, hip flexion, knee extension, hip add/abd RLE Sensation: WNL RLE Coordination: WNL LLE Deficits / Details: 5/5 dorsiflexion, plantarflexion, hip flexion, knee extension, hip add/abd LLE Sensation: WNL LLE Coordination: WNL       Communication   Communication: No difficulties  Cognition Arousal/Alertness: Awake/alert Behavior During Therapy: WFL for tasks assessed/performed Overall Cognitive Status: Within Functional Limits for tasks assessed                                 General Comments: patient is mildly delayed with following commands but follows all commands consistently.        General Comments      Exercises     Assessment/Plan    PT Assessment Patient needs continued PT services  PT Problem List Decreased safety awareness;Decreased mobility;Decreased activity tolerance       PT Treatment Interventions DME instruction;Gait training;Stair training;Functional mobility training;Therapeutic exercise;Therapeutic  activities;Balance training;Neuromuscular re-education;Patient/family education    PT Goals (Current goals can be found in the Care Plan section)  Acute Rehab PT Goals Patient Stated Goal: to return home PT Goal Formulation: With patient Time For Goal Achievement: 01/19/22 Potential to Achieve Goals: Good    Frequency Min 2X/week     Co-evaluation               AM-PAC PT "6 Clicks" Mobility  Outcome Measure Help needed turning from your back to your side while in a flat bed without using bedrails?: None Help needed moving from lying on your back to sitting on the side of a flat bed without using bedrails?: None Help needed moving to and from a bed to a chair (including a wheelchair)?: None Help needed standing up from a chair using your arms (e.g., wheelchair or bedside chair)?: None Help needed to walk in hospital room?: None Help needed climbing 3-5 steps with a railing? : None 6 Click Score: 24    End of Session   Activity Tolerance: Patient tolerated treatment well Patient left: in chair;with call bell/phone within reach;with nursing/sitter in room Nurse Communication: Mobility status PT Visit Diagnosis: Unsteadiness on feet (R26.81);Other (comment);Difficulty in walking, not elsewhere classified (R26.2) (vision impairment)    Time: 9323-5573 PT Time Calculation (min) (ACUTE ONLY): 16 min   Charges:   PT Evaluation $PT Eval Low Complexity: 1 Low PT Treatments $Gait Training: 8-22 mins  Donna Bernard, PT, MPT   Ina Homes 01/05/2022, 11:49 AM

## 2022-01-05 NOTE — Clinical Social Work Note (Signed)
Occupational Therapy * Physical Therapy * Speech Therapy          DATE ____6/22/23_______________ PATIENT NAME_____[name]________________ PATIENT MRN____[id number]________________  DIAGNOSIS/DIAGNOSIS CODE __Acute CVA I63.9____________________ DATE OF DISCHARGE: ____6/23/23__________  PRIMARY CARE PHYSICIAN ___Alyssa Abernathy____________________ PCP PHONE/FAX________[phone]___________________     Dear Provider (Name: __________________   Fax: ___________________________):   I certify that I have examined this patient and that occupational/physical/speech therapy is necessary on an outpatient basis.    The patient has expressed interest in completing their recommended course of therapy at your location.  Once a formal order from the patient's primary care physician has been obtained, please contact him/her to schedule an appointment for evaluation at your earliest convenience.   [  ]  Physical Therapy Evaluate and Treat          [ X ]  Occupational Therapy Evaluate and Treat                                    [  ]  Speech Therapy Evaluate and Treat       The patient's primary care physician (listed above) must furnish and be responsible for a formal order such that the recommended services may be furnished while under the primary physician's care, and that the plan of care will be established and reviewed every 30 days (or more often if condition necessitates).

## 2022-01-06 ENCOUNTER — Encounter: Payer: Self-pay | Admitting: Nurse Practitioner

## 2022-01-06 LAB — HIV ANTIBODY (ROUTINE TESTING W REFLEX): HIV Screen 4th Generation wRfx: NONREACTIVE

## 2022-01-09 ENCOUNTER — Other Ambulatory Visit: Payer: Self-pay | Admitting: Nurse Practitioner

## 2022-01-09 ENCOUNTER — Telehealth: Payer: Self-pay

## 2022-01-09 DIAGNOSIS — Z8673 Personal history of transient ischemic attack (TIA), and cerebral infarction without residual deficits: Secondary | ICD-10-CM

## 2022-01-09 NOTE — Telephone Encounter (Signed)
Urgent Rehab referral faxed to [facility]

## 2022-01-10 ENCOUNTER — Ambulatory Visit (INDEPENDENT_AMBULATORY_CARE_PROVIDER_SITE_OTHER): Payer: BC Managed Care – PPO

## 2022-01-10 DIAGNOSIS — I6389 Other cerebral infarction: Secondary | ICD-10-CM

## 2022-01-10 DIAGNOSIS — I639 Cerebral infarction, unspecified: Secondary | ICD-10-CM

## 2022-01-13 DIAGNOSIS — I6389 Other cerebral infarction: Secondary | ICD-10-CM

## 2022-01-13 DIAGNOSIS — I639 Cerebral infarction, unspecified: Secondary | ICD-10-CM

## 2022-01-18 ENCOUNTER — Other Ambulatory Visit: Payer: Self-pay | Admitting: Internal Medicine

## 2022-01-25 ENCOUNTER — Ambulatory Visit: Payer: BC Managed Care – PPO | Admitting: Nurse Practitioner

## 2022-01-25 ENCOUNTER — Encounter: Payer: Self-pay | Admitting: Nurse Practitioner

## 2022-01-25 ENCOUNTER — Other Ambulatory Visit: Payer: Self-pay | Admitting: Obstetrics and Gynecology

## 2022-01-25 VITALS — BP 141/79 | HR 70 | Temp 98.0°F | Resp 16 | Ht 74.0 in | Wt 208.8 lb

## 2022-01-25 DIAGNOSIS — H53452 Other localized visual field defect, left eye: Secondary | ICD-10-CM

## 2022-01-25 DIAGNOSIS — Z794 Long term (current) use of insulin: Secondary | ICD-10-CM

## 2022-01-25 DIAGNOSIS — E1165 Type 2 diabetes mellitus with hyperglycemia: Secondary | ICD-10-CM | POA: Diagnosis not present

## 2022-01-25 DIAGNOSIS — N1831 Chronic kidney disease, stage 3a: Secondary | ICD-10-CM | POA: Diagnosis not present

## 2022-01-25 DIAGNOSIS — Z8673 Personal history of transient ischemic attack (TIA), and cerebral infarction without residual deficits: Secondary | ICD-10-CM

## 2022-01-25 LAB — POCT GLYCOSYLATED HEMOGLOBIN (HGB A1C): Hemoglobin A1C: 7.3 % — AB (ref 4.0–5.6)

## 2022-01-25 MED ORDER — CLOPIDOGREL BISULFATE 75 MG PO TABS: 75.0000 mg | ORAL_TABLET | Freq: Every day | ORAL | 1 refills | Status: DC

## 2022-01-25 NOTE — Progress Notes (Signed)
[facility] [address] [place], [place] [address]  Internal MEDICINE  Office Visit Note  Patient Name: Gabriel Burton  [id number]  [id number]  Date of Service: 01/25/2022  Chief Complaint  Patient presents with   Follow-up    Pt had stroke about 3 weeks ago   Diabetes   Hyperlipidemia   Hypertension    HPI Darvell presents for a follow up visit for diabetes, hypertension and recent stroke. He was hospitalized for a stroke that he experienced approximately 3 weeks ago. He needs to follow up with neurology but the hospital did not call to make the referral He has lost some peripheral vision on the left side, his balance has been effected as well He reports that the stroke happened in his sleep His a1c is 7.3 today which is no significant change from April when his a1c was 7.2 Also needs refills of the new medications that he was started on in the hospital.    Current Medication: Outpatient Encounter Medications as of 01/25/2022  Medication Sig   amLODipine (NORVASC) 10 MG tablet Take 1 tablet (10 mg total) by mouth daily.   aspirin EC 81 MG tablet Take 81 mg by mouth daily.    atorvastatin (LIPITOR) 10 MG tablet Take 1 tablet (10 mg total) by mouth at bedtime.   carvedilol (COREG) 12.5 MG tablet Take 1.5 tablets (18.75 mg total) by mouth 2 (two) times daily with a meal.   Cholecalciferol (VITAMIN D3) 5000 units TABS Take 5,000 Units by mouth daily.    CINNAMON PO Take by mouth daily.    fluticasone (FLONASE) 50 MCG/ACT nasal spray Place 2 sprays into both nostrils daily.   gabapentin (NEURONTIN) 100 MG capsule Take 1 capsule (100 mg total) by mouth daily.   lisinopril-hydrochlorothiazide (ZESTORETIC) 20-12.5 MG tablet TAKE 2 TABLETS BY MOUTH ONCE DAILY FOR HIGH BLOOD PRESSURE   methocarbamol (ROBAXIN) 500 MG tablet Take 500 mg by mouth 3 (three) times daily as needed for muscle spasms.   nitroGLYCERIN (NITROSTAT) 0.4 MG SL tablet DISSOLVE ONE TABLET UNDER THE TONGUE  EVERY 5 MINUTES AS NEEDED FOR CHEST PAIN.  DO NOT EXCEED A TOTAL OF 3 DOSES IN 15 MINUTES   pantoprazole (PROTONIX) 40 MG tablet Take 1 tablet (40 mg total) by mouth daily.   Semaglutide,0.25 or 0.5MG /DOS, 2 MG/1.5ML SOPN Inject 0.5 mg into the skin once a week.   spironolactone (ALDACTONE) 25 MG tablet Take 1 tablet by mouth once daily   tadalafil (CIALIS) 20 MG tablet Take 1 tablet (20 mg total) by mouth daily as needed for erectile dysfunction.   tamsulosin (FLOMAX) 0.4 MG CAPS capsule TAKE 1 CAPSULE BY MOUTH ONE-HALF HOUR AFTER THE SAME MEAL EACH DAY   TOUJEO SOLOSTAR 300 UNIT/ML Solostar Pen Inject 40 units [place] daily. May increase up to 45 units as indicated.   TRUEPLUS 5-BEVEL PEN NEEDLES 32G X 4 MM MISC USE  TWICE DAILY AS DIRECTED   vitamin C (ASCORBIC ACID) 500 MG tablet Take 500 mg by mouth daily.    [DISCONTINUED] clopidogrel (PLAVIX) 75 MG tablet Take 1 tablet (75 mg total) by mouth daily.   clopidogrel (PLAVIX) 75 MG tablet Take 1 tablet (75 mg total) by mouth daily.   [DISCONTINUED] benzonatate (TESSALON) 100 MG capsule Take 1 capsule (100 mg total) by mouth 2 (two) times daily as needed for cough. (Patient not taking: Reported on 01/04/2022)   No facility-administered encounter medications on file as of 01/25/2022.    Surgical History:  History reviewed. No pertinent surgical history.  Medical History: Past Medical History:  Diagnosis Date   Diabetes mellitus without complication (HCC)    Diastolic dysfunction    a. 09/2018 Echo: EF 63%, diast dysfxn. Mild conc LVH. Mild TR.   History of stress test    a. 09/2018 ETT: Ex time 3:50. BP 207/90. Inconclusive 2/2 LBBB.  Stopped 2/2 claudication; b. 12/2018 Lexi MV: fixed septal/apical defect - felt to be artifact 2/2 LBBB. Very mild cor Ca2+ and Ao atherosclerosis. Low-mod risk study. EF 43%.   Hyperlipidemia    Hypertension    a. 08/2020 Renal artery duplex: No evidence of RAS.   Left bundle branch block    Stroke [facility])      Family History: Family History  Problem Relation Age of Onset   Hypertension Mother    Hypertension Father    Heart attack Brother 57    Social History   Socioeconomic History   Marital status: Widowed    Spouse name: Not on file   Number of children: Not on file   Years of education: Not on file   Highest education level: Not on file  Occupational History   Not on file  Tobacco Use   Smoking status: Former    Packs/day: 0.40    Years: 8.00    Total pack years: 3.20    Types: Cigarettes    Quit date: 2000    Years since quitting: 23.5   Smokeless tobacco: Never  Vaping Use   Vaping Use: Never used  Substance and Sexual Activity   Alcohol use: Never   Drug use: Never   Sexual activity: Not on file  Other Topics Concern   Not on file  Social History Narrative   Not on file   Social Determinants of Health   Financial Resource Strain: Not on file  Food Insecurity: Not on file  Transportation Needs: Not on file  Physical Activity: Not on file  Stress: Not on file  Social Connections: Not on file  Intimate Partner Violence: Not on file      Review of Systems  Constitutional:  Negative for chills, fatigue and unexpected weight change.  HENT:  Negative for congestion, postnasal drip, rhinorrhea, sneezing and sore throat.   Eyes:  Positive for visual disturbance (loss of some of his peripheral vision). Negative for redness.  Respiratory:  Negative for cough, chest tightness and shortness of breath.   Cardiovascular:  Negative for chest pain and palpitations.  Gastrointestinal:  Negative for abdominal pain, constipation, diarrhea, nausea and vomiting.  Genitourinary:  Negative for dysuria and frequency.  Musculoskeletal:  Negative for arthralgias, back pain, joint swelling and neck pain.  Skin:  Negative for rash.  Neurological: Negative.  Negative for tremors and numbness.  Hematological:  Negative for adenopathy. Does not bruise/bleed easily.   Psychiatric/Behavioral:  Negative for behavioral problems (Depression), sleep disturbance and suicidal ideas. The patient is not nervous/anxious.     Vital Signs: BP (!) 141/79   Pulse 70   Temp 98 F (36.7 C)   Resp 16   Ht 6\' 2"  (1.88 m)   Wt 208 lb 12.8 oz (94.7 kg)   SpO2 98%   BMI 26.81 kg/m    Physical Exam Vitals reviewed.  Constitutional:      General: He is not in acute distress.    Appearance: Normal appearance. He is not ill-appearing.  HENT:     Head: Normocephalic and atraumatic.  Eyes:     Pupils:  Pupils are equal, round, and reactive to light.  Cardiovascular:     Rate and Rhythm: Normal rate and regular rhythm.     Heart sounds: No murmur heard. Pulmonary:     Effort: Pulmonary effort is normal. No respiratory distress.     Breath sounds: Normal breath sounds. No wheezing.  Neurological:     Mental Status: He is alert and oriented to person, place, and time.     Gait: Gait abnormal (has cane).  Psychiatric:        Mood and Affect: Mood normal.        Behavior: Behavior normal.        Assessment/Plan: 1. Type 2 diabetes mellitus with hyperglycemia, with long-term current use of insulin (HCC) No significant change, will follow up in 3 months for repeat A1C  - POCT HgB A1C  2. History of recent stroke Referred to neurology, ischemic stroke 3 weeks ago, was hospitalized. Was told to refer to neurology but no attempt to order and call for referral was made by those discharging the patient from the hospital. Referral ordered. Plavix prescription ordered.  - Ambulatory referral to Neurology - clopidogrel (PLAVIX) 75 MG tablet; Take 1 tablet (75 mg total) by mouth daily.  Dispense: 90 tablet; Refill: 1  3. Decreased peripheral vision of left eye Referred to neurology, visual deficit due to stroke.  - Ambulatory referral to Neurology  4. Stage 3a chronic kidney disease (HCC) Decreased kidney function associated with his diabetes. Continue to monitor  labs.    General Counseling: Yann verbalizes understanding of the findings of todays visit and agrees with plan of treatment. I have discussed any further diagnostic evaluation that may be needed or ordered today. We also reviewed his medications today. he has been encouraged to call the office with any questions or concerns that should arise related to todays visit.    Orders Placed This Encounter  Procedures   Ambulatory referral to Neurology   POCT HgB A1C    Meds ordered this encounter  Medications   clopidogrel (PLAVIX) 75 MG tablet    Sig: Take 1 tablet (75 mg total) by mouth daily.    Dispense:  90 tablet    Refill:  1    Return in about 3 months (around 04/27/2022) for F/U, Recheck A1C, Kohan Azizi PCP.   Total time spent:30 Minutes Time spent includes review of chart, medications, test results, and follow up plan with the patient.   [place] Controlled Substance Database was reviewed by me.  This patient was seen by Sallyanne Kuster, FNP-C in collaboration with Dr. Beverely Risen as a part of collaborative care agreement.   Zniya Cottone R. Tedd Sias, MSN, FNP-C Internal medicine

## 2022-01-27 ENCOUNTER — Telehealth: Payer: Self-pay

## 2022-01-27 NOTE — Telephone Encounter (Signed)
Urgent Neurology referral sent via [facility] to [facility] 

## 2022-02-06 ENCOUNTER — Ambulatory Visit: Payer: BC Managed Care – PPO | Admitting: Nurse Practitioner

## 2022-02-09 ENCOUNTER — Telehealth: Payer: Self-pay

## 2022-02-09 NOTE — Telephone Encounter (Signed)
Neurology referral appointment>> 04/04/22-Gabriel Burton

## 2022-03-01 ENCOUNTER — Other Ambulatory Visit: Payer: Self-pay | Admitting: Nurse Practitioner

## 2022-03-02 ENCOUNTER — Other Ambulatory Visit: Payer: Self-pay | Admitting: Internal Medicine

## 2022-03-02 ENCOUNTER — Other Ambulatory Visit: Payer: Self-pay

## 2022-03-02 MED ORDER — AMLODIPINE BESYLATE 10 MG PO TABS: 10.0000 mg | ORAL_TABLET | Freq: Every day | ORAL | 1 refills | Status: DC

## 2022-03-05 ENCOUNTER — Encounter: Payer: Self-pay | Admitting: Nurse Practitioner

## 2022-03-08 ENCOUNTER — Ambulatory Visit: Payer: BC Managed Care – PPO | Admitting: Internal Medicine

## 2022-03-10 ENCOUNTER — Ambulatory Visit: Payer: BC Managed Care – PPO | Admitting: Internal Medicine

## 2022-04-06 ENCOUNTER — Encounter: Payer: Self-pay | Admitting: Internal Medicine

## 2022-04-06 ENCOUNTER — Ambulatory Visit: Payer: BC Managed Care – PPO | Attending: Internal Medicine | Admitting: Internal Medicine

## 2022-04-06 VITALS — BP 120/70 | HR 73 | Ht 74.0 in | Wt 211.6 lb

## 2022-04-06 DIAGNOSIS — I10 Essential (primary) hypertension: Secondary | ICD-10-CM | POA: Diagnosis not present

## 2022-04-06 DIAGNOSIS — E785 Hyperlipidemia, unspecified: Secondary | ICD-10-CM | POA: Diagnosis not present

## 2022-04-06 DIAGNOSIS — I447 Left bundle-branch block, unspecified: Secondary | ICD-10-CM

## 2022-04-06 DIAGNOSIS — I639 Cerebral infarction, unspecified: Secondary | ICD-10-CM | POA: Diagnosis not present

## 2022-04-06 NOTE — Patient Instructions (Addendum)
Medication Instructions:  Your Physician recommend you continue on your current medication as directed.    *If you need a refill on your cardiac medications before your next appointment, please call your pharmacy*   Lab Work: None ordered today   Testing/Procedures: None ordered today   Follow-Up: At [facility], you and your health needs are our priority.  As part of our continuing mission to provide you with exceptional heart care, we have created designated Provider Care Teams.  These Care Teams include your primary Cardiologist (physician) and Advanced Practice Providers (APPs -  Physician Assistants and Nurse Practitioners) who all work together to provide you with the care you need, when you need it.  We recommend signing up for the patient portal called "MyChart".  Sign up information is provided on this After Visit Summary.  MyChart is used to connect with patients for Virtual Visits (Telemedicine).  Patients are able to view lab/test results, encounter notes, upcoming appointments, etc.  Non-urgent messages can be sent to your provider as well.   To learn more about what you can do with MyChart, go to [email or web address].    Your next appointment:   6 month(s)  The format for your next appointment:   In Person  Provider:   [name], MD    Your physician recommends that you schedule a follow-up appointment with Dr. Quentin Ore for possible loop recorder.

## 2022-04-06 NOTE — Progress Notes (Signed)
Follow-up Outpatient Visit Date: 04/06/2022  Primary Care Provider: Sallyanne Kuster, NP [address] [place] [address]  Chief Complaint: Follow-up hypertension and recent stroke  HPI:  [name] is a 71 y.o. male with history of hypertension, hyperlipidemia, type 2 diabetes mellitus, and chronic left bundle branch block, who presents for follow-up of resistant hypertension.  He was last seen in our office by Cadence Furth, [place], in February, at which time he was feeling well with only rare chest discomfort that he attributed to indigestion and no medication changes or additional testing were pursued.  He was hospitalized in June with left-sided visual field deficit preceded by 1 week of frequent headaches and transient dizziness.  He was found to have acute/subacute left right occipital cortical and right superior cerebellar infarcts.  No significant cervical and intracranial carotid disease was identified by Doppler and MRI.  Today, Mr. Sitter reports that he still has some vision loss consistent with a left upper quadrantanopsia.  His balance is also somewhat off.  He has not had any significant headaches.  He denies chest pain, shortness of breath, palpitations, or edema.  --------------------------------------------------------------------------------------------------  Past Medical History:  Diagnosis Date   Diabetes mellitus without complication (HCC)    Diastolic dysfunction    a. 09/2018 Echo: EF 63%, diast dysfxn. Mild conc LVH. Mild TR.   History of stress test    a. 09/2018 ETT: Ex time 3:50. BP 207/90. Inconclusive 2/2 LBBB.  Stopped 2/2 claudication; b. 12/2018 Lexi MV: fixed septal/apical defect - felt to be artifact 2/2 LBBB. Very mild cor Ca2+ and Ao atherosclerosis. Low-mod risk study. EF 43%.   Hyperlipidemia    Hypertension    a. 08/2020 Renal artery duplex: No evidence of RAS.   Left bundle branch block    Stroke [facility])    History reviewed. No pertinent surgical  history.  Current Meds  Medication Sig   amLODipine (NORVASC) 10 MG tablet Take 1 tablet (10 mg total) by mouth daily.   aspirin EC 81 MG tablet Take 81 mg by mouth daily.    atorvastatin (LIPITOR) 10 MG tablet Take 1 tablet (10 mg total) by mouth at bedtime.   carvedilol (COREG) 12.5 MG tablet Take 1.5 tablets (18.75 mg total) by mouth 2 (two) times daily with a meal.   Cholecalciferol (VITAMIN D3) 5000 units TABS Take 5,000 Units by mouth daily.    CINNAMON PO Take by mouth daily.    clopidogrel (PLAVIX) 75 MG tablet Take 1 tablet (75 mg total) by mouth daily.   fluticasone (FLONASE) 50 MCG/ACT nasal spray Place 2 sprays into both nostrils daily.   gabapentin (NEURONTIN) 100 MG capsule Take 1 capsule (100 mg total) by mouth daily.   lisinopril-hydrochlorothiazide (ZESTORETIC) 20-12.5 MG tablet TAKE 2 TABLETS BY MOUTH ONCE DAILY FOR HIGH BLOOD PRESSURE   methocarbamol (ROBAXIN) 500 MG tablet Take 500 mg by mouth 3 (three) times daily as needed for muscle spasms.   nitroGLYCERIN (NITROSTAT) 0.4 MG SL tablet DISSOLVE ONE TABLET UNDER THE TONGUE EVERY 5 MINUTES AS NEEDED FOR CHEST PAIN.  DO NOT EXCEED A TOTAL OF 3 DOSES IN 15 MINUTES   pantoprazole (PROTONIX) 40 MG tablet Take 1 tablet (40 mg total) by mouth daily.   Semaglutide,0.25 or 0.5MG /DOS, 2 MG/1.5ML SOPN Inject 0.5 mg into the skin once a week.   spironolactone (ALDACTONE) 25 MG tablet Take 1 tablet by mouth once daily   tadalafil (CIALIS) 20 MG tablet Take 1 tablet (20 mg total) by  mouth daily as needed for erectile dysfunction.   tamsulosin (FLOMAX) 0.4 MG CAPS capsule TAKE 1 CAPSULE BY MOUTH ONE-HALF HOUR AFTER THE SAME MEAL EACH DAY   TOUJEO SOLOSTAR 300 UNIT/ML Solostar Pen Inject 40 units [place] daily. May increase up to 45 units as indicated.   TRUEPLUS 5-BEVEL PEN NEEDLES 32G X 4 MM MISC USE  TWICE DAILY AS DIRECTED   vitamin C (ASCORBIC ACID) 500 MG tablet Take 500 mg by mouth daily.     Allergies: Patient has no known  allergies.  Social History   Tobacco Use   Smoking status: Former    Packs/day: 0.40    Years: 8.00    Total pack years: 3.20    Types: Cigarettes    Quit date: 2000    Years since quitting: 23.7   Smokeless tobacco: Never  Vaping Use   Vaping Use: Never used  Substance Use Topics   Alcohol use: Never   Drug use: Never    Family History  Problem Relation Age of Onset   Hypertension Mother    Hypertension Father    Heart attack Brother 42    Review of Systems: A 12-system review of systems was performed and was negative except as noted in the HPI.  --------------------------------------------------------------------------------------------------  Physical Exam: BP 120/70 (BP Location: Left Arm, Patient Position: Sitting, Cuff Size: Large)   Pulse 73   Ht 6\' 2"  (1.88 m)   Wt 211 lb 9.6 oz (96 kg)   SpO2 97%   BMI 27.17 kg/m   General:  NAD. Neck: No JVD or HJR. Lungs: Clear to auscultation bilaterally without wheezes or crackles. Heart: Regular rate and rhythm without murmurs, rubs, or gallops. Abdomen: Soft, nontender, nondistended. Extremities: No lower extremity edema.  EKG: Normal sinus rhythm with first-degree AV block, left axis deviation, and left bundle branch block.  PR interval is slightly longer compared with 01/04/2022 (230->250 ms).  Otherwise, no significant interval change.  Lab Results  Component Value Date   WBC 6.0 01/04/2022   HGB 12.8 (L) 01/04/2022   HCT 39.6 01/04/2022   MCV 88.0 01/04/2022   PLT 201 01/04/2022    Lab Results  Component Value Date   NA 140 01/04/2022   K 3.9 01/04/2022   CL 106 01/04/2022   CO2 26 01/04/2022   BUN 23 01/04/2022   CREATININE 1.65 (H) 01/04/2022   GLUCOSE 143 (H) 01/04/2022   ALT 31 01/04/2022    Lab Results  Component Value Date   CHOL 128 01/05/2022   HDL 51 01/05/2022   LDLCALC 64 01/05/2022   TRIG 63 01/05/2022   CHOLHDL 2.5 01/05/2022     --------------------------------------------------------------------------------------------------  ASSESSMENT AND PLAN: Cryptogenic stroke: Patient had new headaches and left hemianopsia from multifocal strokes involving the left occipital cortex and left cerebellum.  His vision has improved but is not back to normal.  He is currently on aspirin, clopidogrel, and statin therapy.  Echocardiogram did not reveal any structural abnormalities at the time of his stroke.  Subsequent 14-day event monitor did not show any atrial fibrillation/flutter.  I remain concerned about potential for cardioembolic event given multifocal lesions and relatively benign carotid Doppler and head/neck MRA.  I think it would be reasonable to pursue loop recorder implantation +/- TEE.  Mr. Spieler is agreeable to ILR placement but is reluctant to proceed with TEE at this time.  I think that is reasonable for now.  I will reach out to his neurologist, Dr. Montez Morita, regarding his thoughts  about ILR +/- TEE.  We have tentatively referred Mr. Kading to EP for ILR placement and follow-up.  For now, we will continue dual antiplatelet therapy with aspirin and clopidogrel under the direction of neurology, as well as statin therapy.  Hypertension: Blood pressure well controlled today.  We will continue his current medications though we may need to consider de-escalation of carvedilol if PR interval continues to lengthen.  Hyperlipidemia: LDL well controlled on last check in 12/2021.  Continue low-dose atorvastatin.  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  No symptoms of high grade AV block reported.  Continue close monitoring of PR interval with a low threshold for decreasing/stopping carvedilol.  Follow-up: Return to clinic in 6 months.  [name], MD 04/06/2022 2:07 PM

## 2022-04-07 ENCOUNTER — Telehealth: Payer: Self-pay | Admitting: Internal Medicine

## 2022-04-07 NOTE — Telephone Encounter (Signed)
I spoke with Dr. Manuella Ghazi this morning regarding concerns for cardioembolic phenomenon leading to cryptogenic stroke.  He agrees that it would be worthwhile to move forward with implantable loop recorder placement.  Gabriel Burton is scheduled to see Dr. Quentin Ore on 05/17/2022, at which time ILR could be implanted if Dr. Quentin Ore agrees that it is appropriate.  I spoke with Gabriel Burton about our recommendations and he plans to move forward with his consultation with Dr. Quentin Ore as scheduled.  He should continue his current medications.  I asked him to contact us if any questions or concerns arise in the meantime.  [name], MD [facility]

## 2022-04-20 ENCOUNTER — Other Ambulatory Visit: Payer: Self-pay | Admitting: Nurse Practitioner

## 2022-04-20 DIAGNOSIS — M25512 Pain in left shoulder: Secondary | ICD-10-CM

## 2022-04-20 DIAGNOSIS — M542 Cervicalgia: Secondary | ICD-10-CM

## 2022-04-21 ENCOUNTER — Telehealth: Payer: Self-pay

## 2022-04-21 NOTE — Telephone Encounter (Addendum)
PA on 04/21/22 waiting for answer on Tramadol 12mcg.    PA was approved, Pt notified.  [id number]

## 2022-04-27 ENCOUNTER — Ambulatory Visit: Payer: BC Managed Care – PPO | Admitting: Nurse Practitioner

## 2022-04-27 ENCOUNTER — Encounter: Payer: Self-pay | Admitting: Nurse Practitioner

## 2022-04-27 VITALS — BP 133/72 | HR 76 | Temp 98.2°F | Resp 16 | Ht 74.0 in | Wt 211.0 lb

## 2022-04-27 DIAGNOSIS — K219 Gastro-esophageal reflux disease without esophagitis: Secondary | ICD-10-CM

## 2022-04-27 DIAGNOSIS — Z794 Long term (current) use of insulin: Secondary | ICD-10-CM | POA: Diagnosis not present

## 2022-04-27 DIAGNOSIS — Z760 Encounter for issue of repeat prescription: Secondary | ICD-10-CM | POA: Diagnosis not present

## 2022-04-27 DIAGNOSIS — M25512 Pain in left shoulder: Secondary | ICD-10-CM

## 2022-04-27 DIAGNOSIS — E1169 Type 2 diabetes mellitus with other specified complication: Secondary | ICD-10-CM

## 2022-04-27 DIAGNOSIS — E1140 Type 2 diabetes mellitus with diabetic neuropathy, unspecified: Secondary | ICD-10-CM

## 2022-04-27 DIAGNOSIS — E1122 Type 2 diabetes mellitus with diabetic chronic kidney disease: Secondary | ICD-10-CM

## 2022-04-27 DIAGNOSIS — G8929 Other chronic pain: Secondary | ICD-10-CM

## 2022-04-27 DIAGNOSIS — Z23 Encounter for immunization: Secondary | ICD-10-CM | POA: Diagnosis not present

## 2022-04-27 DIAGNOSIS — K2900 Acute gastritis without bleeding: Secondary | ICD-10-CM

## 2022-04-27 DIAGNOSIS — E1165 Type 2 diabetes mellitus with hyperglycemia: Secondary | ICD-10-CM

## 2022-04-27 LAB — POCT GLYCOSYLATED HEMOGLOBIN (HGB A1C): Hemoglobin A1C: 6.9 % — AB (ref 4.0–5.6)

## 2022-04-27 MED ORDER — ATORVASTATIN CALCIUM 10 MG PO TABS: 10.0000 mg | ORAL_TABLET | Freq: Every day | ORAL | 3 refills | Status: DC

## 2022-04-27 MED ORDER — PANTOPRAZOLE SODIUM 40 MG PO TBEC: 40.0000 mg | DELAYED_RELEASE_TABLET | Freq: Every day | ORAL | 3 refills | Status: DC

## 2022-04-27 MED ORDER — GABAPENTIN 100 MG PO CAPS: 200.0000 mg | ORAL_CAPSULE | Freq: Every day | ORAL | 1 refills | Status: DC

## 2022-04-27 MED ORDER — CYCLOBENZAPRINE HCL 5 MG PO TABS: 5.0000 mg | ORAL_TABLET | Freq: Every evening | ORAL | 0 refills | Status: DC | PRN

## 2022-04-27 MED ORDER — OZEMPIC (0.25 OR 0.5 MG/DOSE) 2 MG/3ML ~~LOC~~ SOPN: 0.5000 mg | PEN_INJECTOR | SUBCUTANEOUS | 5 refills | Status: DC

## 2022-04-27 NOTE — Progress Notes (Signed)
[facility] [place], [place] [address]  Internal MEDICINE  Office Visit Note  Patient Name: Gabriel Burton  [id number]  [id number]  Date of Service: 04/27/2022  Chief Complaint  Patient presents with   Follow-up   Hyperlipidemia   Hypertension   Diabetes    HPI Asencion presents for a follow-up visit for diabetes, hypertension, shoulder pain and constipation. Constipation with ozempic -- has not tried any OTC remedies.  Broke left arm years ago shoulder hurting esp in sleep --muscle relaxer Left nipple tender and painful, using otc cortisone to area Diabetes - A1c continues to improve, 6.9 today. Was 7.3 at last visit.  Doing ok since recent stroke.      Current Medication: Outpatient Encounter Medications as of 04/27/2022  Medication Sig   amLODipine (NORVASC) 10 MG tablet Take 1 tablet (10 mg total) by mouth daily.   aspirin EC 81 MG tablet Take 81 mg by mouth daily.    carvedilol (COREG) 12.5 MG tablet Take 1.5 tablets (18.75 mg total) by mouth 2 (two) times daily with a meal.   Cholecalciferol (VITAMIN D3) 5000 units TABS Take 5,000 Units by mouth daily.    CINNAMON PO Take by mouth daily.    clopidogrel (PLAVIX) 75 MG tablet Take 1 tablet (75 mg total) by mouth daily.   cyclobenzaprine (FLEXERIL) 5 MG tablet Take 1 tablet (5 mg total) by mouth at bedtime as needed for muscle spasms.   fluticasone (FLONASE) 50 MCG/ACT nasal spray Place 2 sprays into both nostrils daily.   lisinopril-hydrochlorothiazide (ZESTORETIC) 20-12.5 MG tablet TAKE 2 TABLETS BY MOUTH ONCE DAILY FOR HIGH BLOOD PRESSURE   nitroGLYCERIN (NITROSTAT) 0.4 MG SL tablet DISSOLVE ONE TABLET UNDER THE TONGUE EVERY 5 MINUTES AS NEEDED FOR CHEST PAIN.  DO NOT EXCEED A TOTAL OF 3 DOSES IN 15 MINUTES   Semaglutide,0.25 or 0.5MG /DOS, (OZEMPIC, 0.25 OR 0.5 MG/DOSE,) 2 MG/3ML SOPN Inject 0.5 mg into the skin once a week.   spironolactone (ALDACTONE) 25 MG tablet Take 1 tablet by mouth once  daily   tadalafil (CIALIS) 20 MG tablet Take 1 tablet (20 mg total) by mouth daily as needed for erectile dysfunction.   tamsulosin (FLOMAX) 0.4 MG CAPS capsule TAKE 1 CAPSULE BY MOUTH ONE-HALF HOUR AFTER THE SAME MEAL EACH DAY   TOUJEO SOLOSTAR 300 UNIT/ML Solostar Pen Inject 40 units [place] daily. May increase up to 45 units as indicated.   traMADol (ULTRAM) 50 MG tablet One tab po bid prn for pain   TRUEPLUS 5-BEVEL PEN NEEDLES 32G X 4 MM MISC USE  TWICE DAILY AS DIRECTED   vitamin C (ASCORBIC ACID) 500 MG tablet Take 500 mg by mouth daily.    [DISCONTINUED] atorvastatin (LIPITOR) 10 MG tablet Take 1 tablet (10 mg total) by mouth at bedtime.   [DISCONTINUED] gabapentin (NEURONTIN) 100 MG capsule Take 1 capsule (100 mg total) by mouth daily.   [DISCONTINUED] methocarbamol (ROBAXIN) 500 MG tablet Take 500 mg by mouth 3 (three) times daily as needed for muscle spasms.   [DISCONTINUED] pantoprazole (PROTONIX) 40 MG tablet Take 1 tablet (40 mg total) by mouth daily.   [DISCONTINUED] Semaglutide,0.25 or 0.5MG /DOS, 2 MG/1.5ML SOPN Inject 0.5 mg into the skin once a week.   atorvastatin (LIPITOR) 10 MG tablet Take 1 tablet (10 mg total) by mouth at bedtime.   gabapentin (NEURONTIN) 100 MG capsule Take 2 capsules (200 mg total) by mouth daily.   pantoprazole (PROTONIX) 40 MG tablet Take 1 tablet (40  mg total) by mouth daily.   No facility-administered encounter medications on file as of 04/27/2022.    Surgical History: History reviewed. No pertinent surgical history.  Medical History: Past Medical History:  Diagnosis Date   Diabetes mellitus without complication (HCC)    Diastolic dysfunction    a. 09/2018 Echo: EF 63%, diast dysfxn. Mild conc LVH. Mild TR.   History of stress test    a. 09/2018 ETT: Ex time 3:50. BP 207/90. Inconclusive 2/2 LBBB.  Stopped 2/2 claudication; b. 12/2018 Lexi MV: fixed septal/apical defect - felt to be artifact 2/2 LBBB. Very mild cor Ca2+ and Ao atherosclerosis.  Low-mod risk study. EF 43%.   Hyperlipidemia    Hypertension    a. 08/2020 Renal artery duplex: No evidence of RAS.   Left bundle branch block    Stroke [facility])     Family History: Family History  Problem Relation Age of Onset   Hypertension Mother    Hypertension Father    Heart attack Brother 90    Social History   Socioeconomic History   Marital status: Widowed    Spouse name: Not on file   Number of children: Not on file   Years of education: Not on file   Highest education level: Not on file  Occupational History   Not on file  Tobacco Use   Smoking status: Former    Packs/day: 0.40    Years: 8.00    Total pack years: 3.20    Types: Cigarettes    Quit date: 2000    Years since quitting: 23.7   Smokeless tobacco: Never  Vaping Use   Vaping Use: Never used  Substance and Sexual Activity   Alcohol use: Never   Drug use: Never   Sexual activity: Not on file  Other Topics Concern   Not on file  Social History Narrative   Not on file   Social Determinants of Health   Financial Resource Strain: Not on file  Food Insecurity: Not on file  Transportation Needs: Not on file  Physical Activity: Not on file  Stress: Not on file  Social Connections: Not on file  Intimate Partner Violence: Not on file      Review of Systems  Constitutional:  Negative for chills, fatigue and unexpected weight change.  HENT:  Negative for congestion, postnasal drip, rhinorrhea, sneezing and sore throat.   Eyes:  Positive for visual disturbance (loss of some of his peripheral vision). Negative for redness.  Respiratory:  Negative for cough, chest tightness and shortness of breath.   Cardiovascular:  Negative for chest pain and palpitations.  Gastrointestinal:  Negative for abdominal pain, constipation, diarrhea, nausea and vomiting.  Genitourinary:  Negative for dysuria and frequency.  Musculoskeletal:  Negative for arthralgias, back pain, joint swelling and neck pain.  Skin:   Negative for rash.  Neurological: Negative.  Negative for tremors and numbness.  Hematological:  Negative for adenopathy. Does not bruise/bleed easily.  Psychiatric/Behavioral:  Negative for behavioral problems (Depression), sleep disturbance and suicidal ideas. The patient is not nervous/anxious.     Vital Signs: BP 133/72   Pulse 76   Temp 98.2 F (36.8 C)   Resp 16   Ht 6\' 2"  (1.88 m)   Wt 211 lb (95.7 kg)   SpO2 99%   BMI 27.09 kg/m    Physical Exam Vitals reviewed.  Constitutional:      General: He is not in acute distress.    Appearance: Normal appearance. He is not  ill-appearing.  HENT:     Head: Normocephalic and atraumatic.  Eyes:     Pupils: Pupils are equal, round, and reactive to light.  Cardiovascular:     Rate and Rhythm: Normal rate and regular rhythm.     Heart sounds: No murmur heard. Pulmonary:     Effort: Pulmonary effort is normal. No respiratory distress.     Breath sounds: Normal breath sounds. No wheezing.  Neurological:     Mental Status: He is alert and oriented to person, place, and time.     Gait: Gait abnormal (has cane).  Psychiatric:        Mood and Affect: Mood normal.        Behavior: Behavior normal.        Assessment/Plan: 1. Type 2 diabetes mellitus with diabetic neuropathy, with long-term current use of insulin (HCC) A1c 6.9, continues to improve. Ozempic is causing constipation, discussed OTC medications and nonprescription intervention that can improve constipation.  - POCT glycosylated hemoglobin (Hb A1C) - Semaglutide,0.25 or 0.5MG /DOS, (OZEMPIC, 0.25 OR 0.5 MG/DOSE,) 2 MG/3ML SOPN; Inject 0.5 mg into the skin once a week.  Dispense: 3 mL; Refill: 5 - Urine Microalbumin w/creat. ratio  2. Chronic pain in left shoulder Cyclobenzaprine ordered, take prn as prescribed - cyclobenzaprine (FLEXERIL) 5 MG tablet; Take 1 tablet (5 mg total) by mouth at bedtime as needed for muscle spasms.  Dispense: 60 tablet; Refill: 0  3.  Medication refill - gabapentin (NEURONTIN) 100 MG capsule; Take 2 capsules (200 mg total) by mouth daily.  Dispense: 180 capsule; Refill: 1 - pantoprazole (PROTONIX) 40 MG tablet; Take 1 tablet (40 mg total) by mouth daily.  Dispense: 90 tablet; Refill: 3 - atorvastatin (LIPITOR) 10 MG tablet; Take 1 tablet (10 mg total) by mouth at bedtime.  Dispense: 90 tablet; Refill: 3  4. Needs flu shot Administered flu shot in office today - Flu Vaccine MDCK QUAD PF   General Counseling: Derion verbalizes understanding of the findings of todays visit and agrees with plan of treatment. I have discussed any further diagnostic evaluation that may be needed or ordered today. We also reviewed his medications today. he has been encouraged to call the office with any questions or concerns that should arise related to todays visit.    Orders Placed This Encounter  Procedures   Flu Vaccine MDCK QUAD PF   POCT glycosylated hemoglobin (Hb A1C)    Meds ordered this encounter  Medications   gabapentin (NEURONTIN) 100 MG capsule    Sig: Take 2 capsules (200 mg total) by mouth daily.    Dispense:  180 capsule    Refill:  1    Patient will call for refills when needed. Please fill as 90 day prescription.   cyclobenzaprine (FLEXERIL) 5 MG tablet    Sig: Take 1 tablet (5 mg total) by mouth at bedtime as needed for muscle spasms.    Dispense:  60 tablet    Refill:  0    Fill new script asap.   Semaglutide,0.25 or 0.5MG /DOS, (OZEMPIC, 0.25 OR 0.5 MG/DOSE,) 2 MG/3ML SOPN    Sig: Inject 0.5 mg into the skin once a week.    Dispense:  3 mL    Refill:  5    Note change in pen from the discontinued 1.5 ml pen to the newer 67ml pen   pantoprazole (PROTONIX) 40 MG tablet    Sig: Take 1 tablet (40 mg total) by mouth daily.    Dispense:  90  tablet    Refill:  3    patient will call for refills.   atorvastatin (LIPITOR) 10 MG tablet    Sig: Take 1 tablet (10 mg total) by mouth at bedtime.    Dispense:  90 tablet     Refill:  3    For future refills    Return in 11 weeks (on 07/13/2022) for previously scheduled, CPE, Airica Schwartzkopf PCP.   Total time spent:30 Minutes Time spent includes review of chart, medications, test results, and follow up plan with the patient.   [place] Controlled Substance Database was reviewed by me.  This patient was seen by Sallyanne Kuster, FNP-C in collaboration with Dr. Beverely Risen as a part of collaborative care agreement.   [place] Gustafson R. Tedd Sias, MSN, FNP-C Internal medicine

## 2022-04-28 ENCOUNTER — Ambulatory Visit: Payer: BC Managed Care – PPO | Admitting: Internal Medicine

## 2022-04-28 LAB — MICROALBUMIN / CREATININE URINE RATIO
Creatinine, Urine: 49.5 mg/dL
Microalb/Creat Ratio: <6 mg/g creat (ref 0–29)
Microalbumin, Urine: <3 ug/mL

## 2022-05-02 ENCOUNTER — Other Ambulatory Visit: Payer: Self-pay | Admitting: Nurse Practitioner

## 2022-05-02 DIAGNOSIS — Z794 Long term (current) use of insulin: Secondary | ICD-10-CM

## 2022-05-15 ENCOUNTER — Other Ambulatory Visit: Payer: Self-pay | Admitting: Internal Medicine

## 2022-05-17 ENCOUNTER — Ambulatory Visit: Payer: No Typology Code available for payment source | Admitting: Cardiology

## 2022-05-29 ENCOUNTER — Other Ambulatory Visit: Payer: Self-pay | Admitting: Internal Medicine

## 2022-06-13 NOTE — Progress Notes (Unsigned)
Electrophysiology Office Note:    Date:  06/14/2022   ID:  Gabriel Burton, DOB 09/03/1950, MRN [id number]  PCP:  Sallyanne Kuster, NP  [facility] Cardiologist:  Yvonne Kendall, MD  [facility] Electrophysiologist:  Lanier Prude, MD   Referring MD: Yvonne Kendall, MD   Chief Complaint: cryptogenic stroke  History of Present Illness:    Gabriel Burton is a 71 y.o. male who presents for an evaluation of cryptogenic stroke at the request of Dr End. Their medical history includes HTN, HLD, DM, LBBB. The patient last saw Dr Okey Dupre 04/06/2022. 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 He still has some visual disturbances after the stroke. Workup after the stroke including a 14 day monitor was negative for cause. He presents today for ILR as part of the ongoing workup for the cause of his stroke. Today he confirms that he continues to struggle with visual field defects on the left side after his stroke.  No atrial fibrillation history that he is aware of.    Past Medical History:  Diagnosis Date   Diabetes mellitus without complication (HCC)    Diastolic dysfunction    a. 09/2018 Echo: EF 63%, diast dysfxn. Mild conc LVH. Mild TR.   History of stress test    a. 09/2018 ETT: Ex time 3:50. BP 207/90. Inconclusive 2/2 LBBB.  Stopped 2/2 claudication; b. 12/2018 Lexi MV: fixed septal/apical defect - felt to be artifact 2/2 LBBB. Very mild cor Ca2+ and Ao atherosclerosis. Low-mod risk study. EF 43%.   Hyperlipidemia    Hypertension    a. 08/2020 Renal artery duplex: No evidence of RAS.   Left bundle branch block    Stroke [facility])     History reviewed. No pertinent surgical history.  Current Medications: Current Meds  Medication Sig   amLODipine (NORVASC) 10 MG tablet Take 1 tablet (10 mg total) by mouth daily.   aspirin EC 81 MG tablet Take 81 mg by mouth daily.    atorvastatin (LIPITOR) 10 MG tablet Take 1 tablet (10 mg  total) by mouth at bedtime.   carvedilol (COREG) 12.5 MG tablet Take 1.5 tablets (18.75 mg total) by mouth 2 (two) times daily with a meal.   Cholecalciferol (VITAMIN D3) 5000 units TABS Take 5,000 Units by mouth daily.    CINNAMON PO Take by mouth daily.    clopidogrel (PLAVIX) 75 MG tablet Take 1 tablet (75 mg total) by mouth daily.   cyclobenzaprine (FLEXERIL) 5 MG tablet Take 1 tablet (5 mg total) by mouth at bedtime as needed for muscle spasms.   fluticasone (FLONASE) 50 MCG/ACT nasal spray Place 2 sprays into both nostrils daily.   gabapentin (NEURONTIN) 100 MG capsule Take 2 capsules (200 mg total) by mouth daily.   lisinopril-hydrochlorothiazide (ZESTORETIC) 20-12.5 MG tablet TAKE 2 TABLETS BY MOUTH ONCE DAILY FOR HIGH BLOOD PRESSURE   nitroGLYCERIN (NITROSTAT) 0.4 MG SL tablet DISSOLVE ONE TABLET UNDER THE TONGUE EVERY 5 MINUTES AS NEEDED FOR CHEST PAIN.  DO NOT EXCEED A TOTAL OF 3 DOSES IN 15 MINUTES   pantoprazole (PROTONIX) 40 MG tablet Take 1 tablet (40 mg total) by mouth daily.   Semaglutide,0.25 or 0.5MG /DOS, (OZEMPIC, 0.25 OR 0.5 MG/DOSE,) 2 MG/3ML SOPN Inject 0.5 mg into the skin once a week.   spironolactone (ALDACTONE) 25 MG tablet Take 1 tablet by mouth once daily   tadalafil (CIALIS) 20 MG tablet Take 1 tablet (20 mg total) by mouth  daily as needed for erectile dysfunction.   tamsulosin (FLOMAX) 0.4 MG CAPS capsule TAKE 1 CAPSULE BY MOUTH ONE-HALF HOUR AFTER THE SAME MEAL EACH DAY   TOUJEO SOLOSTAR 300 UNIT/ML Solostar Pen INJECT 40 UNITS SUBCUTANEOUSLY DAILY. MAY INCREASE UP TO 45 UNITS AS INDICATED.   traMADol (ULTRAM) 50 MG tablet One tab po bid prn for pain   TRUEPLUS 5-BEVEL PEN NEEDLES 32G X 4 MM MISC USE  TWICE DAILY AS DIRECTED   vitamin C (ASCORBIC ACID) 500 MG tablet Take 500 mg by mouth daily.      Allergies:   Patient has no known allergies.   Social History   Socioeconomic History   Marital status: Widowed    Spouse name: Not on file   Number of children:  Not on file   Years of education: Not on file   Highest education level: Not on file  Occupational History   Not on file  Tobacco Use   Smoking status: Former    Packs/day: 0.40    Years: 8.00    Total pack years: 3.20    Types: Cigarettes    Quit date: 2000    Years since quitting: 23.9   Smokeless tobacco: Never  Vaping Use   Vaping Use: Never used  Substance and Sexual Activity   Alcohol use: Never   Drug use: Never   Sexual activity: Not on file  Other Topics Concern   Not on file  Social History Narrative   Not on file   Social Determinants of Health   Financial Resource Strain: Not on file  Food Insecurity: Not on file  Transportation Needs: Not on file  Physical Activity: Not on file  Stress: Not on file  Social Connections: Not on file     Family History: The patient's family history includes Heart attack (age of onset: 75) in his brother; Hypertension in his father and mother.  ROS:   Please see the history of present illness.    All other systems reviewed and are negative.  EKGs/Labs/Other Studies Reviewed:    The following studies were reviewed today:  02/07/2022 Zio No AF detected  04/06/2022 ECG shows LBBB, sinus, first degree AV delay  01/05/2022 echo - EF 50  All available ECG reviewed and show no evidence of AF/AFL.   Recent Labs: 01/04/2022: ALT 31; BUN 23; Creatinine, Ser 1.65; Hemoglobin 12.8; Platelets 201; Potassium 3.9; Sodium 140  Recent Lipid Panel    Component Value Date/Time   CHOL 128 01/05/2022 0525   CHOL 150 07/25/2021 1509   TRIG 63 01/05/2022 0525   HDL 51 01/05/2022 0525   HDL 57 07/25/2021 1509   CHOLHDL 2.5 01/05/2022 0525   VLDL 13 01/05/2022 0525   LDLCALC 64 01/05/2022 0525   LDLCALC 75 07/25/2021 1509    Physical Exam:    VS:  BP (!) 142/68   Pulse 84   Ht 6\' 2"  (1.88 m)   Wt 212 lb (96.2 kg)   SpO2 98%   BMI 27.22 kg/m     Wt Readings from Last 3 Encounters:  06/14/22 212 lb (96.2 kg)  04/27/22 211  lb (95.7 kg)  04/06/22 211 lb 9.6 oz (96 kg)     GEN:  Well nourished, well developed in no acute distress HEENT: Normal NECK: No JVD; No carotid bruits LYMPHATICS: No lymphadenopathy CARDIAC: RRR, no murmurs, rubs, gallops RESPIRATORY:  Clear to auscultation without rales, wheezing or rhonchi  ABDOMEN: Soft, non-tender, non-distended MUSCULOSKELETAL:  No edema; No  deformity  SKIN: Warm and dry NEUROLOGIC:  Alert and oriented x 3 PSYCHIATRIC:  Normal affect       ASSESSMENT:    1. Cryptogenic stroke (HCC)   2. Primary hypertension    PLAN:    In order of problems listed above:  #Cryptogenic stroke No clear cause for his stroke earlier this year.  #Cryptogenic Stroke Pathophysiology of cryptogenic stroke was discussed in detail during today's clinic appointment. I discussed the role of loop recorder monitoring in patients who have suffered a CVA/TIA. There has been no evidence of AF thus far in the patient's evaluation. Loop recorder monitors were discussed in detail including the implant procedure and its risks. I discussed the monthly monitoring costs associated with loop recorder monitoring. The patient would like to proceed with ILR implant.     Medication Adjustments/Labs and Tests Ordered: Current medicines are reviewed at length with the patient today.  Concerns regarding medicines are outlined above.  No orders of the defined types were placed in this encounter.  No orders of the defined types were placed in this encounter.    Signed, Rossie Muskrat. Lalla Brothers, MD, [facility], [facility] 06/14/2022 8:58 AM    Electrophysiology [facility]  ------------------------------------  SURGEON:  Lanier Prude, MD     PREPROCEDURE DIAGNOSIS:  Cryptogenic stroke    POSTPROCEDURE DIAGNOSIS: Cryptogenic stroke     PROCEDURES:   1. Implantable loop recorder implantation    INTRODUCTION:  DAYVEN LINSLEY presents with a history of cryptogenic stroke The  costs of loop recorder monitoring have been discussed with the patient.    DESCRIPTION OF PROCEDURE:  Informed written consent was obtained.  The patient required no sedation for the procedure today.  Mapping over the patient's chest was performed to identify the area where electrograms were most prominent for ILR recording.  This area was found to be the left parasternal region over the 4th intercostal space. The patients left chest was therefore prepped and draped in the usual sterile fashion. The skin overlying the left parasternal region was infiltrated with lidocaine for local analgesia.  A 0.5-cm incision was made over the left parasternal region over the 3rd intercostal space.  A subcutaneous ILR pocket was fashioned using a combination of sharp and blunt dissection.  A Medtronic Reveal LINQ implantable loop recorder was then placed into the pocket  R waves were very prominent and measured >0.52mV.  Steri- Strips and a sterile dressing were then applied.  There were no early apparent complications.     CONCLUSIONS:   1. Successful implantation of a implantable loop recorder for Cryptogenic stroke  2. No early apparent complications.   Sheria Lang T. Lalla Brothers, MD, [facility], [facility] Cardiac Electrophysiology

## 2022-06-14 ENCOUNTER — Ambulatory Visit: Payer: BC Managed Care – PPO | Attending: Cardiology | Admitting: Cardiology

## 2022-06-14 ENCOUNTER — Encounter: Payer: Self-pay | Admitting: Cardiology

## 2022-06-14 VITALS — BP 142/68 | HR 84 | Ht 74.0 in | Wt 212.0 lb

## 2022-06-14 DIAGNOSIS — I639 Cerebral infarction, unspecified: Secondary | ICD-10-CM

## 2022-06-14 DIAGNOSIS — I10 Essential (primary) hypertension: Secondary | ICD-10-CM

## 2022-06-14 HISTORY — PX: LOOP RECORDER IMPLANT: SHX5954

## 2022-06-14 NOTE — Patient Instructions (Signed)
Medication Instructions:  Your physician recommends that you continue on your current medications as directed. Please refer to the Current Medication list given to you today.  Labwork: None ordered.  Testing/Procedures: None ordered.  Follow-Up:  Your physician wants you to follow-up in: one year with Dr. Lambert.  You will receive a reminder letter in the mail two months in advance. If you don't receive a letter, please call our office to schedule the follow-up appointment.   Implantable Loop Recorder Placement, Care After This sheet gives you information about how to care for yourself after your procedure. Your health care provider may also give you more specific instructions. If you have problems or questions, contact your health care provider. What can I expect after the procedure? After the procedure, it is common to have: Soreness or discomfort near the incision. Some swelling or bruising near the incision.  Follow these instructions at home: Incision care  Monitor your cardiac device site for redness, swelling, and drainage. Call the device clinic at [phone] if you experience these symptoms or fever/chills.  Keep the large square bandage on your site for 24 hours and then you may remove it yourself. Keep the steri-strips underneath in place.   You may shower after 72 hours / 3 days from your procedure with the steri-strips in place. They will usually fall off on their own, or may be removed after 10 days. Pat dry.   Avoid lotions, ointments, or perfumes over your incision until it is well-healed.  Please do not submerge in water until your site is completely healed.   Your device is MRI compatible.   Remote monitoring is used to monitor your cardiac device from home. This monitoring is scheduled every month by our office. It allows us to keep an eye on the function of your device to ensure it is working properly.  If your wound site starts to bleed apply pressure.     For help with the monitor please call Medtronic Monitor Support Specialist directly at [phone].    If you have any questions/concerns please call the device clinic at [phone].  Activity  Return to your normal activities.  General instructions Follow instructions from your health care provider about how to manage your implantable loop recorder and transmit the information. Learn how to activate a recording if this is necessary for your type of device. You may go through a metal detection gate, and you may let someone hold a metal detector over your chest. Show your ID card if needed. Do not have an MRI unless you check with your health care provider first. Take over-the-counter and prescription medicines only as told by your health care provider. Keep all follow-up visits as told by your health care provider. This is important. Contact a health care provider if: You have redness, swelling, or pain around your incision. You have a fever. You have pain that is not relieved by your pain medicine. You have triggered your device because of fainting (syncope) or because of a heartbeat that feels like it is racing, slow, fluttering, or skipping (palpitations). Get help right away if you have: Chest pain. Difficulty breathing. Summary After the procedure, it is common to have soreness or discomfort near the incision. Change your dressing as told by your health care provider. Follow instructions from your health care provider about how to manage your implantable loop recorder and transmit the information. Keep all follow-up visits as told by your health care provider. This is important. This information is   not intended to replace advice given to you by your health care provider. Make sure you discuss any questions you have with your health care provider. Document Released: 06/14/2015 Document Revised: 08/18/2017 Document Reviewed: 08/18/2017 Elsevier Patient Education  2020 [employer]  Inc[employer]  

## 2022-06-28 ENCOUNTER — Other Ambulatory Visit: Payer: Self-pay | Admitting: Nurse Practitioner

## 2022-06-28 DIAGNOSIS — I10 Essential (primary) hypertension: Secondary | ICD-10-CM

## 2022-06-30 ENCOUNTER — Ambulatory Visit: Payer: BC Managed Care – PPO | Admitting: Nurse Practitioner

## 2022-07-13 ENCOUNTER — Telehealth: Payer: Self-pay | Admitting: Nurse Practitioner

## 2022-07-13 ENCOUNTER — Ambulatory Visit (INDEPENDENT_AMBULATORY_CARE_PROVIDER_SITE_OTHER): Payer: BC Managed Care – PPO | Admitting: Internal Medicine

## 2022-07-13 ENCOUNTER — Other Ambulatory Visit: Payer: Self-pay | Admitting: Nurse Practitioner

## 2022-07-13 ENCOUNTER — Encounter: Payer: Self-pay | Admitting: Internal Medicine

## 2022-07-13 DIAGNOSIS — G8929 Other chronic pain: Secondary | ICD-10-CM

## 2022-07-13 DIAGNOSIS — Z1211 Encounter for screening for malignant neoplasm of colon: Secondary | ICD-10-CM

## 2022-07-13 DIAGNOSIS — M542 Cervicalgia: Secondary | ICD-10-CM | POA: Diagnosis not present

## 2022-07-13 DIAGNOSIS — Z794 Long term (current) use of insulin: Secondary | ICD-10-CM

## 2022-07-13 DIAGNOSIS — Z8673 Personal history of transient ischemic attack (TIA), and cerebral infarction without residual deficits: Secondary | ICD-10-CM

## 2022-07-13 DIAGNOSIS — R300 Dysuria: Secondary | ICD-10-CM

## 2022-07-13 DIAGNOSIS — K5909 Other constipation: Secondary | ICD-10-CM

## 2022-07-13 DIAGNOSIS — I639 Cerebral infarction, unspecified: Secondary | ICD-10-CM

## 2022-07-13 DIAGNOSIS — E1140 Type 2 diabetes mellitus with diabetic neuropathy, unspecified: Secondary | ICD-10-CM

## 2022-07-13 DIAGNOSIS — Z0001 Encounter for general adult medical examination with abnormal findings: Secondary | ICD-10-CM

## 2022-07-13 DIAGNOSIS — H53452 Other localized visual field defect, left eye: Secondary | ICD-10-CM

## 2022-07-13 MED ORDER — LINACLOTIDE 72 MCG PO CAPS: 72.0000 ug | ORAL_CAPSULE | Freq: Every day | ORAL | 3 refills | Status: DC

## 2022-07-13 MED ORDER — GABAPENTIN 300 MG PO CAPS: ORAL_CAPSULE | ORAL | 0 refills | Status: DC

## 2022-07-13 MED ORDER — CLOPIDOGREL BISULFATE 75 MG PO TABS: 75.0000 mg | ORAL_TABLET | Freq: Every day | ORAL | 1 refills | Status: DC

## 2022-07-13 NOTE — Telephone Encounter (Signed)
done

## 2022-07-13 NOTE — Progress Notes (Signed)
[facility] [address] [place], [place] [address]  Internal MEDICINE  Office Visit Note  Patient Name: Gabriel SorrowJohnny L Burton  [id number]  [id number]  Date of Service: 07/25/2022  Chief Complaint  Patient presents with   Medicare Wellness   Diabetes   Hypertension   Hyperlipidemia   Quality Metric Gaps    Pneumonia, TDAP and Colonoscopy     HPI Pt is here for routine health maintenance examination C/O neck pain and with radiation in both arms. Wakes him up due to pain, now he is having leg weakness and numbness in both arms, patient was prescribed muscle relaxer with no relief in his symptoms. Recent TIA/cryptogenic stroke patient is maintained on Plavix and Lipitor, continues to have problem with visual field Diabetes is well controlled  Complains of constipation He had tried Linzess samples and they worked well for him will like to have prescription Current Medication: Outpatient Encounter Medications as of 07/13/2022  Medication Sig   amLODipine (NORVASC) 10 MG tablet Take 1 tablet (10 mg total) by mouth daily.   aspirin EC 81 MG tablet Take 81 mg by mouth daily.    atorvastatin (LIPITOR) 10 MG tablet Take 1 tablet (10 mg total) by mouth at bedtime.   carvedilol (COREG) 12.5 MG tablet TAKE 1 & 1/2 (ONE & ONE-HALF) TABLETS BY MOUTH TWICE DAILY WITH A MEAL   Cholecalciferol (VITAMIN D3) 5000 units TABS Take 5,000 Units by mouth daily.    CINNAMON PO Take by mouth daily.    cyclobenzaprine (FLEXERIL) 5 MG tablet Take 1 tablet (5 mg total) by mouth at bedtime as needed for muscle spasms.   fluticasone (FLONASE) 50 MCG/ACT nasal spray Place 2 sprays into both nostrils daily.   gabapentin (NEURONTIN) 300 MG capsule Take one tab po bid for neck pain   linaclotide (LINZESS) 72 MCG capsule Take 1 capsule (72 mcg total) by mouth daily before breakfast.   lisinopril-hydrochlorothiazide (ZESTORETIC) 20-12.5 MG tablet TAKE 2 TABLETS BY MOUTH ONCE DAILY FOR HIGH BLOOD PRESSURE    nitroGLYCERIN (NITROSTAT) 0.4 MG SL tablet DISSOLVE ONE TABLET UNDER THE TONGUE EVERY 5 MINUTES AS NEEDED FOR CHEST PAIN.  DO NOT EXCEED A TOTAL OF 3 DOSES IN 15 MINUTES   pantoprazole (PROTONIX) 40 MG tablet Take 1 tablet (40 mg total) by mouth daily.   Semaglutide,0.25 or 0.5MG /DOS, (OZEMPIC, 0.25 OR 0.5 MG/DOSE,) 2 MG/3ML SOPN Inject 0.5 mg into the skin once a week.   spironolactone (ALDACTONE) 25 MG tablet Take 1 tablet by mouth once daily   tadalafil (CIALIS) 20 MG tablet Take 1 tablet (20 mg total) by mouth daily as needed for erectile dysfunction.   tamsulosin (FLOMAX) 0.4 MG CAPS capsule TAKE 1 CAPSULE BY MOUTH ONE-HALF HOUR AFTER THE SAME MEAL EACH DAY   traMADol (ULTRAM) 50 MG tablet One tab po bid prn for pain   TRUEPLUS 5-BEVEL PEN NEEDLES 32G X 4 MM MISC USE  TWICE DAILY AS DIRECTED   vitamin C (ASCORBIC ACID) 500 MG tablet Take 500 mg by mouth daily.    [DISCONTINUED] clopidogrel (PLAVIX) 75 MG tablet Take 1 tablet (75 mg total) by mouth daily.   [DISCONTINUED] gabapentin (NEURONTIN) 100 MG capsule Take 2 capsules (200 mg total) by mouth daily.   [DISCONTINUED] TOUJEO SOLOSTAR 300 UNIT/ML Solostar Pen INJECT 40 UNITS SUBCUTANEOUSLY DAILY. MAY INCREASE UP TO 45 UNITS AS INDICATED.   clopidogrel (PLAVIX) 75 MG tablet Take 1 tablet (75 mg total) by mouth daily.   No facility-administered encounter  medications on file as of 07/13/2022.    Surgical History: History reviewed. No pertinent surgical history.  Medical History: Past Medical History:  Diagnosis Date   Diabetes mellitus without complication (HCC)    Diastolic dysfunction    a. 09/2018 Echo: EF 63%, diast dysfxn. Mild conc LVH. Mild TR.   History of stress test    a. 09/2018 ETT: Ex time 3:50. BP 207/90. Inconclusive 2/2 LBBB.  Stopped 2/2 claudication; b. 12/2018 Lexi MV: fixed septal/apical defect - felt to be artifact 2/2 LBBB. Very mild cor Ca2+ and Ao atherosclerosis. Low-mod risk study. EF 43%.   Hyperlipidemia     Hypertension    a. 08/2020 Renal artery duplex: No evidence of RAS.   Left bundle branch block    Stroke [facility])     Family History: Family History  Problem Relation Age of Onset   Hypertension Mother    Hypertension Father    Heart attack Brother 53    Social History: Social History   Socioeconomic History   Marital status: Widowed    Spouse name: Not on file   Number of children: Not on file   Years of education: Not on file   Highest education level: Not on file  Occupational History   Not on file  Tobacco Use   Smoking status: Former    Packs/day: 0.40    Years: 8.00    Total pack years: 3.20    Types: Cigarettes    Quit date: 2000    Years since quitting: 24.0   Smokeless tobacco: Never  Vaping Use   Vaping Use: Never used  Substance and Sexual Activity   Alcohol use: Never   Drug use: Never   Sexual activity: Not on file  Other Topics Concern   Not on file  Social History Narrative   Not on file   Social Determinants of Health   Financial Resource Strain: Not on file  Food Insecurity: Not on file  Transportation Needs: Not on file  Physical Activity: Not on file  Stress: Not on file  Social Connections: Not on file      Review of Systems  Constitutional:  Negative for chills, fatigue and unexpected weight change.  HENT:  Negative for congestion, postnasal drip, rhinorrhea, sneezing and sore throat.   Eyes:  Negative for redness.  Respiratory:  Negative for cough, chest tightness and shortness of breath.   Cardiovascular:  Negative for chest pain and palpitations.  Gastrointestinal:  Negative for abdominal pain, constipation, diarrhea, nausea and vomiting.  Genitourinary:  Negative for dysuria and frequency.  Musculoskeletal:  Positive for neck pain. Negative for arthralgias, back pain and joint swelling.  Skin:  Negative for rash.  Neurological:  Positive for weakness and numbness. Negative for tremors.       Bilateral lower extremity weakness  and numbness  Hematological:  Negative for adenopathy. Does not bruise/bleed easily.  Psychiatric/Behavioral:  Positive for dysphoric mood. Negative for behavioral problems (Depression), sleep disturbance and suicidal ideas. The patient is not nervous/anxious.      Vital Signs: BP 128/71   Pulse 75   Temp 97.6 F (36.4 C)   Resp 16   Ht  (1.88 m)   Wt 211 lb 6.4 oz (95.9 kg)   SpO2 97%   BMI 27.14 kg/m    Physical Exam Constitutional:      General: He is not in acute distress.    Appearance: He is well-developed. He is not diaphoretic.  HENT:  Head: Normocephalic and atraumatic.     Right Ear: External ear normal.     Left Ear: External ear normal.     Nose: Nose normal.     Mouth/Throat:     Pharynx: No oropharyngeal exudate.  Eyes:     General: No scleral icterus.       Right eye: No discharge.        Left eye: No discharge.     Conjunctiva/sclera: Conjunctivae normal.     Pupils: Pupils are equal, round, and reactive to light.  Neck:     Thyroid: No thyromegaly.     Vascular: No JVD.     Trachea: No tracheal deviation.  Cardiovascular:     Rate and Rhythm: Normal rate and regular rhythm.     Heart sounds: Normal heart sounds. No murmur heard.    No friction rub. No gallop.  Pulmonary:     Effort: Pulmonary effort is normal. No respiratory distress.     Breath sounds: Normal breath sounds. No stridor. No wheezing or rales.  Chest:     Chest wall: No tenderness.  Abdominal:     General: Bowel sounds are normal. There is no distension.     Palpations: Abdomen is soft. There is no mass.     Tenderness: There is no abdominal tenderness. There is no guarding or rebound.  Musculoskeletal:        General: No tenderness or deformity. Normal range of motion.     Cervical back: Normal range of motion and neck supple.  Lymphadenopathy:     Cervical: No cervical adenopathy.  Skin:    General: Skin is warm and dry.     Coloration: Skin is not pale.      Findings: No erythema or rash.  Neurological:     Mental Status: He is alert.     Cranial Nerves: No cranial nerve deficit.     Motor: No abnormal muscle tone.     Coordination: Coordination normal.     Deep Tendon Reflexes: Reflexes are normal and symmetric.  Psychiatric:        Behavior: Behavior normal.        Thought Content: Thought content normal.        Judgment: Judgment normal.      LABS: Recent Results (from the past 2160 hour(s))  Urine Microalbumin w/creat. ratio     Status: None   Collection Time: 04/27/22  1:40 PM  Result Value Ref Range   Creatinine, Urine 49.5 Not Estab. mg/dL   Microalbumin, Urine <7.2 Not Estab. ug/mL   Microalb/Creat Ratio <6 0 - 29 mg/g creat    Comment:                        Normal:                0 -  29                        Moderately increased: 30 - 300                        Severely increased:       >300   POCT glycosylated hemoglobin (Hb A1C)     Status: Abnormal   Collection Time: 04/27/22  1:44 PM  Result Value Ref Range   Hemoglobin A1C 6.9 (A) 4.0 - 5.6 %   HbA1c POC (<> result, manual  entry)     HbA1c, POC (prediabetic range)     HbA1c, POC (controlled diabetic range)    UA/M w/rflx Culture, Routine     Status: Abnormal   Collection Time: 07/13/22  3:45 PM   Specimen: Urine   Urine  Result Value Ref Range   Specific Gravity, UA 1.007 1.005 - 1.030   pH, UA 6.5 5.0 - 7.5   Color, UA Yellow Yellow   Appearance Ur Clear Clear   Leukocytes,UA 1+ (A) Negative   Protein,UA Negative Negative/Trace   Glucose, UA Negative Negative   Ketones, UA Negative Negative   RBC, UA Negative Negative   Bilirubin, UA Negative Negative   Urobilinogen, Ur 0.2 0.2 - 1.0 mg/dL   Nitrite, UA Negative Negative   Microscopic Examination See below:     Comment: Microscopic was indicated and was performed.   Urinalysis Reflex Comment     Comment: This specimen has reflexed to a Urine Culture.  Microscopic Examination     Status: None    Collection Time: 07/13/22  3:45 PM   Urine  Result Value Ref Range   WBC, UA 0-5 0 - 5 /hpf   RBC, Urine None seen 0 - 2 /hpf   Epithelial Cells (non renal) None seen 0 - 10 /hpf   Casts None seen None seen /lpf   Bacteria, UA None seen None seen/Few  Urine Culture, Reflex     Status: None   Collection Time: 07/13/22  3:45 PM   Urine  Result Value Ref Range   Urine Culture, Routine Final report    Organism ID, Bacteria No growth   CUP PACEART REMOTE DEVICE CHECK     Status: None   Collection Time: 07/17/22  5:38 PM  Result Value Ref Range   Date Time Interrogation Session [id number]    Pulse Generator Manufacturer MERM    Pulse Gen Model LNQ22 LINQ II    Pulse Gen Serial Number [id number]    Clinic Name [facility]    Implantable Pulse Generator Type ICM/ILR    Implantable Pulse Generator Implant Date [id number]        Assessment/Plan: 1. Encounter for general adult medical examination with abnormal findings Patient is up-to-date on all preventive health maintenance we will continue to monitor along  2. Chronic neck pain with abnormal neurologic examination Worsening neck pain and numbness patient needs further diagnostic - MR Cervical Spine Wo Contrast; Future - DG Cervical Spine Complete; Future  3. Type 2 diabetes mellitus with diabetic neuropathy, with long-term current use of insulin (HCC) Will continue on 40 units of Toujeo and Ozempic 0.5 mg once a week  4. Other constipation Clinical prescription for Linzess 72 mcg once a day  5. Colon cancer screening Patient is up-to-date on his colonoscopy - Ambulatory referral to Gastroenterology  6. Cryptogenic stroke [facility]) Patient is followed by neurology and cardiology report will continue on Plavix 75 mg once a day along with Lipitor 10 mg once a day - clopidogrel (PLAVIX) 75 MG tablet; Take 1 tablet (75 mg total) by mouth daily.  Dispense: 90 tablet; Refill: 1  7. Decreased peripheral vision of left eye This  is sequela of cryptogenic stroke continue to follow along  8. Dysuria - UA/M w/rflx Culture, Routine   General Counseling: Cristhian verbalizes understanding of the findings of todays visit and agrees with plan of treatment. I have discussed any further diagnostic evaluation that may be needed or ordered today. We also reviewed his medications today. he  has been encouraged to call the office with any questions or concerns that should arise related to todays visit.    Counseling:  [place] Controlled Substance Database was reviewed by me.  Orders Placed This Encounter  Procedures   Microscopic Examination   Urine Culture, Reflex   MR Cervical Spine Wo Contrast   DG Cervical Spine Complete   UA/M w/rflx Culture, Routine   Ambulatory referral to Gastroenterology    Meds ordered this encounter  Medications   gabapentin (NEURONTIN) 300 MG capsule    Sig: Take one tab po bid for neck pain    Dispense:  180 capsule    Refill:  0   linaclotide (LINZESS) 72 MCG capsule    Sig: Take 1 capsule (72 mcg total) by mouth daily before breakfast.    Dispense:  30 capsule    Refill:  3   clopidogrel (PLAVIX) 75 MG tablet    Sig: Take 1 tablet (75 mg total) by mouth daily.    Dispense:  90 tablet    Refill:  1    Total time spent:35 Minutes  Time spent includes review of chart, medications, test results, and follow up plan with the patient.     Lyndon Code, MD  Internal Medicine

## 2022-07-14 ENCOUNTER — Telehealth: Payer: Self-pay

## 2022-07-14 NOTE — Telephone Encounter (Signed)
Sent P.A. for patient's Linzess.

## 2022-07-15 LAB — URINE CULTURE, REFLEX: Organism ID, Bacteria: NO GROWTH

## 2022-07-15 LAB — MICROSCOPIC EXAMINATION
Bacteria, UA: NONE SEEN
Casts: NONE SEEN /lpf
Epithelial Cells (non renal): NONE SEEN /hpf (ref 0–10)
RBC, Urine: NONE SEEN /hpf (ref 0–2)

## 2022-07-15 LAB — UA/M W/RFLX CULTURE, ROUTINE
Bilirubin, UA: NEGATIVE
Glucose, UA: NEGATIVE
Ketones, UA: NEGATIVE
Nitrite, UA: NEGATIVE
Protein,UA: NEGATIVE
RBC, UA: NEGATIVE
Specific Gravity, UA: 1.007 (ref 1.005–1.030)
Urobilinogen, Ur: 0.2 mg/dL (ref 0.2–1.0)
pH, UA: 6.5 (ref 5.0–7.5)

## 2022-07-18 ENCOUNTER — Telehealth: Payer: Self-pay | Admitting: Nurse Practitioner

## 2022-07-18 ENCOUNTER — Ambulatory Visit (INDEPENDENT_AMBULATORY_CARE_PROVIDER_SITE_OTHER): Payer: BC Managed Care – PPO

## 2022-07-18 DIAGNOSIS — I639 Cerebral infarction, unspecified: Secondary | ICD-10-CM | POA: Diagnosis not present

## 2022-07-18 LAB — CUP PACEART REMOTE DEVICE CHECK
Date Time Interrogation Session: 20240101173816
Implantable Pulse Generator Implant Date: 20231129

## 2022-07-19 ENCOUNTER — Other Ambulatory Visit: Payer: Self-pay | Admitting: Nurse Practitioner

## 2022-07-19 DIAGNOSIS — E1140 Type 2 diabetes mellitus with diabetic neuropathy, unspecified: Secondary | ICD-10-CM

## 2022-07-19 NOTE — Telephone Encounter (Signed)
Error

## 2022-07-24 ENCOUNTER — Ambulatory Visit: Admission: RE | Admit: 2022-07-24 | Payer: BC Managed Care – PPO | Source: Ambulatory Visit | Admitting: *Deleted

## 2022-07-24 ENCOUNTER — Ambulatory Visit
Admission: RE | Admit: 2022-07-24 | Discharge: 2022-07-24 | Disposition: A | Discharge status: Home / Self Care | Payer: BC Managed Care – PPO | Source: Ambulatory Visit | Attending: Internal Medicine | Admitting: Internal Medicine

## 2022-07-24 ENCOUNTER — Ambulatory Visit
Admission: RE | Admit: 2022-07-24 | Discharge: 2022-07-24 | Disposition: A | Discharge status: Home / Self Care | Payer: BC Managed Care – PPO | Attending: Internal Medicine | Admitting: Internal Medicine

## 2022-07-24 DIAGNOSIS — K5909 Other constipation: Secondary | ICD-10-CM | POA: Diagnosis present

## 2022-07-24 DIAGNOSIS — M47812 Spondylosis without myelopathy or radiculopathy, cervical region: Secondary | ICD-10-CM | POA: Diagnosis not present

## 2022-07-24 DIAGNOSIS — M542 Cervicalgia: Secondary | ICD-10-CM | POA: Insufficient documentation

## 2022-07-25 NOTE — Progress Notes (Signed)
Abnormal Xray, ?? Pending MRI

## 2022-07-26 ENCOUNTER — Telehealth: Payer: Self-pay | Admitting: Internal Medicine

## 2022-07-26 ENCOUNTER — Other Ambulatory Visit: Payer: Self-pay | Admitting: Internal Medicine

## 2022-07-26 DIAGNOSIS — M542 Cervicalgia: Secondary | ICD-10-CM

## 2022-07-26 NOTE — Telephone Encounter (Signed)
Orthopedic Surgery referral sent via [facility] to [facility]. -Toni 

## 2022-07-26 NOTE — Telephone Encounter (Signed)
Orthopedic appointment>> 07/31/22 with [facility]-Toni

## 2022-07-26 NOTE — Progress Notes (Signed)
Needs to see ortho for further testing and treatment

## 2022-07-27 NOTE — Progress Notes (Signed)
He has arthritis and pinch nerve. ( Its hard to explain, he can come for a visit)

## 2022-08-02 ENCOUNTER — Encounter: Payer: Self-pay | Admitting: Nurse Practitioner

## 2022-08-02 ENCOUNTER — Ambulatory Visit (INDEPENDENT_AMBULATORY_CARE_PROVIDER_SITE_OTHER): Payer: BC Managed Care – PPO | Admitting: Nurse Practitioner

## 2022-08-02 VITALS — BP 129/78 | HR 74 | Temp 98.3°F | Resp 16 | Ht 74.0 in | Wt 213.6 lb

## 2022-08-02 DIAGNOSIS — M5412 Radiculopathy, cervical region: Secondary | ICD-10-CM

## 2022-08-02 DIAGNOSIS — G8929 Other chronic pain: Secondary | ICD-10-CM | POA: Diagnosis not present

## 2022-08-02 DIAGNOSIS — Z1211 Encounter for screening for malignant neoplasm of colon: Secondary | ICD-10-CM | POA: Diagnosis not present

## 2022-08-02 DIAGNOSIS — M542 Cervicalgia: Secondary | ICD-10-CM | POA: Diagnosis not present

## 2022-08-02 MED ORDER — PREDNISONE 10 MG (21) PO TBPK: ORAL_TABLET | ORAL | 0 refills | Status: DC

## 2022-08-02 NOTE — Progress Notes (Signed)
[facility] [place], [place] [address]  Internal MEDICINE  Office Visit Note  Patient Name: Gabriel Burton  [id number]  [id number]  Date of Service: 08/02/2022  Chief Complaint  Patient presents with   Follow-up    Review results for pinch nerve     HPI Gabriel Burton presents for a follow-up visit for pinched nerve and to review xray. Discussed xray -- mild to mod foraminal narrowing, degenerative changes, some off and on compression of the nerve Pinched nerve with radiation to both arms, and some numbness in his left hand/ Currently taking gabapentin 300 mg PO BID Due for CRC screening -- Will schedule colonoscopy.    Current Medication: Outpatient Encounter Medications as of 08/02/2022  Medication Sig   amLODipine (NORVASC) 10 MG tablet Take 1 tablet (10 mg total) by mouth daily.   aspirin EC 81 MG tablet Take 81 mg by mouth daily.    atorvastatin (LIPITOR) 10 MG tablet Take 1 tablet (10 mg total) by mouth at bedtime.   carvedilol (COREG) 12.5 MG tablet TAKE 1 & 1/2 (ONE & ONE-HALF) TABLETS BY MOUTH TWICE DAILY WITH A MEAL   Cholecalciferol (VITAMIN D3) 5000 units TABS Take 5,000 Units by mouth daily.    CINNAMON PO Take by mouth daily.    clopidogrel (PLAVIX) 75 MG tablet Take 1 tablet (75 mg total) by mouth daily.   cyclobenzaprine (FLEXERIL) 5 MG tablet Take 1 tablet (5 mg total) by mouth at bedtime as needed for muscle spasms.   fluticasone (FLONASE) 50 MCG/ACT nasal spray Place 2 sprays into both nostrils daily.   gabapentin (NEURONTIN) 300 MG capsule Take one tab po bid for neck pain   linaclotide (LINZESS) 72 MCG capsule Take 1 capsule (72 mcg total) by mouth daily before breakfast.   lisinopril-hydrochlorothiazide (ZESTORETIC) 20-12.5 MG tablet TAKE 2 TABLETS BY MOUTH ONCE DAILY FOR HIGH BLOOD PRESSURE   nitroGLYCERIN (NITROSTAT) 0.4 MG SL tablet DISSOLVE ONE TABLET UNDER THE TONGUE EVERY 5 MINUTES AS NEEDED FOR CHEST PAIN.  DO NOT EXCEED A TOTAL OF  3 DOSES IN 15 MINUTES   pantoprazole (PROTONIX) 40 MG tablet Take 1 tablet (40 mg total) by mouth daily.   predniSONE (STERAPRED UNI-PAK 21 TAB) 10 MG (21) TBPK tablet Use as directed for 6 days   Semaglutide,0.25 or 0.5MG /DOS, (OZEMPIC, 0.25 OR 0.5 MG/DOSE,) 2 MG/3ML SOPN Inject 0.5 mg into the skin once a week.   spironolactone (ALDACTONE) 25 MG tablet Take 1 tablet by mouth once daily   tadalafil (CIALIS) 20 MG tablet Take 1 tablet (20 mg total) by mouth daily as needed for erectile dysfunction.   tamsulosin (FLOMAX) 0.4 MG CAPS capsule TAKE 1 CAPSULE BY MOUTH ONE-HALF HOUR AFTER THE SAME MEAL EACH DAY   TOUJEO SOLOSTAR 300 UNIT/ML Solostar Pen INJECT 40 UNITS SUBCUTANEOUSLY DAILY. MAY INCREASE UP TO 45 UNITS AS INDICATED.   traMADol (ULTRAM) 50 MG tablet One tab po bid prn for pain   TRUEPLUS 5-BEVEL PEN NEEDLES 32G X 4 MM MISC USE  TWICE DAILY AS DIRECTED   vitamin C (ASCORBIC ACID) 500 MG tablet Take 500 mg by mouth daily.    No facility-administered encounter medications on file as of 08/02/2022.    Surgical History: History reviewed. No pertinent surgical history.  Medical History: Past Medical History:  Diagnosis Date   Diabetes mellitus without complication (HCC)    Diastolic dysfunction    a. 09/2018 Echo: EF 63%, diast dysfxn. Mild conc LVH. Mild TR.  History of stress test    a. 09/2018 ETT: Ex time 3:50. BP 207/90. Inconclusive 2/2 LBBB.  Stopped 2/2 claudication; b. 12/2018 Lexi MV: fixed septal/apical defect - felt to be artifact 2/2 LBBB. Very mild cor Ca2+ and Ao atherosclerosis. Low-mod risk study. EF 43%.   Hyperlipidemia    Hypertension    a. 08/2020 Renal artery duplex: No evidence of RAS.   Left bundle branch block    Stroke [facility])     Family History: Family History  Problem Relation Age of Onset   Hypertension Mother    Hypertension Father    Heart attack Brother 24    Social History   Socioeconomic History   Marital status: Widowed    Spouse name: Not  on file   Number of children: Not on file   Years of education: Not on file   Highest education level: Not on file  Occupational History   Not on file  Tobacco Use   Smoking status: Former    Packs/day: 0.40    Years: 8.00    Total pack years: 3.20    Types: Cigarettes    Quit date: 2000    Years since quitting: 24.0   Smokeless tobacco: Never  Vaping Use   Vaping Use: Never used  Substance and Sexual Activity   Alcohol use: Never   Drug use: Never   Sexual activity: Not on file  Other Topics Concern   Not on file  Social History Narrative   Not on file   Social Determinants of Health   Financial Resource Strain: Not on file  Food Insecurity: Not on file  Transportation Needs: Not on file  Physical Activity: Not on file  Stress: Not on file  Social Connections: Not on file  Intimate Partner Violence: Not on file      Review of Systems  Constitutional:  Positive for fatigue.  Respiratory: Negative.  Negative for cough, chest tightness, shortness of breath and wheezing.   Cardiovascular: Negative.  Negative for chest pain and palpitations.  Musculoskeletal:  Positive for arthralgias, back pain, neck pain and neck stiffness.  Neurological:  Positive for weakness and headaches.  Psychiatric/Behavioral: Negative.      Vital Signs: BP 129/78   Pulse 74   Temp 98.3 F (36.8 C)   Resp 16   Ht 6\' 2"  (1.88 m)   Wt 213 lb 9.6 oz (96.9 kg)   SpO2 99%   BMI 27.42 kg/m    Physical Exam Constitutional:      General: He is not in acute distress.    Appearance: Normal appearance. He is not ill-appearing.  HENT:     Head: Normocephalic and atraumatic.  Eyes:     Pupils: Pupils are equal, round, and reactive to light.  Cardiovascular:     Rate and Rhythm: Normal rate and regular rhythm.  Pulmonary:     Effort: Pulmonary effort is normal. No respiratory distress.  Neurological:     Mental Status: He is alert and oriented to person, place, and time.  Psychiatric:         Mood and Affect: Mood normal.        Behavior: Behavior normal.        Assessment/Plan: 1. Cervical radiculopathy Take prednisone taper as prescribed. Also increase gabapentin to 1 capsule in the morning and 2 capsules at night. Call clinic if it works or not so that the prescription can be changed if necessary. Patient will reschedule his office visit with [facility]  to discuss a steroid injection for the neck.  - predniSONE (STERAPRED UNI-PAK 21 TAB) 10 MG (21) TBPK tablet; Use as directed for 6 days  Dispense: 21 tablet; Refill: 0  2. Chronic neck pain with abnormal neurologic examination Still presents even with gabapentin and tramadol, gabapentin dose increased at night. Will reevaluate.   3. Colon cancer screening Patient will call back to schedule his colonoscopy with GI. Will need to be off plavix for a few days before the procedure, patient is aware   General Counseling: Bently verbalizes understanding of the findings of todays visit and agrees with plan of treatment. I have discussed any further diagnostic evaluation that may be needed or ordered today. We also reviewed his medications today. he has been encouraged to call the office with any questions or concerns that should arise related to todays visit.    No orders of the defined types were placed in this encounter.   Meds ordered this encounter  Medications   predniSONE (STERAPRED UNI-PAK 21 TAB) 10 MG (21) TBPK tablet    Sig: Use as directed for 6 days    Dispense:  21 tablet    Refill:  0    Return for move march appt to late april please. , F/U, Chenelle Benning PCP.   Total time spent:30 Minutes Time spent includes review of chart, medications, test results, and follow up plan with the patient.   [place] Controlled Substance Database was reviewed by me.  This patient was seen by Jonetta Osgood, FNP-C in collaboration with Dr. Clayborn Bigness as a part of collaborative care agreement.   Chrystian Ressler R. Valetta Fuller, MSN,  FNP-C Internal medicine

## 2022-08-14 ENCOUNTER — Other Ambulatory Visit: Payer: Self-pay | Admitting: Internal Medicine

## 2022-08-14 DIAGNOSIS — E1140 Type 2 diabetes mellitus with diabetic neuropathy, unspecified: Secondary | ICD-10-CM

## 2022-08-15 ENCOUNTER — Telehealth: Payer: Self-pay

## 2022-08-15 NOTE — Progress Notes (Signed)
Carelink Summary Report / Loop Recorder

## 2022-08-21 ENCOUNTER — Ambulatory Visit: Payer: BC Managed Care – PPO

## 2022-08-21 DIAGNOSIS — I639 Cerebral infarction, unspecified: Secondary | ICD-10-CM | POA: Diagnosis not present

## 2022-08-22 LAB — CUP PACEART REMOTE DEVICE CHECK
Date Time Interrogation Session: 20240203174303
Implantable Pulse Generator Implant Date: 20231129

## 2022-09-21 ENCOUNTER — Encounter: Payer: Self-pay | Admitting: Internal Medicine

## 2022-09-21 ENCOUNTER — Ambulatory Visit: Payer: BC Managed Care – PPO | Attending: Internal Medicine | Admitting: Internal Medicine

## 2022-09-21 VITALS — BP 130/76 | HR 76 | Ht 74.0 in | Wt 214.0 lb

## 2022-09-21 DIAGNOSIS — E1159 Type 2 diabetes mellitus with other circulatory complications: Secondary | ICD-10-CM | POA: Diagnosis not present

## 2022-09-21 DIAGNOSIS — I10 Essential (primary) hypertension: Secondary | ICD-10-CM

## 2022-09-21 DIAGNOSIS — I639 Cerebral infarction, unspecified: Secondary | ICD-10-CM

## 2022-09-21 DIAGNOSIS — N1831 Chronic kidney disease, stage 3a: Secondary | ICD-10-CM | POA: Diagnosis not present

## 2022-09-21 DIAGNOSIS — I152 Hypertension secondary to endocrine disorders: Secondary | ICD-10-CM | POA: Diagnosis not present

## 2022-09-21 NOTE — Progress Notes (Unsigned)
Follow-up Outpatient Visit Date: 09/21/2022  Primary Care Provider: Jonetta Osgood, NP [place] [address]  Chief Complaint: Follow-up stroke and hypertension  HPI:  Mr. Toth is a 72 y.o. male with history of to cryptogenic stroke, hypertension, hyperlipidemia, type 2 diabetes mellitus, and chronic left bundle branch block, who presents for follow-up of open hypertension.  I last saw him in September shortly after he was noted to have multifocal acute infarcts.  At our follow-up visit, he continued to note some left upper quadrantanopsia as well as balance problems.  He he was subsequently referred to Dr. Quentin Ore and underwent implantable loop recorder placement, which thus far has not shown any evidence of atrial fibrillation/flutter.  Today, Mr. Haubner notes that he has been experiencing achiness in both arms, particularly at night.  He was recently diagnosed with cervical spine problems and has been started on gabapentin and a muscle relaxant without resolution in the symptoms.  He is scheduled to see a spine specialist tomorrow.  He wonders if any of his cardiac medications could be contributing to his symptoms.  He has not had any new focal neurologic deficits.  He also denies chest pain, shortness of breath, palpitations, and lightheadedness.  He is using compression stockings with good control of his leg edema.  He reports home BP readings typically in the 120's/70's.  He has noticed some recent constipation and inquires about potential treatments; he is using prn bisacodyl with good response.  --------------------------------------------------------------------------------------------------  Past Medical History:  Diagnosis Date   Diabetes mellitus without complication ([place])    Diastolic dysfunction    a. 09/2018 Echo: EF 63%, diast dysfxn. Mild conc LVH. Mild TR.   History of stress test    a. 09/2018 ETT: Ex time 3:50. BP 207/90. Inconclusive 2/2 LBBB.  Stopped  2/2 claudication; b. 12/2018 Lexi MV: fixed septal/apical defect - felt to be artifact 2/2 LBBB. Very mild cor Ca2+ and Ao atherosclerosis. Low-mod risk study. EF 43%.   Hyperlipidemia    Hypertension    a. 08/2020 Renal artery duplex: No evidence of RAS.   Left bundle branch block    Stroke [facility])    Past Surgical History:  Procedure Laterality Date   LOOP RECORDER IMPLANT  06/14/2022    Recent CV Pertinent Labs: Lab Results  Component Value Date   CHOL 128 01/05/2022   CHOL 150 07/25/2021   HDL 51 01/05/2022   HDL 57 07/25/2021   LDLCALC 64 01/05/2022   LDLCALC 75 07/25/2021   TRIG 63 01/05/2022   CHOLHDL 2.5 01/05/2022   INR 1.1 01/04/2022   K 3.9 01/04/2022   BUN 23 01/04/2022   BUN 21 07/25/2021   CREATININE 1.65 (H) 01/04/2022    Past medical and surgical history were reviewed and updated in EPIC.  Current Meds  Medication Sig   amLODipine (NORVASC) 10 MG tablet Take 1 tablet (10 mg total) by mouth daily.   aspirin EC 81 MG tablet Take 81 mg by mouth daily.    atorvastatin (LIPITOR) 10 MG tablet Take 1 tablet (10 mg total) by mouth at bedtime.   carvedilol (COREG) 12.5 MG tablet TAKE 1 & 1/2 (ONE & ONE-HALF) TABLETS BY MOUTH TWICE DAILY WITH A MEAL   Cholecalciferol (VITAMIN D3) 5000 units TABS Take 5,000 Units by mouth daily.    CINNAMON PO Take by mouth daily.    clopidogrel (PLAVIX) 75 MG tablet Take 1 tablet (75 mg total) by mouth daily.   cyclobenzaprine (FLEXERIL) 5  MG tablet Take 1 tablet (5 mg total) by mouth at bedtime as needed for muscle spasms.   fluticasone (FLONASE) 50 MCG/ACT nasal spray Place 2 sprays into both nostrils daily.   gabapentin (NEURONTIN) 300 MG capsule Take one tab po bid for neck pain   lisinopril-hydrochlorothiazide (ZESTORETIC) 20-12.5 MG tablet TAKE 2 TABLETS BY MOUTH ONCE DAILY FOR HIGH BLOOD PRESSURE   nitroGLYCERIN (NITROSTAT) 0.4 MG SL tablet DISSOLVE ONE TABLET UNDER THE TONGUE EVERY 5 MINUTES AS NEEDED FOR CHEST PAIN.  DO NOT  EXCEED A TOTAL OF 3 DOSES IN 15 MINUTES   pantoprazole (PROTONIX) 40 MG tablet Take 1 tablet (40 mg total) by mouth daily.   Semaglutide,0.25 or 0.5MG /DOS, (OZEMPIC, 0.25 OR 0.5 MG/DOSE,) 2 MG/3ML SOPN Inject 0.5 mg into the skin once a week.   spironolactone (ALDACTONE) 25 MG tablet Take 1 tablet by mouth once daily   tadalafil (CIALIS) 20 MG tablet Take 1 tablet (20 mg total) by mouth daily as needed for erectile dysfunction.   TOUJEO SOLOSTAR 300 UNIT/ML Solostar Pen INJECT 40 UNITS SUBCUTANEOUSLY ONCE DAILY. MAY INCREASE TO 45 UNITS AS INDICATED   traMADol (ULTRAM) 50 MG tablet One tab po bid prn for pain   TRUEPLUS 5-BEVEL PEN NEEDLES 32G X 4 MM MISC USE  TWICE DAILY AS DIRECTED   vitamin C (ASCORBIC ACID) 500 MG tablet Take 500 mg by mouth daily.     Allergies: Patient has no known allergies.  Social History   Tobacco Use   Smoking status: Former    Packs/day: 0.40    Years: 8.00    Total pack years: 3.20    Types: Cigarettes    Quit date: 2000    Years since quitting: 24.1   Smokeless tobacco: Never  Vaping Use   Vaping Use: Never used  Substance Use Topics   Alcohol use: Never   Drug use: Never    Family History  Problem Relation Age of Onset   Hypertension Mother    Hypertension Father    Heart attack Brother 70    Review of Systems: A 12-system review of systems was performed and was negative except as noted in the HPI.  --------------------------------------------------------------------------------------------------  Physical Exam: BP 130/76 (BP Location: Left Arm, Patient Position: Sitting, Cuff Size: Large)   Pulse 76   Ht 6\' 2"  (1.88 m)   Wt 214 lb (97.1 kg)   SpO2 98%   BMI 27.48 kg/m   General:  NAD. Neck: No JVD or HJR. Lungs: Clear to auscultation bilaterally without wheezes or crackles. Heart: Regular rate and rhythm without murmurs, rubs, or gallops. Abdomen: Soft, nontender, nondistended. Extremities: No lower extremity edema.  EKG:   Normal sinus rhythm with left axis deviation and left bundle branch block.  No significant change since 06/14/2022.  Lab Results  Component Value Date   WBC 6.0 01/04/2022   HGB 12.8 (L) 01/04/2022   HCT 39.6 01/04/2022   MCV 88.0 01/04/2022   PLT 201 01/04/2022    Lab Results  Component Value Date   NA 140 01/04/2022   K 3.9 01/04/2022   CL 106 01/04/2022   CO2 26 01/04/2022   BUN 23 01/04/2022   CREATININE 1.65 (H) 01/04/2022   GLUCOSE 143 (H) 01/04/2022   ALT 31 01/04/2022    Lab Results  Component Value Date   CHOL 128 01/05/2022   HDL 51 01/05/2022   LDLCALC 64 01/05/2022   TRIG 63 01/05/2022   CHOLHDL 2.5 01/05/2022    --------------------------------------------------------------------------------------------------  ASSESSMENT AND PLAN: Cryptogenic stroke and hyperlipidemia: No new focal neuro symptoms.  ILR has not shown any evidence of atrial fibrillation/flutter thus far.  Continue routine ILR follow-up with the device clinic.  Continue aspirin and clopidogrel for secondary prevention (could consider consolidation to clopidogrel monotherapy in the future).  Given arm pain, we will undertake a 3 week statin holiday.  If pain improves, we will need to consider switching to rosuvastatin.  He will contact us after statin holiday to update [place] on his symptoms.  Hypertension associated with type 2 diabetes mellitus and chronic kidney disease stage 3a: BP borderline today but typically better at home (goal BP <130/80).  Continue amlodipine, lisinopril-HCTZ, and spironolactone.  Continue management of DM per Dr. Humphrey Rolls.  We will recheck a BMP at Mr. Hedgepath convenience in the setting of CKD and long-term lisinopril, HCTZ, and spironolactone use.  Follow-up: Return to clinic in 6 months.  [name], MD 09/21/2022 10:02 AM

## 2022-09-21 NOTE — Patient Instructions (Signed)
Medication Instructions:  Hold Atorvastatin for 3 weeks. If arm pain continue, resume atorvastatin. If pain resolves, please contact our office.  Ok to take Miralax as needed for constipation   *If you need a refill on your cardiac medications before your next appointment, please call your pharmacy*   Lab Work: None ordered today   Testing/Procedures: None ordered today   Follow-Up: At [facility], you and your health needs are our priority.  As part of our continuing mission to provide you with exceptional heart care, we have created designated Provider Care Teams.  These Care Teams include your primary Cardiologist (physician) and Advanced Practice Providers (APPs -  Physician Assistants and Nurse Practitioners) who all work together to provide you with the care you need, when you need it.  We recommend signing up for the patient portal called "MyChart".  Sign up information is provided on this After Visit Summary.  MyChart is used to connect with patients for Virtual Visits (Telemedicine).  Patients are able to view lab/test results, encounter notes, upcoming appointments, etc.  Non-urgent messages can be sent to your provider as well.   To learn more about what you can do with MyChart, go to [email or web address].    Your next appointment:   6 month(s)  Provider:   You may see [name], MD or one of the following Advanced Practice Providers on your designated Care Team:   Murray Hodgkins, NP Christell Faith, PA-C Cadence Kathlen Mody, PA-C Gerrie Nordmann, NP

## 2022-09-23 ENCOUNTER — Encounter: Payer: Self-pay | Admitting: Internal Medicine

## 2022-09-25 ENCOUNTER — Ambulatory Visit: Payer: BC Managed Care – PPO

## 2022-09-25 DIAGNOSIS — I639 Cerebral infarction, unspecified: Secondary | ICD-10-CM | POA: Diagnosis not present

## 2022-09-25 LAB — CUP PACEART REMOTE DEVICE CHECK
Date Time Interrogation Session: 20240310231037
Implantable Pulse Generator Implant Date: 20231129

## 2022-09-26 ENCOUNTER — Telehealth: Payer: Self-pay

## 2022-09-26 ENCOUNTER — Other Ambulatory Visit: Payer: Self-pay | Admitting: Nurse Practitioner

## 2022-09-26 DIAGNOSIS — I10 Essential (primary) hypertension: Secondary | ICD-10-CM

## 2022-09-26 DIAGNOSIS — Z79899 Other long term (current) drug therapy: Secondary | ICD-10-CM

## 2022-09-26 DIAGNOSIS — N189 Chronic kidney disease, unspecified: Secondary | ICD-10-CM

## 2022-09-26 NOTE — Telephone Encounter (Signed)
Done

## 2022-09-26 NOTE — Telephone Encounter (Signed)
Call pt to inform him that Dr. Saunders Revel recommends completing a BMP at his convenience.  Pt made aware and verbalized understanding.

## 2022-10-02 ENCOUNTER — Other Ambulatory Visit
Admission: RE | Admit: 2022-10-02 | Discharge: 2022-10-02 | Disposition: A | Discharge status: Home / Self Care | Payer: BC Managed Care – PPO | Source: Ambulatory Visit | Attending: Internal Medicine | Admitting: Internal Medicine

## 2022-10-02 DIAGNOSIS — Z79899 Other long term (current) drug therapy: Secondary | ICD-10-CM

## 2022-10-02 LAB — BASIC METABOLIC PANEL
Anion gap: 11 (ref 5–15)
BUN: 43 mg/dL — ABNORMAL HIGH (ref 8–23)
CO2: 26 mmol/L (ref 22–32)
Calcium: 10.2 mg/dL (ref 8.9–10.3)
Chloride: 104 mmol/L (ref 98–111)
Creatinine, Ser: 1.89 mg/dL — ABNORMAL HIGH (ref 0.61–1.24)
GFR, Estimated: 38 mL/min — ABNORMAL LOW (ref >60)
Glucose, Bld: 103 mg/dL — ABNORMAL HIGH (ref 70–99)
Potassium: 4.6 mmol/L (ref 3.5–5.1)
Sodium: 141 mmol/L (ref 135–145)

## 2022-10-03 NOTE — Progress Notes (Signed)
Carelink Summary Report / Loop Recorder 

## 2022-10-05 ENCOUNTER — Ambulatory Visit: Payer: BC Managed Care – PPO | Admitting: Nurse Practitioner

## 2022-10-05 ENCOUNTER — Other Ambulatory Visit: Payer: Self-pay | Admitting: Nurse Practitioner

## 2022-10-05 DIAGNOSIS — E1140 Type 2 diabetes mellitus with diabetic neuropathy, unspecified: Secondary | ICD-10-CM

## 2022-10-05 NOTE — Addendum Note (Signed)
Addended by: Meryl Crutch on: 10/05/2022 01:29 PM   Modules accepted: Orders

## 2022-10-10 IMAGING — DX DG CHEST 1V PORT
1 series · 1 of 1 positions shown · non-contrast
Comparison: March 06, 2009

CLINICAL DATA: COVID positive.  Worsening symptoms.

EXAM:
PORTABLE CHEST 1 VIEW

[chest ap]
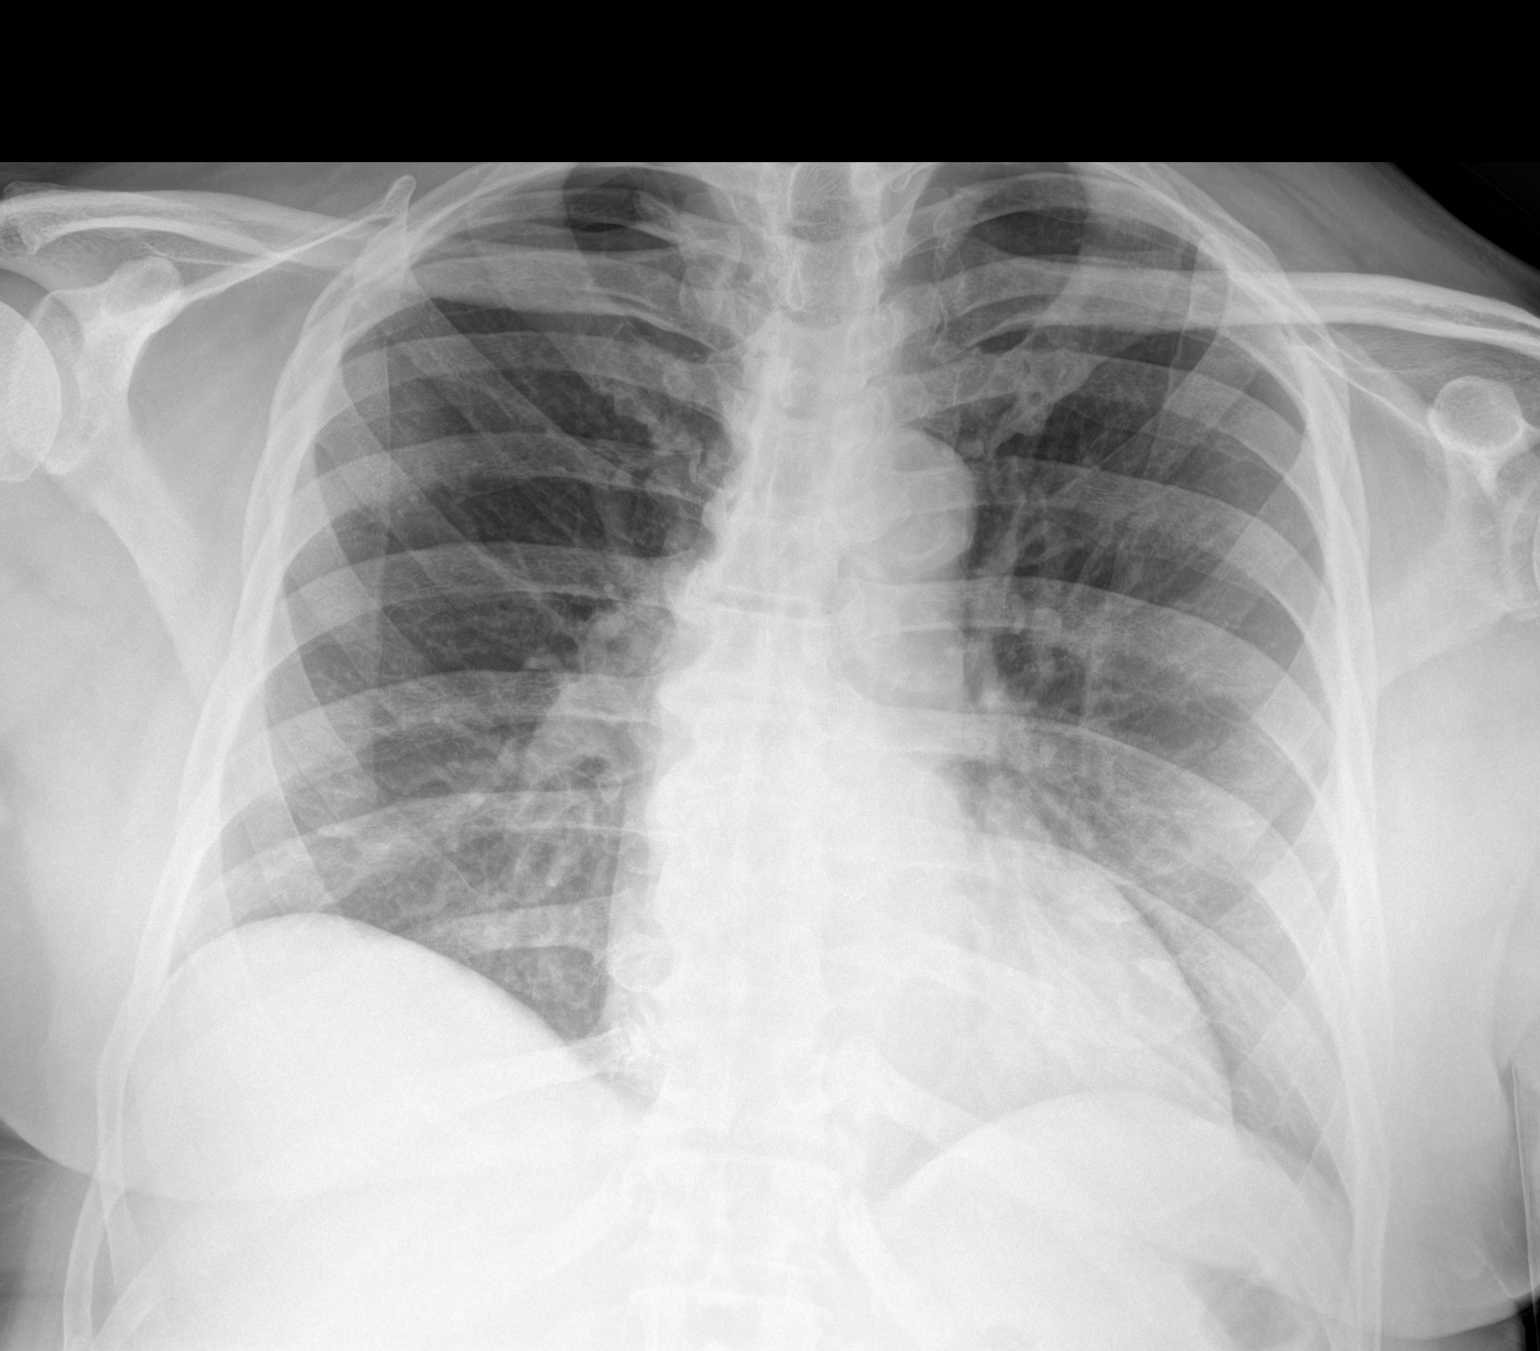

[1 of 1 positions shown; findings below may reference images not displayed]

FINDINGS: The heart size and mediastinal contours are within normal limits.
Both lungs are clear. The visualized skeletal structures are
unremarkable.
IMPRESSION: No active disease.

## 2022-10-30 ENCOUNTER — Ambulatory Visit (INDEPENDENT_AMBULATORY_CARE_PROVIDER_SITE_OTHER): Payer: BC Managed Care – PPO

## 2022-10-30 DIAGNOSIS — I639 Cerebral infarction, unspecified: Secondary | ICD-10-CM

## 2022-10-31 ENCOUNTER — Telehealth: Payer: Self-pay

## 2022-10-31 LAB — CUP PACEART REMOTE DEVICE CHECK
Date Time Interrogation Session: 20240412230648
Implantable Pulse Generator Implant Date: 20231129

## 2022-10-31 NOTE — Telephone Encounter (Signed)
Patient has been advised that his colonoscopy referral is from 06/2022. He will need a new referral sent from his PCP.  He said he will let his PCP know when he goes into his appt.  I will go ahead and request cardiac clearance.  Thanks, [place], [place]

## 2022-11-01 NOTE — Progress Notes (Signed)
Carelink Summary Report / Loop Recorder 

## 2022-11-02 ENCOUNTER — Other Ambulatory Visit: Payer: Self-pay | Admitting: Nurse Practitioner

## 2022-11-02 DIAGNOSIS — E1140 Type 2 diabetes mellitus with diabetic neuropathy, unspecified: Secondary | ICD-10-CM

## 2022-11-06 ENCOUNTER — Telehealth: Payer: Self-pay

## 2022-11-06 NOTE — Telephone Encounter (Signed)
Nephrology referral placed on 10/05/22 by Dr. Okey Dupre Nurse spoke with [facility] and confirmed pt has an appointment scheduled for 12/05/22 @ 10:20 am.

## 2022-11-08 ENCOUNTER — Other Ambulatory Visit: Payer: Self-pay | Admitting: Nurse Practitioner

## 2022-11-08 DIAGNOSIS — E1140 Type 2 diabetes mellitus with diabetic neuropathy, unspecified: Secondary | ICD-10-CM

## 2022-11-13 ENCOUNTER — Ambulatory Visit: Payer: BC Managed Care – PPO | Admitting: Nurse Practitioner

## 2022-11-20 ENCOUNTER — Ambulatory Visit (INDEPENDENT_AMBULATORY_CARE_PROVIDER_SITE_OTHER): Payer: BC Managed Care – PPO | Admitting: Nurse Practitioner

## 2022-11-20 ENCOUNTER — Encounter: Payer: Self-pay | Admitting: Nurse Practitioner

## 2022-11-20 VITALS — BP 130/72 | HR 79 | Temp 97.8°F | Resp 16 | Ht 74.0 in | Wt 213.0 lb

## 2022-11-20 DIAGNOSIS — E1140 Type 2 diabetes mellitus with diabetic neuropathy, unspecified: Secondary | ICD-10-CM | POA: Diagnosis not present

## 2022-11-20 DIAGNOSIS — M542 Cervicalgia: Secondary | ICD-10-CM

## 2022-11-20 DIAGNOSIS — M5412 Radiculopathy, cervical region: Secondary | ICD-10-CM

## 2022-11-20 DIAGNOSIS — K5909 Other constipation: Secondary | ICD-10-CM

## 2022-11-20 DIAGNOSIS — R300 Dysuria: Secondary | ICD-10-CM

## 2022-11-20 DIAGNOSIS — Z1211 Encounter for screening for malignant neoplasm of colon: Secondary | ICD-10-CM

## 2022-11-20 DIAGNOSIS — Z0001 Encounter for general adult medical examination with abnormal findings: Secondary | ICD-10-CM

## 2022-11-20 DIAGNOSIS — I10 Essential (primary) hypertension: Secondary | ICD-10-CM | POA: Diagnosis not present

## 2022-11-20 DIAGNOSIS — G8929 Other chronic pain: Secondary | ICD-10-CM

## 2022-11-20 DIAGNOSIS — Z794 Long term (current) use of insulin: Secondary | ICD-10-CM

## 2022-11-20 DIAGNOSIS — H53452 Other localized visual field defect, left eye: Secondary | ICD-10-CM

## 2022-11-20 DIAGNOSIS — I639 Cerebral infarction, unspecified: Secondary | ICD-10-CM

## 2022-11-20 LAB — POCT GLYCOSYLATED HEMOGLOBIN (HGB A1C): Hemoglobin A1C: 7.2 % — AB (ref 4.0–5.6)

## 2022-11-20 MED ORDER — GABAPENTIN 300 MG PO CAPS
ORAL_CAPSULE | ORAL | 0 refills | Status: DC
Start: 2022-11-20 — End: 2023-03-26

## 2022-11-20 MED ORDER — HYDROCODONE-ACETAMINOPHEN 5-325 MG PO TABS
1.0000 | ORAL_TABLET | ORAL | 0 refills | Status: AC | PRN
Start: 2022-11-20 — End: 2022-11-25

## 2022-11-20 NOTE — Progress Notes (Signed)
[facility] [address] [place], [place] [address]  Internal MEDICINE  Office Visit Note  Patient Name: Gabriel Burton  [id number]  [id number]  Date of Service: 11/20/2022  Chief Complaint  Patient presents with   Hypertension   Hyperlipidemia   Diabetes   Follow-up    HPI Gabriel Burton presents for a follow-up visit for diabetes, hypertension, high cholesterol.  Stopped cholesterol pill but the neck pain did not get better Prednisone taper helped a little bit but then it got worse.  Neck pain is still bad, xray of cervical spine shows a lot of degernative changes.  Pain that radiates down both arms.  A1c 7.2, no changes -- trouble getting mounjaro due to backorder.   Current Medication: Outpatient Encounter Medications as of 11/20/2022  Medication Sig   amLODipine (NORVASC) 10 MG tablet Take 1 tablet (10 mg total) by mouth daily.   aspirin EC 81 MG tablet Take 81 mg by mouth daily.    atorvastatin (LIPITOR) 10 MG tablet Take 1 tablet (10 mg total) by mouth at bedtime.   carvedilol (COREG) 12.5 MG tablet TAKE 1 & 1/2 (ONE & ONE-HALF) TABLETS BY MOUTH TWICE DAILY WITH A MEAL   Cholecalciferol (VITAMIN D3) 5000 units TABS Take 5,000 Units by mouth daily.    CINNAMON PO Take by mouth daily.    clopidogrel (PLAVIX) 75 MG tablet Take 1 tablet (75 mg total) by mouth daily.   cyclobenzaprine (FLEXERIL) 5 MG tablet Take 1 tablet (5 mg total) by mouth at bedtime as needed for muscle spasms.   fluticasone (FLONASE) 50 MCG/ACT nasal spray Place 2 sprays into both nostrils daily.   HYDROcodone-acetaminophen (NORCO/VICODIN) 5-325 MG tablet Take 1 tablet by mouth every 4 (four) hours as needed for up to 5 days for moderate pain or severe pain.   lisinopril-hydrochlorothiazide (ZESTORETIC) 20-12.5 MG tablet TAKE 2 TABLETS BY MOUTH ONCE DAILY FOR HIGH BLOOD PRESSURE   nitroGLYCERIN (NITROSTAT) 0.4 MG SL tablet DISSOLVE ONE TABLET UNDER THE TONGUE EVERY 5 MINUTES AS NEEDED FOR CHEST PAIN.   DO NOT EXCEED A TOTAL OF 3 DOSES IN 15 MINUTES   OZEMPIC, 0.25 OR 0.5 MG/DOSE, 2 MG/3ML SOPN INJECT 0.5 MG INTO THE SKIN ONCE A WEEK   pantoprazole (PROTONIX) 40 MG tablet Take 1 tablet (40 mg total) by mouth daily.   spironolactone (ALDACTONE) 25 MG tablet Take 1 tablet by mouth once daily   tadalafil (CIALIS) 20 MG tablet Take 1 tablet (20 mg total) by mouth daily as needed for erectile dysfunction.   TOUJEO SOLOSTAR 300 UNIT/ML Solostar Pen INJECT 40 UNITS SUBCUTANEOUSLY ONCE DAILY.  MAY INCREASE TO 45 UNITS AS INDICATED   traMADol (ULTRAM) 50 MG tablet One tab po bid prn for pain   TRUEPLUS 5-BEVEL PEN NEEDLES 32G X 4 MM MISC USE  TWICE DAILY AS DIRECTED   vitamin C (ASCORBIC ACID) 500 MG tablet Take 500 mg by mouth daily.    [DISCONTINUED] gabapentin (NEURONTIN) 300 MG capsule Take one tab po bid for neck pain   gabapentin (NEURONTIN) 300 MG capsule Take 1 tablet by mouth in the morning and 2 tablets by mouth in the evening every day.   No facility-administered encounter medications on file as of 11/20/2022.    Surgical History: Past Surgical History:  Procedure Laterality Date   LOOP RECORDER IMPLANT  06/14/2022    Medical History: Past Medical History:  Diagnosis Date   Diabetes mellitus without complication (H[facility])    Diastolic dysfunction  a. 09/2018 Echo: EF 63%, diast dysfxn. Mild conc LVH. Mild TR.   History of stress test    a. 09/2018 ETT: Ex time 3:50. BP 207/90. Inconclusive 2/2 LBBB.  Stopped 2/2 claudication; b. 12/2018 Lexi MV: fixed septal/apical defect - felt to be artifact 2/2 LBBB. Very mild cor Ca2+ and Ao atherosclerosis. Low-mod risk study. EF 43%.   Hyperlipidemia    Hypertension    a. 08/2020 Renal artery duplex: No evidence of RAS.   Left bundle branch block    Stroke [facility])     Family History: Family History  Problem Relation Age of Onset   Hypertension Mother    Hypertension Father    Heart attack Brother 96    Social History   Socioeconomic  History   Marital status: Widowed    Spouse name: Not on file   Number of children: Not on file   Years of education: Not on file   Highest education level: Not on file  Occupational History   Not on file  Tobacco Use   Smoking status: Former    Packs/day: 0.40    Years: 8.00    Additional pack years: 0.00    Total pack years: 3.20    Types: Cigarettes    Quit date: 2000    Years since quitting: 24.3   Smokeless tobacco: Never  Vaping Use   Vaping Use: Never used  Substance and Sexual Activity   Alcohol use: Never   Drug use: Never   Sexual activity: Not on file  Other Topics Concern   Not on file  Social History Narrative   Not on file   Social Determinants of Health   Financial Resource Strain: Not on file  Food Insecurity: Not on file  Transportation Needs: Not on file  Physical Activity: Not on file  Stress: Not on file  Social Connections: Not on file  Intimate Partner Violence: Not on file      Review of Systems  Constitutional:  Positive for fatigue.  Respiratory: Negative.  Negative for cough, chest tightness, shortness of breath and wheezing.   Cardiovascular: Negative.  Negative for chest pain and palpitations.  Musculoskeletal:  Positive for arthralgias, back pain, neck pain and neck stiffness.  Neurological:  Positive for weakness and headaches.  Psychiatric/Behavioral: Negative.      Vital Signs: BP 130/72   Pulse 79   Temp 97.8 F (36.6 C)   Resp 16   Ht 6\' 2"  (1.88 m)   Wt 213 lb (96.6 kg)   SpO2 97%   BMI 27.35 kg/m    Physical Exam Vitals reviewed.  Constitutional:      General: He is not in acute distress.    Appearance: Normal appearance. He is not ill-appearing.  HENT:     Head: Normocephalic and atraumatic.  Eyes:     Pupils: Pupils are equal, round, and reactive to light.  Cardiovascular:     Rate and Rhythm: Normal rate and regular rhythm.  Pulmonary:     Effort: Pulmonary effort is normal. No respiratory distress.   Neurological:     Mental Status: He is alert and oriented to person, place, and time.  Psychiatric:        Mood and Affect: Mood normal.        Behavior: Behavior normal.        Assessment/Plan: 1. Type 2 diabetes mellitus with diabetic neuropathy, with long-term current use of insulin (HCC) No change in A1c, continue current regimen and follow  diet and lifestyle modifications as discussed. Continue gabapentin as prescribed for any neuropathy. Follow up in 4 months to repeat A1c.  - POCT glycosylated hemoglobin (Hb A1C) - gabapentin (NEURONTIN) 300 MG capsule; Take 1 tablet by mouth in the morning and 2 tablets by mouth in the evening every day.  Dispense: 270 capsule; Refill: 0  2. Primary hypertension Stable, continue amlodipine, carvedilol, spironolactone, and lisinopril-HCTZ as prescribed.   3. Cervical radiculopathy Follow up with orthopedic specialist.  Continue gabapentin and hydrocodone for pain as prescribed.  - gabapentin (NEURONTIN) 300 MG capsule; Take 1 tablet by mouth in the morning and 2 tablets by mouth in the evening every day.  Dispense: 270 capsule; Refill: 0 - HYDROcodone-acetaminophen (NORCO/VICODIN) 5-325 MG tablet; Take 1 tablet by mouth every 4 (four) hours as needed for up to 5 days for moderate pain or severe pain.  Dispense: 30 tablet; Refill: 0  4. Chronic neck pain with abnormal neurologic examination Continue gabapentin and hydrocodone as prescribed.  - gabapentin (NEURONTIN) 300 MG capsule; Take 1 tablet by mouth in the morning and 2 tablets by mouth in the evening every day.  Dispense: 270 capsule; Refill: 0 - HYDROcodone-acetaminophen (NORCO/VICODIN) 5-325 MG tablet; Take 1 tablet by mouth every 4 (four) hours as needed for up to 5 days for moderate pain or severe pain.  Dispense: 30 tablet; Refill: 0   General Counseling: Davionne verbalizes understanding of the findings of todays visit and agrees with plan of treatment. I have discussed any further  diagnostic evaluation that may be needed or ordered today. We also reviewed his medications today. he has been encouraged to call the office with any questions or concerns that should arise related to todays visit.    Orders Placed This Encounter  Procedures   POCT glycosylated hemoglobin (Hb A1C)    Meds ordered this encounter  Medications   gabapentin (NEURONTIN) 300 MG capsule    Sig: Take 1 tablet by mouth in the morning and 2 tablets by mouth in the evening every day.    Dispense:  270 capsule    Refill:  0   HYDROcodone-acetaminophen (NORCO/VICODIN) 5-325 MG tablet    Sig: Take 1 tablet by mouth every 4 (four) hours as needed for up to 5 days for moderate pain or severe pain.    Dispense:  30 tablet    Refill:  0    Fill first prescription today    Return in about 4 months (around 03/23/2023) for F/U, Recheck A1C, Daveena Elmore PCP.   Total time spent:30 Minutes Time spent includes review of chart, medications, test results, and follow up plan with the patient.   [place] Controlled Substance Database was reviewed by me.  This patient was seen by Sallyanne Kuster, FNP-C in collaboration with Dr. Beverely Risen as a part of collaborative care agreement.   Andilynn Delavega R. Tedd Sias, MSN, FNP-C Internal medicine

## 2022-11-21 ENCOUNTER — Telehealth: Payer: Self-pay

## 2022-11-24 MED ORDER — OLMESARTAN MEDOXOMIL 20 MG PO TABS: 20.0000 mg | ORAL_TABLET | Freq: Every day | ORAL | 3 refills | Status: DC

## 2022-11-24 NOTE — Telephone Encounter (Signed)
Patient notified

## 2022-11-30 LAB — CUP PACEART REMOTE DEVICE CHECK
Date Time Interrogation Session: 20240515230954
Implantable Pulse Generator Implant Date: 20231129

## 2022-12-01 NOTE — Progress Notes (Signed)
Carelink Summary Report / Loop Recorder 

## 2022-12-02 ENCOUNTER — Other Ambulatory Visit: Payer: Self-pay | Admitting: Internal Medicine

## 2022-12-02 ENCOUNTER — Encounter: Payer: Self-pay | Admitting: Nurse Practitioner

## 2022-12-04 ENCOUNTER — Ambulatory Visit (INDEPENDENT_AMBULATORY_CARE_PROVIDER_SITE_OTHER): Payer: BC Managed Care – PPO

## 2022-12-04 ENCOUNTER — Other Ambulatory Visit: Payer: Self-pay | Admitting: Nurse Practitioner

## 2022-12-04 DIAGNOSIS — I639 Cerebral infarction, unspecified: Secondary | ICD-10-CM | POA: Diagnosis not present

## 2022-12-04 DIAGNOSIS — E1140 Type 2 diabetes mellitus with diabetic neuropathy, unspecified: Secondary | ICD-10-CM

## 2022-12-15 ENCOUNTER — Other Ambulatory Visit: Payer: Self-pay | Admitting: Nurse Practitioner

## 2022-12-15 DIAGNOSIS — E1140 Type 2 diabetes mellitus with diabetic neuropathy, unspecified: Secondary | ICD-10-CM

## 2022-12-18 ENCOUNTER — Other Ambulatory Visit: Payer: Self-pay

## 2022-12-18 DIAGNOSIS — E1140 Type 2 diabetes mellitus with diabetic neuropathy, unspecified: Secondary | ICD-10-CM

## 2022-12-18 MED ORDER — TOUJEO SOLOSTAR 300 UNIT/ML ~~LOC~~ SOPN
PEN_INJECTOR | SUBCUTANEOUS | 0 refills | Status: DC
Start: 2022-12-18 — End: 2022-12-18

## 2022-12-18 MED ORDER — TOUJEO SOLOSTAR 300 UNIT/ML ~~LOC~~ SOPN
PEN_INJECTOR | SUBCUTANEOUS | 0 refills | Status: DC
Start: 2022-12-18 — End: 2023-01-16

## 2022-12-29 NOTE — Progress Notes (Signed)
Carelink Summary Report / Loop Recorder 

## 2023-01-06 ENCOUNTER — Other Ambulatory Visit: Payer: Self-pay | Admitting: Nurse Practitioner

## 2023-01-08 ENCOUNTER — Ambulatory Visit (INDEPENDENT_AMBULATORY_CARE_PROVIDER_SITE_OTHER): Payer: BC Managed Care – PPO

## 2023-01-08 DIAGNOSIS — I639 Cerebral infarction, unspecified: Secondary | ICD-10-CM | POA: Diagnosis not present

## 2023-01-08 LAB — CUP PACEART REMOTE DEVICE CHECK
Date Time Interrogation Session: 20240623230635
Implantable Pulse Generator Implant Date: 20231129

## 2023-01-10 ENCOUNTER — Other Ambulatory Visit: Payer: Self-pay | Admitting: Physician Assistant

## 2023-01-10 ENCOUNTER — Other Ambulatory Visit: Payer: Self-pay | Admitting: Nurse Practitioner

## 2023-01-10 DIAGNOSIS — M7541 Impingement syndrome of right shoulder: Secondary | ICD-10-CM

## 2023-01-10 DIAGNOSIS — E1140 Type 2 diabetes mellitus with diabetic neuropathy, unspecified: Secondary | ICD-10-CM

## 2023-01-16 ENCOUNTER — Other Ambulatory Visit: Payer: Self-pay | Admitting: Nurse Practitioner

## 2023-01-16 DIAGNOSIS — E1140 Type 2 diabetes mellitus with diabetic neuropathy, unspecified: Secondary | ICD-10-CM

## 2023-01-18 ENCOUNTER — Other Ambulatory Visit: Payer: Self-pay | Admitting: Nurse Practitioner

## 2023-01-18 ENCOUNTER — Other Ambulatory Visit: Payer: Self-pay | Admitting: Internal Medicine

## 2023-01-18 DIAGNOSIS — Z0001 Encounter for general adult medical examination with abnormal findings: Secondary | ICD-10-CM

## 2023-01-18 DIAGNOSIS — R300 Dysuria: Secondary | ICD-10-CM

## 2023-01-18 DIAGNOSIS — E1140 Type 2 diabetes mellitus with diabetic neuropathy, unspecified: Secondary | ICD-10-CM

## 2023-01-18 DIAGNOSIS — H53452 Other localized visual field defect, left eye: Secondary | ICD-10-CM

## 2023-01-18 DIAGNOSIS — I639 Cerebral infarction, unspecified: Secondary | ICD-10-CM

## 2023-01-18 DIAGNOSIS — K5909 Other constipation: Secondary | ICD-10-CM

## 2023-01-18 DIAGNOSIS — Z1211 Encounter for screening for malignant neoplasm of colon: Secondary | ICD-10-CM

## 2023-01-18 DIAGNOSIS — G8929 Other chronic pain: Secondary | ICD-10-CM

## 2023-01-25 NOTE — Progress Notes (Signed)
Carelink Summary Report / Loop Recorder 

## 2023-01-26 ENCOUNTER — Encounter: Payer: Self-pay | Admitting: Physician Assistant

## 2023-01-29 ENCOUNTER — Ambulatory Visit
Admission: RE | Admit: 2023-01-29 | Discharge: 2023-01-29 | Disposition: A | Discharge status: Home / Self Care | Payer: BC Managed Care – PPO | Source: Ambulatory Visit | Attending: Physician Assistant | Admitting: Physician Assistant

## 2023-01-29 DIAGNOSIS — M7541 Impingement syndrome of right shoulder: Secondary | ICD-10-CM

## 2023-02-09 ENCOUNTER — Other Ambulatory Visit: Payer: Self-pay | Admitting: Nurse Practitioner

## 2023-02-09 DIAGNOSIS — E1140 Type 2 diabetes mellitus with diabetic neuropathy, unspecified: Secondary | ICD-10-CM

## 2023-02-10 ENCOUNTER — Other Ambulatory Visit: Payer: Self-pay | Admitting: Nurse Practitioner

## 2023-02-10 DIAGNOSIS — E1140 Type 2 diabetes mellitus with diabetic neuropathy, unspecified: Secondary | ICD-10-CM

## 2023-02-12 ENCOUNTER — Ambulatory Visit (INDEPENDENT_AMBULATORY_CARE_PROVIDER_SITE_OTHER): Payer: BC Managed Care – PPO

## 2023-02-12 DIAGNOSIS — I639 Cerebral infarction, unspecified: Secondary | ICD-10-CM | POA: Diagnosis not present

## 2023-02-18 ENCOUNTER — Other Ambulatory Visit: Payer: Self-pay | Admitting: Nurse Practitioner

## 2023-02-21 ENCOUNTER — Other Ambulatory Visit: Payer: Self-pay | Admitting: Nurse Practitioner

## 2023-02-27 NOTE — Progress Notes (Signed)
Carelink Summary Report / Loop Recorder 

## 2023-03-11 ENCOUNTER — Other Ambulatory Visit: Payer: Self-pay | Admitting: Nurse Practitioner

## 2023-03-11 DIAGNOSIS — E1140 Type 2 diabetes mellitus with diabetic neuropathy, unspecified: Secondary | ICD-10-CM

## 2023-03-15 LAB — CUP PACEART REMOTE DEVICE CHECK
Date Time Interrogation Session: 20240828230810
Implantable Pulse Generator Implant Date: 20231129

## 2023-03-20 ENCOUNTER — Ambulatory Visit (INDEPENDENT_AMBULATORY_CARE_PROVIDER_SITE_OTHER): Payer: BC Managed Care – PPO

## 2023-03-20 DIAGNOSIS — I639 Cerebral infarction, unspecified: Secondary | ICD-10-CM | POA: Diagnosis not present

## 2023-03-23 ENCOUNTER — Other Ambulatory Visit: Payer: Self-pay

## 2023-03-23 ENCOUNTER — Other Ambulatory Visit: Payer: Self-pay | Admitting: Nurse Practitioner

## 2023-03-23 MED ORDER — OLMESARTAN MEDOXOMIL 20 MG PO TABS: 20.0000 mg | ORAL_TABLET | Freq: Every day | ORAL | 3 refills | Status: DC

## 2023-03-26 ENCOUNTER — Ambulatory Visit (INDEPENDENT_AMBULATORY_CARE_PROVIDER_SITE_OTHER): Payer: BC Managed Care – PPO | Admitting: Nurse Practitioner

## 2023-03-26 ENCOUNTER — Encounter: Payer: Self-pay | Admitting: Nurse Practitioner

## 2023-03-26 VITALS — BP 138/75 | HR 72 | Temp 98.4°F | Resp 16 | Ht 74.0 in | Wt 214.4 lb

## 2023-03-26 DIAGNOSIS — N528 Other male erectile dysfunction: Secondary | ICD-10-CM | POA: Diagnosis not present

## 2023-03-26 DIAGNOSIS — Z23 Encounter for immunization: Secondary | ICD-10-CM

## 2023-03-26 DIAGNOSIS — E1140 Type 2 diabetes mellitus with diabetic neuropathy, unspecified: Secondary | ICD-10-CM | POA: Diagnosis not present

## 2023-03-26 DIAGNOSIS — Z794 Long term (current) use of insulin: Secondary | ICD-10-CM

## 2023-03-26 DIAGNOSIS — I10 Essential (primary) hypertension: Secondary | ICD-10-CM

## 2023-03-26 DIAGNOSIS — Z79899 Other long term (current) drug therapy: Secondary | ICD-10-CM

## 2023-03-26 DIAGNOSIS — Z1211 Encounter for screening for malignant neoplasm of colon: Secondary | ICD-10-CM

## 2023-03-26 LAB — POCT GLYCOSYLATED HEMOGLOBIN (HGB A1C): Hemoglobin A1C: 6.4 % — AB (ref 4.0–5.6)

## 2023-03-26 MED ORDER — GABAPENTIN 300 MG PO CAPS
ORAL_CAPSULE | ORAL | 0 refills | Status: DC
Start: 2023-03-26 — End: 2023-11-14

## 2023-03-26 MED ORDER — CLOPIDOGREL BISULFATE 75 MG PO TABS
75.0000 mg | ORAL_TABLET | Freq: Every day | ORAL | 1 refills | Status: DC
Start: 2023-03-26 — End: 2023-10-22

## 2023-03-26 MED ORDER — OZEMPIC (0.25 OR 0.5 MG/DOSE) 2 MG/3ML ~~LOC~~ SOPN
PEN_INJECTOR | SUBCUTANEOUS | 5 refills | Status: DC
Start: 2023-03-26 — End: 2023-10-02

## 2023-03-26 MED ORDER — TADALAFIL 20 MG PO TABS
20.0000 mg | ORAL_TABLET | Freq: Every day | ORAL | 11 refills | Status: DC | PRN
Start: 2023-03-26 — End: 2023-03-26

## 2023-03-26 MED ORDER — OLMESARTAN MEDOXOMIL 20 MG PO TABS: 20.0000 mg | ORAL_TABLET | Freq: Every day | ORAL | 3 refills | Status: DC

## 2023-03-26 MED ORDER — ATORVASTATIN CALCIUM 10 MG PO TABS
10.0000 mg | ORAL_TABLET | Freq: Every day | ORAL | 3 refills | Status: DC
Start: 2023-03-26 — End: 2024-02-13

## 2023-03-26 MED ORDER — TOUJEO SOLOSTAR 300 UNIT/ML ~~LOC~~ SOPN
PEN_INJECTOR | SUBCUTANEOUS | 0 refills | Status: DC
Start: 2023-03-26 — End: 2023-05-14

## 2023-03-26 MED ORDER — TADALAFIL 20 MG PO TABS: 20.0000 mg | ORAL_TABLET | Freq: Every day | ORAL | 11 refills | Status: DC | PRN

## 2023-03-26 MED ORDER — AMLODIPINE BESYLATE 10 MG PO TABS
10.0000 mg | ORAL_TABLET | Freq: Every day | ORAL | 0 refills | Status: DC
Start: 2023-03-26 — End: 2023-07-13

## 2023-03-26 MED ORDER — PANTOPRAZOLE SODIUM 40 MG PO TBEC
40.0000 mg | DELAYED_RELEASE_TABLET | Freq: Every day | ORAL | 3 refills | Status: DC
Start: 2023-03-26 — End: 2024-02-13

## 2023-03-26 NOTE — Progress Notes (Signed)
[facility] [address] [place], [place] [address]  Internal MEDICINE  Office Visit Note  Patient Name: Gabriel Burton  [id number]  [id number]  Date of Service: 03/26/2023  Chief Complaint  Patient presents with   Diabetes   Hypertension   Hyperlipidemia   Follow-up    HPI Abubakar presents for a follow-up visit for diabetes, hypertension, flu shot, refills.  Diabetes -- A1c improved to 6.4 today from 7.2 in may. Tolerating medications, no issues at this time. Hypertension -- had episode of angioedema with lisinopril recently. Lisinopril was discontinued and he was switched to olmesartan which he is tolerating well. Denies any medication side effects and reports home BP averages 130s/80s most of the time.  Due for flu shot -- questionnaire completed and signed. Wants to get flu vaccine in office today.  Multiple refills -- due for several refills, routine.  Also overdue for medicare wellness visit, will schedule this for his next office visit.     Current Medication: Outpatient Encounter Medications as of 03/26/2023  Medication Sig   aspirin EC 81 MG tablet Take 81 mg by mouth daily.    carvedilol (COREG) 12.5 MG tablet TAKE 1 & 1/2 (ONE & ONE-HALF) TABLETS BY MOUTH TWICE DAILY WITH A MEAL   Cholecalciferol (VITAMIN D3) 5000 units TABS Take 5,000 Units by mouth daily.    CINNAMON PO Take by mouth daily.    cyclobenzaprine (FLEXERIL) 5 MG tablet Take 1 tablet (5 mg total) by mouth at bedtime as needed for muscle spasms.   fluticasone (FLONASE) 50 MCG/ACT nasal spray Place 2 sprays into both nostrils daily.   nitroGLYCERIN (NITROSTAT) 0.4 MG SL tablet DISSOLVE ONE TABLET UNDER THE TONGUE EVERY 5 MINUTES AS NEEDED FOR CHEST PAIN.  DO NOT EXCEED A TOTAL OF 3 DOSES IN 15 MINUTES   spironolactone (ALDACTONE) 25 MG tablet Take 1 tablet by mouth once daily   traMADol (ULTRAM) 50 MG tablet One tab po bid prn for pain   TRUEPLUS 5-BEVEL PEN NEEDLES 32G X 4 MM MISC USE  TWICE  DAILY AS DIRECTED   vitamin C (ASCORBIC ACID) 500 MG tablet Take 500 mg by mouth daily.    [DISCONTINUED] amLODipine (NORVASC) 10 MG tablet Take 1 tablet by mouth once daily   [DISCONTINUED] atorvastatin (LIPITOR) 10 MG tablet Take 1 tablet (10 mg total) by mouth at bedtime.   [DISCONTINUED] clopidogrel (PLAVIX) 75 MG tablet Take 1 tablet by mouth once daily   [DISCONTINUED] gabapentin (NEURONTIN) 300 MG capsule Take 1 tablet by mouth in the morning and 2 tablets by mouth in the evening every day.   [DISCONTINUED] olmesartan (BENICAR) 20 MG tablet Take 1 tablet (20 mg total) by mouth daily.   [DISCONTINUED] OZEMPIC, 0.25 OR 0.5 MG/DOSE, 2 MG/3ML SOPN INJECT 0.5 MG UNDER THE SKIN ONCE WEEKLY   [DISCONTINUED] pantoprazole (PROTONIX) 40 MG tablet Take 1 tablet (40 mg total) by mouth daily.   [DISCONTINUED] tadalafil (CIALIS) 20 MG tablet Take 1 tablet (20 mg total) by mouth daily as needed for erectile dysfunction.   [DISCONTINUED] TOUJEO SOLOSTAR 300 UNIT/ML Solostar Pen INJECT 40 UNITS SUBCUTANEOUSLY ONCE DAILY. MAY INCREASE TO 45 UNITS AS INDICATED.   amLODipine (NORVASC) 10 MG tablet Take 1 tablet (10 mg total) by mouth daily.   atorvastatin (LIPITOR) 10 MG tablet Take 1 tablet (10 mg total) by mouth at bedtime.   clopidogrel (PLAVIX) 75 MG tablet Take 1 tablet (75 mg total) by mouth daily.   gabapentin (NEURONTIN) 300 MG  capsule Take 1 tablet by mouth in the morning and 2 tablets by mouth in the evening every day.   insulin glargine, 1 Unit Dial, (TOUJEO SOLOSTAR) 300 UNIT/ML Solostar Pen Inject 40 units subcutaneously once daily. May increase to 45 units as indicated.   olmesartan (BENICAR) 20 MG tablet Take 1 tablet (20 mg total) by mouth daily.   pantoprazole (PROTONIX) 40 MG tablet Take 1 tablet (40 mg total) by mouth daily.   Semaglutide,0.25 or 0.5MG /DOS, (OZEMPIC, 0.25 OR 0.5 MG/DOSE,) 2 MG/3ML SOPN INJECT 0.5 MG UNDER THE SKIN ONCE WEEKLY   tadalafil (CIALIS) 20 MG tablet Take 1 tablet  (20 mg total) by mouth daily as needed for erectile dysfunction.   No facility-administered encounter medications on file as of 03/26/2023.    Surgical History: Past Surgical History:  Procedure Laterality Date   LOOP RECORDER IMPLANT  06/14/2022    Medical History: Past Medical History:  Diagnosis Date   Diabetes mellitus without complication (HCC)    Diastolic dysfunction    a. 09/2018 Echo: EF 63%, diast dysfxn. Mild conc LVH. Mild TR.   History of stress test    a. 09/2018 ETT: Ex time 3:50. BP 207/90. Inconclusive 2/2 LBBB.  Stopped 2/2 claudication; b. 12/2018 Lexi MV: fixed septal/apical defect - felt to be artifact 2/2 LBBB. Very mild cor Ca2+ and Ao atherosclerosis. Low-mod risk study. EF 43%.   Hyperlipidemia    Hypertension    a. 08/2020 Renal artery duplex: No evidence of RAS.   Left bundle branch block    Stroke [facility])     Family History: Family History  Problem Relation Age of Onset   Hypertension Mother    Hypertension Father    Heart attack Brother 70    Social History   Socioeconomic History   Marital status: Widowed    Spouse name: Not on file   Number of children: Not on file   Years of education: Not on file   Highest education level: Not on file  Occupational History   Not on file  Tobacco Use   Smoking status: Former    Current packs/day: 0.00    Average packs/day: 0.4 packs/day for 8.0 years (3.2 ttl pk-yrs)    Types: Cigarettes    Start date: 67    Quit date: 2000    Years since quitting: 24.7   Smokeless tobacco: Never  Vaping Use   Vaping status: Never Used  Substance and Sexual Activity   Alcohol use: Never   Drug use: Never   Sexual activity: Not on file  Other Topics Concern   Not on file  Social History Narrative   Not on file   Social Determinants of Health   Financial Resource Strain: Not on file  Food Insecurity: Not on file  Transportation Needs: Not on file  Physical Activity: Not on file  Stress: Not on file  Social  Connections: Not on file  Intimate Partner Violence: Not on file      Review of Systems  Constitutional:  Positive for fatigue. Negative for appetite change.  HENT: Negative.    Respiratory: Negative.  Negative for cough, chest tightness, shortness of breath and wheezing.   Cardiovascular: Negative.  Negative for chest pain and palpitations.  Gastrointestinal: Negative.   Musculoskeletal:  Positive for arthralgias, back pain and neck pain (improved some, tolerable with current medications. waiting to hear from orthopedic about cervical spine MRI.). Negative for neck stiffness.  Neurological:  Negative for weakness and headaches.  Psychiatric/Behavioral:  Negative.      Vital Signs: BP 138/75 Comment: 150/90  Pulse 72   Temp 98.4 F (36.9 C)   Resp 16   Ht 6\' 2"  (1.88 m)   Wt 214 lb 6.4 oz (97.3 kg)   SpO2 95%   BMI 27.53 kg/m    Physical Exam Vitals reviewed.  Constitutional:      General: He is not in acute distress.    Appearance: Normal appearance. He is not ill-appearing.  HENT:     Head: Normocephalic and atraumatic.  Eyes:     Pupils: Pupils are equal, round, and reactive to light.  Cardiovascular:     Rate and Rhythm: Normal rate and regular rhythm.  Pulmonary:     Effort: Pulmonary effort is normal. No respiratory distress.  Neurological:     Mental Status: He is alert and oriented to person, place, and time.  Psychiatric:        Mood and Affect: Mood normal.        Behavior: Behavior normal.        Assessment/Plan: 1. Type 2 diabetes mellitus with diabetic neuropathy, with long-term current use of insulin (HCC) A1c significantly improved. Continue mediations as prescribed, refills ordered. Repeat A1c in 4 months.  - POCT glycosylated hemoglobin (Hb A1C) - Semaglutide,0.25 or 0.5MG /DOS, (OZEMPIC, 0.25 OR 0.5 MG/DOSE,) 2 MG/3ML SOPN; INJECT 0.5 MG UNDER THE SKIN ONCE WEEKLY  Dispense: 3 mL; Refill: 5 - insulin glargine, 1 Unit Dial, (TOUJEO SOLOSTAR)  300 UNIT/ML Solostar Pen; Inject 40 units subcutaneously once daily. May increase to 45 units as indicated.  Dispense: 6 mL; Refill: 0  2. Primary hypertension Stable, continue olmesartan and amlodipine as prescribed.  - amLODipine (NORVASC) 10 MG tablet; Take 1 tablet (10 mg total) by mouth daily.  Dispense: 90 tablet; Refill: 0 - olmesartan (BENICAR) 20 MG tablet; Take 1 tablet (20 mg total) by mouth daily.  Dispense: 90 tablet; Refill: 3  3. Other male erectile dysfunction May continue cialis as needed as prescribed.  - tadalafil (CIALIS) 20 MG tablet; Take 1 tablet (20 mg total) by mouth daily as needed for erectile dysfunction.  Dispense: 10 tablet; Refill: 11  4. Encounter for medication review Medication list reviewed, updated and routine refills ordered  - gabapentin (NEURONTIN) 300 MG capsule; Take 1 tablet by mouth in the morning and 2 tablets by mouth in the evening every day.  Dispense: 270 capsule; Refill: 0 - atorvastatin (LIPITOR) 10 MG tablet; Take 1 tablet (10 mg total) by mouth at bedtime.  Dispense: 90 tablet; Refill: 3 - pantoprazole (PROTONIX) 40 MG tablet; Take 1 tablet (40 mg total) by mouth daily.  Dispense: 90 tablet; Refill: 3 - clopidogrel (PLAVIX) 75 MG tablet; Take 1 tablet (75 mg total) by mouth daily.  Dispense: 90 tablet; Refill: 1  5. Colon cancer screening Has been 10 years since last colonoscopy. He is due for routine CRC screening and this was done with Dr. Servando Snare last time, referral placed with Dr. Servando Snare again.  - Ambulatory referral to Gastroenterology  6. Needs flu shot Flu vaccine administered in office today.  - Influenza, MDCK, trivalent, PF(Flucelvax egg-free)   General Counseling: Darnelle verbalizes understanding of the findings of todays visit and agrees with plan of treatment. I have discussed any further diagnostic evaluation that may be needed or ordered today. We also reviewed his medications today. he has been encouraged to call the office with  any questions or concerns that should arise related to todays visit.  Orders Placed This Encounter  Procedures   Flu Vaccine MDCK QUAD PF   Ambulatory referral to Gastroenterology   POCT glycosylated hemoglobin (Hb A1C)    Meds ordered this encounter  Medications   Semaglutide,0.25 or 0.5MG /DOS, (OZEMPIC, 0.25 OR 0.5 MG/DOSE,) 2 MG/3ML SOPN    Sig: INJECT 0.5 MG UNDER THE SKIN ONCE WEEKLY    Dispense:  3 mL    Refill:  5   amLODipine (NORVASC) 10 MG tablet    Sig: Take 1 tablet (10 mg total) by mouth daily.    Dispense:  90 tablet    Refill:  0   insulin glargine, 1 Unit Dial, (TOUJEO SOLOSTAR) 300 UNIT/ML Solostar Pen    Sig: Inject 40 units subcutaneously once daily. May increase to 45 units as indicated.    Dispense:  6 mL    Refill:  0   gabapentin (NEURONTIN) 300 MG capsule    Sig: Take 1 tablet by mouth in the morning and 2 tablets by mouth in the evening every day.    Dispense:  270 capsule    Refill:  0   tadalafil (CIALIS) 20 MG tablet    Sig: Take 1 tablet (20 mg total) by mouth daily as needed for erectile dysfunction.    Dispense:  10 tablet    Refill:  11    Dose increased, discontinue 10 mg tablet.   olmesartan (BENICAR) 20 MG tablet    Sig: Take 1 tablet (20 mg total) by mouth daily.    Dispense:  90 tablet    Refill:  3    For future refills   atorvastatin (LIPITOR) 10 MG tablet    Sig: Take 1 tablet (10 mg total) by mouth at bedtime.    Dispense:  90 tablet    Refill:  3    For future refills   pantoprazole (PROTONIX) 40 MG tablet    Sig: Take 1 tablet (40 mg total) by mouth daily.    Dispense:  90 tablet    Refill:  3    patient will call for refills.   clopidogrel (PLAVIX) 75 MG tablet    Sig: Take 1 tablet (75 mg total) by mouth daily.    Dispense:  90 tablet    Refill:  1    For future refills    Return in about 16 weeks (around 07/16/2023) for please schedule medicare wellness visit for late december with Dekker Verga and recheck a1c with it.  .   Total time spent:30 Minutes Time spent includes review of chart, medications, test results, and follow up plan with the patient.   [place] Controlled Substance Database was reviewed by me.  This patient was seen by Sallyanne Kuster, FNP-C in collaboration with Dr. Beverely Risen as a part of collaborative care agreement.   Darrin Apodaca R. Tedd Sias, MSN, FNP-C Internal medicine

## 2023-03-27 ENCOUNTER — Telehealth: Payer: Self-pay

## 2023-03-27 NOTE — Progress Notes (Signed)
Carelink Summary Report / Loop Recorder 

## 2023-03-28 ENCOUNTER — Telehealth: Payer: Self-pay

## 2023-03-28 NOTE — Telephone Encounter (Signed)
Pt not read to schedule his colonoscopy due to having other appointments lined up in the next few months.  Informed him that we will send a reminder letter for him to call to schedule his colonoscopy and he can call us back to schedule early next year. He said that would be better for him.  Letter mailed.  Thanks,  [place], [place]

## 2023-03-30 ENCOUNTER — Telehealth: Payer: Self-pay

## 2023-03-30 ENCOUNTER — Other Ambulatory Visit: Payer: Self-pay | Admitting: Nurse Practitioner

## 2023-03-30 DIAGNOSIS — E1140 Type 2 diabetes mellitus with diabetic neuropathy, unspecified: Secondary | ICD-10-CM

## 2023-03-30 NOTE — Telephone Encounter (Signed)
Done

## 2023-03-30 NOTE — Telephone Encounter (Signed)
Faxed patient's recent progress notes to pharmacy.

## 2023-04-03 ENCOUNTER — Other Ambulatory Visit: Payer: Self-pay | Admitting: Nurse Practitioner

## 2023-04-03 DIAGNOSIS — E1140 Type 2 diabetes mellitus with diabetic neuropathy, unspecified: Secondary | ICD-10-CM

## 2023-04-17 LAB — CUP PACEART REMOTE DEVICE CHECK
Date Time Interrogation Session: 20240930231601
Implantable Pulse Generator Implant Date: 20231129

## 2023-04-23 ENCOUNTER — Ambulatory Visit (INDEPENDENT_AMBULATORY_CARE_PROVIDER_SITE_OTHER): Payer: BC Managed Care – PPO

## 2023-04-23 DIAGNOSIS — I639 Cerebral infarction, unspecified: Secondary | ICD-10-CM

## 2023-04-26 ENCOUNTER — Other Ambulatory Visit: Payer: Self-pay | Admitting: Nephrology

## 2023-04-26 DIAGNOSIS — N1832 Chronic kidney disease, stage 3b: Secondary | ICD-10-CM

## 2023-04-26 DIAGNOSIS — E1122 Type 2 diabetes mellitus with diabetic chronic kidney disease: Secondary | ICD-10-CM

## 2023-04-26 DIAGNOSIS — R8290 Unspecified abnormal findings in urine: Secondary | ICD-10-CM

## 2023-05-01 ENCOUNTER — Ambulatory Visit
Admission: RE | Admit: 2023-05-01 | Discharge: 2023-05-01 | Disposition: A | Discharge status: Home / Self Care | Payer: BC Managed Care – PPO | Source: Ambulatory Visit | Attending: Nephrology | Admitting: Nephrology

## 2023-05-01 DIAGNOSIS — N1832 Chronic kidney disease, stage 3b: Secondary | ICD-10-CM | POA: Diagnosis present

## 2023-05-01 DIAGNOSIS — E1122 Type 2 diabetes mellitus with diabetic chronic kidney disease: Secondary | ICD-10-CM | POA: Insufficient documentation

## 2023-05-01 DIAGNOSIS — R8290 Unspecified abnormal findings in urine: Secondary | ICD-10-CM | POA: Diagnosis present

## 2023-05-04 NOTE — Progress Notes (Signed)
Carelink Summary Report / Loop Recorder 

## 2023-05-09 ENCOUNTER — Telehealth: Payer: Self-pay

## 2023-05-09 NOTE — Telephone Encounter (Signed)
Left message for patient to give office a call.

## 2023-05-11 ENCOUNTER — Other Ambulatory Visit: Payer: Self-pay | Admitting: Nurse Practitioner

## 2023-05-11 DIAGNOSIS — E1140 Type 2 diabetes mellitus with diabetic neuropathy, unspecified: Secondary | ICD-10-CM

## 2023-05-22 LAB — CUP PACEART REMOTE DEVICE CHECK: Date Time Interrogation Session: 20241102230658

## 2023-05-28 ENCOUNTER — Ambulatory Visit (INDEPENDENT_AMBULATORY_CARE_PROVIDER_SITE_OTHER): Payer: BC Managed Care – PPO

## 2023-05-28 DIAGNOSIS — I639 Cerebral infarction, unspecified: Secondary | ICD-10-CM | POA: Diagnosis not present

## 2023-05-29 ENCOUNTER — Other Ambulatory Visit: Payer: Self-pay | Admitting: Internal Medicine

## 2023-05-29 NOTE — Telephone Encounter (Signed)
Hi,  Please outreach patient to schedule overdue 6 month follow up.  Thank you,   Ferne Coe

## 2023-06-01 NOTE — Telephone Encounter (Signed)
Last office visit 09/21/22 with plan to f/u in 6 months  next office visit:  06/07/23

## 2023-06-02 ENCOUNTER — Other Ambulatory Visit: Payer: Self-pay | Admitting: Internal Medicine

## 2023-06-07 ENCOUNTER — Encounter: Payer: Self-pay | Admitting: Medical

## 2023-06-07 ENCOUNTER — Ambulatory Visit: Payer: BC Managed Care – PPO | Attending: Medical | Admitting: Medical

## 2023-06-07 VITALS — BP 140/76 | HR 67 | Ht 74.0 in | Wt 217.0 lb

## 2023-06-07 DIAGNOSIS — E785 Hyperlipidemia, unspecified: Secondary | ICD-10-CM | POA: Diagnosis not present

## 2023-06-07 DIAGNOSIS — I10 Essential (primary) hypertension: Secondary | ICD-10-CM

## 2023-06-07 DIAGNOSIS — I639 Cerebral infarction, unspecified: Secondary | ICD-10-CM

## 2023-06-07 DIAGNOSIS — R600 Localized edema: Secondary | ICD-10-CM

## 2023-06-07 DIAGNOSIS — I2510 Atherosclerotic heart disease of native coronary artery without angina pectoris: Secondary | ICD-10-CM

## 2023-06-07 DIAGNOSIS — Z79899 Other long term (current) drug therapy: Secondary | ICD-10-CM

## 2023-06-07 NOTE — Patient Instructions (Signed)
Medication Instructions:  No changes at this time.   *If you need a refill on your cardiac medications before your next appointment, please call your pharmacy*   Lab Work: Lipid panel needed when you see your primary care provider.   If you have labs (blood work) drawn today and your tests are completely normal, you will receive your results only by: MyChart Message (if you have MyChart) OR A paper copy in the mail If you have any lab test that is abnormal or we need to change your treatment, we will call you to review the results.   Testing/Procedures: None   Follow-Up: At [facility], you and your health needs are our priority.  As part of our continuing mission to provide you with exceptional heart care, we have created designated Provider Care Teams.  These Care Teams include your primary Cardiologist (physician) and Advanced Practice Providers (APPs -  Physician Assistants and Nurse Practitioners) who all work together to provide you with the care you need, when you need it.    Your next appointment:   6 month(s)  Provider:   You may see Yvonne Kendall, MD or one of the following Advanced Practice Providers on your designated Care Team:   Nicolasa Ducking, NP Eula Listen, PA-C Cadence Fransico Michael, PA-C Charlsie Quest, NP Carlos Levering, NP

## 2023-06-07 NOTE — Progress Notes (Signed)
Cardiology Office Note:    Date:  06/07/2023   ID:  Gabriel Burton, DOB [birth year], MRN [id number]  PCP:  Sallyanne Kuster, NP  [facility] Cardiologist:  Yvonne Kendall, MD  [facility] Electrophysiologist:  Lanier Prude, MD   Referring MD: Sallyanne Kuster, NP   Chief Complaint: 6 month follow-up  History of Present Illness:    Gabriel Burton is a 72 y.o. male with a hx of cryptogenic stroke s/p ILR, HTN, HLD, DM2, chronic LBBB who presents for follow-up.   The patient was admitted in June 2023 with occipital cortical and right superior cerebellar infarct.  He reported residual visual disturbance after the stroke.  Follow-up 14-day heart monitor was negative.  Patient saw EP and he underwent ILR implant.  Loop recorder has not shown A-fib/flutter.  He was last seen 09/2022 reporting aching in his arms.  Statin was held with plan for assessment of symptoms at follow-up.  Today, the patient is overall doing well. He denies chest pain, SOB. He has chronic lower leg edema, he wears orthopaedic socks. He denies lightheadedness and dizziness. He has occasional acid reflux. BP is a little high today. At home, its 130/70s. He is still working at the [employer]. Diet is good. He denies any arm pain.  Past Medical History:  Diagnosis Date   Diabetes mellitus without complication (HCC)    Diastolic dysfunction    a. 09/2018 Echo: EF 63%, diast dysfxn. Mild conc LVH. Mild TR.   History of stress test    a. 09/2018 ETT: Ex time 3:50. BP 207/90. Inconclusive 2/2 LBBB.  Stopped 2/2 claudication; b. 12/2018 Lexi MV: fixed septal/apical defect - felt to be artifact 2/2 LBBB. Very mild cor Ca2+ and Ao atherosclerosis. Low-mod risk study. EF 43%.   Hyperlipidemia    Hypertension    a. 08/2020 Renal artery duplex: No evidence of RAS.   Left bundle branch block    Stroke [facility])     Past Surgical History:  Procedure Laterality Date   LOOP RECORDER IMPLANT  06/14/2022    Current  Medications: Current Meds  Medication Sig   amLODipine (NORVASC) 10 MG tablet Take 1 tablet (10 mg total) by mouth daily.   aspirin EC 81 MG tablet Take 81 mg by mouth daily.    atorvastatin (LIPITOR) 10 MG tablet Take 1 tablet (10 mg total) by mouth at bedtime.   carvedilol (COREG) 12.5 MG tablet TAKE 1 & 1/2 (ONE & ONE-HALF) TABLETS BY MOUTH TWICE DAILY WITH A MEAL   Cholecalciferol (VITAMIN D3) 5000 units TABS Take 5,000 Units by mouth daily.    CINNAMON PO Take by mouth daily.    clopidogrel (PLAVIX) 75 MG tablet Take 1 tablet (75 mg total) by mouth daily.   fluticasone (FLONASE) 50 MCG/ACT nasal spray Place 2 sprays into both nostrils daily. (Patient taking differently: Place 2 sprays into both nostrils as needed.)   gabapentin (NEURONTIN) 300 MG capsule Take 1 tablet by mouth in the morning and 2 tablets by mouth in the evening every day.   nitroGLYCERIN (NITROSTAT) 0.4 MG SL tablet DISSOLVE ONE TABLET UNDER THE TONGUE EVERY 5 MINUTES AS NEEDED FOR CHEST PAIN.  DO NOT EXCEED A TOTAL OF 3 DOSES IN 15 MINUTES   olmesartan (BENICAR) 20 MG tablet Take 1 tablet (20 mg total) by mouth daily.   pantoprazole (PROTONIX) 40 MG tablet Take 1 tablet (40 mg total) by mouth daily.   Semaglutide,0.25 or 0.5MG /DOS, (OZEMPIC, 0.25 OR 0.5 MG/DOSE,)  2 MG/3ML SOPN INJECT 0.5 MG UNDER THE SKIN ONCE WEEKLY   spironolactone (ALDACTONE) 25 MG tablet Take 1 tablet by mouth once daily   tadalafil (CIALIS) 20 MG tablet Take 1 tablet (20 mg total) by mouth daily as needed for erectile dysfunction.   TOUJEO SOLOSTAR 300 UNIT/ML Solostar Pen INJECT 40 UNITS SUBCUTANEOUSLY ONCE DAILY .MAY  INCREASE  TO  45  UNITS  AS  INDICATED   traMADol (ULTRAM) 50 MG tablet One tab po bid prn for pain   TRUEPLUS 5-BEVEL PEN NEEDLES 32G X 4 MM MISC USE  TWICE DAILY AS DIRECTED   vitamin C (ASCORBIC ACID) 500 MG tablet Take 500 mg by mouth daily.      Allergies:   Lisinopril   Social History   Socioeconomic History   Marital  status: Widowed    Spouse name: Not on file   Number of children: Not on file   Years of education: Not on file   Highest education level: Not on file  Occupational History   Not on file  Tobacco Use   Smoking status: Former    Current packs/day: 0.00    Average packs/day: 0.4 packs/day for 8.0 years (3.2 ttl pk-yrs)    Types: Cigarettes    Start date: 56    Quit date: 2000    Years since quitting: 24.9   Smokeless tobacco: Never  Vaping Use   Vaping status: Never Used  Substance and Sexual Activity   Alcohol use: Never   Drug use: Never   Sexual activity: Not on file  Other Topics Concern   Not on file  Social History Narrative   Not on file   Social Determinants of Health   Financial Resource Strain: Not on file  Food Insecurity: Not on file  Transportation Needs: Not on file  Physical Activity: Not on file  Stress: Not on file  Social Connections: Not on file     Family History: The patient's family history includes Heart attack (age of onset: 41) in his brother; Hypertension in his father and mother.  ROS:   Please see the history of present illness.     All other systems reviewed and are negative.  EKGs/Labs/Other Studies Reviewed:    The following studies were reviewed today:  Echo 12/2021 1. Left ventricular ejection fraction, by estimation, is 50 %. The left  ventricle has low normal function. The left ventricle demonstrates  regional wall motion abnormalities (septal wall motion abnormality noted  in the setting of bundle branch block).  Left ventricular diastolic parameters are consistent with Grade I  diastolic dysfunction (impaired relaxation).   2. Right ventricular systolic function is normal. The right ventricular  size is normal.   3. Left atrial size was mildly dilated.   4. The mitral valve is normal in structure. No evidence of mitral valve  regurgitation. No evidence of mitral stenosis.   5. The aortic valve is tricuspid. Aortic valve  regurgitation is not  visualized. No aortic stenosis is present.   6. The inferior vena cava is normal in size with greater than 50%  respiratory variability, suggesting right atrial pressure of 3 mmHg.   EKG:  EKG is ordered today.  The ekg ordered today demonstrates NSR, 1st degree AV block, LAD, TWI aVL  Recent Labs: 10/02/2022: BUN 43; Creatinine, Ser 1.89; Potassium 4.6; Sodium 141  Recent Lipid Panel    Component Value Date/Time   CHOL 128 01/05/2022 0525   CHOL 150 07/25/2021 1509  TRIG 63 01/05/2022 0525   HDL 51 01/05/2022 0525   HDL 57 07/25/2021 1509   CHOLHDL 2.5 01/05/2022 0525   VLDL 13 01/05/2022 0525   LDLCALC 64 01/05/2022 0525   LDLCALC 75 07/25/2021 1509    Physical Exam:    VS:  BP (!) 140/76 (BP Location: Left Arm, Patient Position: Sitting, Cuff Size: Normal)   Pulse 67   Ht 6\' 2"  (1.88 m)   Wt 217 lb (98.4 kg)   SpO2 97%   BMI 27.86 kg/m     Wt Readings from Last 3 Encounters:  06/07/23 217 lb (98.4 kg)  03/26/23 214 lb 6.4 oz (97.3 kg)  11/20/22 213 lb (96.6 kg)     GEN:  Well nourished, well developed in no acute distress HEENT: Normal NECK: No JVD; No carotid bruits LYMPHATICS: No lymphadenopathy CARDIAC: RRR, no murmurs, rubs, gallops RESPIRATORY:  Clear to auscultation without rales, wheezing or rhonchi  ABDOMEN: Soft, non-tender, non-distended MUSCULOSKELETAL:  mild left lower leg edema; No deformity  SKIN: Warm and dry NEUROLOGIC:  Alert and oriented x 3 PSYCHIATRIC:  Normal affect   ASSESSMENT:    1. Cryptogenic stroke (HCC)   2. Hyperlipidemia LDL goal <70   3. Coronary artery calcification   4. Essential hypertension   5. Medication management   6. Lower leg edema    PLAN:    In order of problems listed above:  Cryptogenic stroke No residual deficits. ILR has so far been unremarkable. Statin held for arm pain. Patient denies any further arm pain. Continue Aspirin, Plavix and Lipitor.  HTN BP is borderline today. He  says BP is good at home. Continue amlodipine 10mg  daily, Coreg 18.75mg BID, olmesartan 20mg  daily, spironolactone 25mg  daily.  Lower leg edema He reports chronic left sided edema. He has mild edema on exam. He takes spironolactone 25mg  daily. Prior echo showed LVEF 50%, G1DD. Lifestyle changes discussed. We will revisit this at follow-up.   HLD LDL 64. PCP will re-check labs. Lipitor stopped at the last visit for arm pain., but he is back re-taking it.   Disposition: Follow up in 6 month(s) with MD/APP    Signed, Cruz Devilla David Stall, PA-C  06/07/2023 4:24 PM    [facility]

## 2023-06-08 ENCOUNTER — Telehealth: Payer: Self-pay | Admitting: *Deleted

## 2023-06-08 NOTE — Telephone Encounter (Signed)
Spoke with patient and he reviewed all cardiac medications. He is in fact taking the lipitor (atorvastatin). Advised that I would update provider and he was appreciative for the call with no further questions.

## 2023-06-08 NOTE — Telephone Encounter (Signed)
Provider requested that I please call patient to see if he is still taking Lipitor.

## 2023-06-20 NOTE — Progress Notes (Signed)
Carelink Summary Report / Loop Recorder 

## 2023-06-22 ENCOUNTER — Ambulatory Visit (INDEPENDENT_AMBULATORY_CARE_PROVIDER_SITE_OTHER): Payer: BC Managed Care – PPO

## 2023-06-22 DIAGNOSIS — I639 Cerebral infarction, unspecified: Secondary | ICD-10-CM

## 2023-06-22 LAB — CUP PACEART REMOTE DEVICE CHECK: Date Time Interrogation Session: 20241205231152

## 2023-07-09 ENCOUNTER — Other Ambulatory Visit: Payer: Self-pay | Admitting: Internal Medicine

## 2023-07-13 ENCOUNTER — Ambulatory Visit (INDEPENDENT_AMBULATORY_CARE_PROVIDER_SITE_OTHER): Payer: BC Managed Care – PPO | Admitting: Nurse Practitioner

## 2023-07-13 ENCOUNTER — Encounter: Payer: Self-pay | Admitting: Nurse Practitioner

## 2023-07-13 VITALS — BP 125/75 | HR 80 | Temp 98.3°F | Resp 16 | Ht 74.0 in | Wt 216.2 lb

## 2023-07-13 DIAGNOSIS — Z1211 Encounter for screening for malignant neoplasm of colon: Secondary | ICD-10-CM

## 2023-07-13 DIAGNOSIS — Z794 Long term (current) use of insulin: Secondary | ICD-10-CM

## 2023-07-13 DIAGNOSIS — N1831 Chronic kidney disease, stage 3a: Secondary | ICD-10-CM | POA: Diagnosis not present

## 2023-07-13 DIAGNOSIS — Z0001 Encounter for general adult medical examination with abnormal findings: Secondary | ICD-10-CM | POA: Diagnosis not present

## 2023-07-13 DIAGNOSIS — I152 Hypertension secondary to endocrine disorders: Secondary | ICD-10-CM

## 2023-07-13 DIAGNOSIS — J0100 Acute maxillary sinusitis, unspecified: Secondary | ICD-10-CM | POA: Diagnosis not present

## 2023-07-13 DIAGNOSIS — E1122 Type 2 diabetes mellitus with diabetic chronic kidney disease: Secondary | ICD-10-CM

## 2023-07-13 DIAGNOSIS — Z1212 Encounter for screening for malignant neoplasm of rectum: Secondary | ICD-10-CM

## 2023-07-13 DIAGNOSIS — E1159 Type 2 diabetes mellitus with other circulatory complications: Secondary | ICD-10-CM

## 2023-07-13 MED ORDER — AMLODIPINE BESYLATE 10 MG PO TABS: 10.0000 mg | ORAL_TABLET | Freq: Every day | ORAL | 3 refills | Status: DC

## 2023-07-13 MED ORDER — AZITHROMYCIN 250 MG PO TABS: ORAL_TABLET | ORAL | 0 refills | Status: AC

## 2023-07-13 NOTE — Progress Notes (Cosign Needed)
[facility] [address] [place], [place] [address]  Internal MEDICINE  Office Visit Note  Patient Name: Gabriel Burton  [id number]  [id number]  Date of Service: 07/13/2023  Chief Complaint  Patient presents with   Diabetes   Hyperlipidemia   Medicare Wellness    HPI [name] presents for an annual well visit and physical exam.  Well-appearing 72 y.o. male with diabetes, hypertension, CKD stage 3a, allergic rhinitis, hyperlipidemia, and history of stroke.  Routine CRC screening: overdue now, needs GI referral  Eye exam: due in February 2025 foot exam: done  Labs: due for routine labs now.  New or worsening pain: none  Other concerns: having symptoms of sinus infection since last week.  Cardiologist: Dr. Cristal Deer End and Cadence Fransico Michael PA-C Nephrologist: Dr. Lorain Childes Goes to [facility] for diabetic eye exams.        07/13/2023    8:54 AM 07/13/2022   11:13 AM 07/08/2021    9:18 AM  MMSE - Mini Mental State Exam  Orientation to time 5 5 5   Orientation to Place 5 5 5   Registration 3 3 3   Attention/ Calculation 5 5 5   Recall 3 3 3   Language- name 2 objects 2 2 2   Language- repeat 1 1 1   Language- follow 3 step command 3 3 3   Language- read & follow direction 1 1 1   Write a sentence 1 1 1   Copy design 1 1 1   Total score 30 30 30     Functional Status Survey: Is the patient deaf or have difficulty hearing?: No Does the patient have difficulty seeing, even when wearing glasses/contacts?: No Does the patient have difficulty concentrating, remembering, or making decisions?: No Does the patient have difficulty walking or climbing stairs?: No Does the patient have difficulty dressing or bathing?: No Does the patient have difficulty doing errands alone such as visiting a doctor's office or shopping?: No     01/25/2022    2:10 PM 04/27/2022    1:39 PM 07/13/2022   11:13 AM 03/26/2023    9:51 AM 07/13/2023    8:53 AM  Fall Risk  Falls in  the past year? 0 0 0 0 0  Was there an injury with Fall?  0  0 0  Fall Risk Category Calculator  0  0 0  Fall Risk Category (Retired)  Low     (RETIRED) Patient Fall Risk Level Low fall risk Low fall risk     Patient at Risk for Falls Due to No Fall Risks No Fall Risks  No Fall Risks No Fall Risks  Fall risk Follow up Falls evaluation completed Falls evaluation completed  Falls evaluation completed Falls evaluation completed       07/13/2023    8:53 AM  Depression screen PHQ 2/9  Decreased Interest 0  Down, Depressed, Hopeless 0  PHQ - 2 Score 0        Current Medication: Outpatient Encounter Medications as of 07/13/2023  Medication Sig   aspirin EC 81 MG tablet Take 81 mg by mouth daily.    atorvastatin (LIPITOR) 10 MG tablet Take 1 tablet (10 mg total) by mouth at bedtime.   azithromycin (ZITHROMAX) 250 MG tablet Take 2 tablets on day 1, then 1 tablet daily on days 2 through 5   carvedilol (COREG) 12.5 MG tablet TAKE 1 & 1/2 (ONE & ONE-HALF) TABLETS BY MOUTH TWICE DAILY WITH MEALS   Cholecalciferol (VITAMIN D3) 5000 units TABS Take 5,000 Units  by mouth daily.    CINNAMON PO Take by mouth daily.    clopidogrel (PLAVIX) 75 MG tablet Take 1 tablet (75 mg total) by mouth daily.   cyclobenzaprine (FLEXERIL) 5 MG tablet Take 1 tablet (5 mg total) by mouth at bedtime as needed for muscle spasms.   fluticasone (FLONASE) 50 MCG/ACT nasal spray Place 2 sprays into both nostrils daily. (Patient taking differently: Place 2 sprays into both nostrils as needed.)   gabapentin (NEURONTIN) 300 MG capsule Take 1 tablet by mouth in the morning and 2 tablets by mouth in the evening every day.   nitroGLYCERIN (NITROSTAT) 0.4 MG SL tablet DISSOLVE ONE TABLET UNDER THE TONGUE EVERY 5 MINUTES AS NEEDED FOR CHEST PAIN.  DO NOT EXCEED A TOTAL OF 3 DOSES IN 15 MINUTES   olmesartan (BENICAR) 20 MG tablet Take 1 tablet (20 mg total) by mouth daily.   pantoprazole (PROTONIX) 40 MG tablet Take 1 tablet (40  mg total) by mouth daily.   Semaglutide,0.25 or 0.5MG /DOS, (OZEMPIC, 0.25 OR 0.5 MG/DOSE,) 2 MG/3ML SOPN INJECT 0.5 MG UNDER THE SKIN ONCE WEEKLY   spironolactone (ALDACTONE) 25 MG tablet Take 1 tablet by mouth once daily   tadalafil (CIALIS) 20 MG tablet Take 1 tablet (20 mg total) by mouth daily as needed for erectile dysfunction.   TOUJEO SOLOSTAR 300 UNIT/ML Solostar Pen INJECT 40 UNITS SUBCUTANEOUSLY ONCE DAILY .MAY  INCREASE  TO  45  UNITS  AS  INDICATED   traMADol (ULTRAM) 50 MG tablet One tab po bid prn for pain   TRUEPLUS 5-BEVEL PEN NEEDLES 32G X 4 MM MISC USE  TWICE DAILY AS DIRECTED   vitamin C (ASCORBIC ACID) 500 MG tablet Take 500 mg by mouth daily.    [DISCONTINUED] amLODipine (NORVASC) 10 MG tablet Take 1 tablet (10 mg total) by mouth daily.   amLODipine (NORVASC) 10 MG tablet Take 1 tablet (10 mg total) by mouth daily.   No facility-administered encounter medications on file as of 07/13/2023.    Surgical History: Past Surgical History:  Procedure Laterality Date   LOOP RECORDER IMPLANT  06/14/2022    Medical History: Past Medical History:  Diagnosis Date   Diabetes mellitus without complication (HCC)    Diastolic dysfunction    a. 09/2018 Echo: EF 63%, diast dysfxn. Mild conc LVH. Mild TR.   History of stress test    a. 09/2018 ETT: Ex time 3:50. BP 207/90. Inconclusive 2/2 LBBB.  Stopped 2/2 claudication; b. 12/2018 Lexi MV: fixed septal/apical defect - felt to be artifact 2/2 LBBB. Very mild cor Ca2+ and Ao atherosclerosis. Low-mod risk study. EF 43%.   Hyperlipidemia    Hypertension    a. 08/2020 Renal artery duplex: No evidence of RAS.   Left bundle branch block    Stroke [facility])     Family History: Family History  Problem Relation Age of Onset   Hypertension Mother    Hypertension Father    Heart attack Brother 32    Social History   Socioeconomic History   Marital status: Widowed    Spouse name: Not on file   Number of children: Not on file   Years of  education: Not on file   Highest education level: Not on file  Occupational History   Not on file  Tobacco Use   Smoking status: Former    Current packs/day: 0.00    Average packs/day: 0.4 packs/day for 8.0 years (3.2 ttl pk-yrs)    Types: Cigarettes  Start date: 50    Quit date: 2000    Years since quitting: 25.0   Smokeless tobacco: Never  Vaping Use   Vaping status: Never Used  Substance and Sexual Activity   Alcohol use: Never   Drug use: Never   Sexual activity: Not on file  Other Topics Concern   Not on file  Social History Narrative   Not on file   Social Drivers of Health   Financial Resource Strain: Not on file  Food Insecurity: Not on file  Transportation Needs: Not on file  Physical Activity: Not on file  Stress: Not on file  Social Connections: Not on file  Intimate Partner Violence: Not on file      Review of Systems  Constitutional:  Negative for activity change, appetite change, chills, fatigue, fever and unexpected weight change.  HENT: Negative.  Negative for congestion, ear pain, rhinorrhea, sore throat and trouble swallowing.   Eyes: Negative.   Respiratory: Negative.  Negative for cough, chest tightness, shortness of breath and wheezing.   Cardiovascular: Negative.  Negative for chest pain and palpitations.  Gastrointestinal:  Negative for abdominal distention, abdominal pain, blood in stool, constipation, diarrhea, nausea and vomiting.  Endocrine: Negative.   Genitourinary: Negative.  Negative for difficulty urinating, dysuria, frequency, hematuria and urgency.  Musculoskeletal: Negative.  Negative for arthralgias, back pain, joint swelling, myalgias and neck pain.  Skin: Negative.  Negative for rash and wound.  Allergic/Immunologic: Negative.  Negative for immunocompromised state.  Neurological: Negative.  Negative for dizziness, seizures, numbness and headaches.  Hematological: Negative.   Psychiatric/Behavioral: Negative.  Negative for  behavioral problems, self-injury and suicidal ideas. The patient is not nervous/anxious.     Vital Signs: BP 125/75 Comment: 148/90  Pulse 80   Temp 98.3 F (36.8 C)   Resp 16   Ht 6\' 2"  (1.88 m)   Wt 216 lb 3.2 oz (98.1 kg)   SpO2 97%   BMI 27.76 kg/m    Physical Exam Vitals reviewed.  Constitutional:      General: He is awake. He is not in acute distress.    Appearance: Normal appearance. He is well-developed, well-groomed and overweight. He is not ill-appearing or diaphoretic.  HENT:     Head: Normocephalic and atraumatic.     Right Ear: Tympanic membrane, ear canal and external ear normal.     Left Ear: Tympanic membrane, ear canal and external ear normal.     Nose: Nose normal. No congestion or rhinorrhea.     Mouth/Throat:     Lips: Pink.     Mouth: Mucous membranes are moist.     Pharynx: Oropharynx is clear. Uvula midline. No oropharyngeal exudate or posterior oropharyngeal erythema.  Eyes:     General: Lids are normal. Vision grossly intact. Gaze aligned appropriately. No scleral icterus.       Right eye: No discharge.        Left eye: No discharge.     Extraocular Movements: Extraocular movements intact.     Conjunctiva/sclera: Conjunctivae normal.     Pupils: Pupils are equal, round, and reactive to light.     Funduscopic exam:    Right eye: Red reflex present.        Left eye: Red reflex present. Neck:     Thyroid: No thyromegaly.     Vascular: No JVD.     Trachea: Trachea and phonation normal. No tracheal deviation.  Cardiovascular:     Rate and Rhythm: Normal rate and regular  rhythm.     Pulses: Normal pulses.     Heart sounds: Normal heart sounds, S1 normal and S2 normal. No murmur heard.    No friction rub. No gallop.  Pulmonary:     Effort: Pulmonary effort is normal. No accessory muscle usage or respiratory distress.     Breath sounds: Normal breath sounds and air entry. No stridor. No wheezing or rales.  Chest:     Chest wall: No tenderness.   Abdominal:     General: Bowel sounds are normal. There is no distension.     Palpations: Abdomen is soft. There is no shifting dullness, fluid wave, mass or pulsatile mass.     Tenderness: There is no abdominal tenderness. There is no guarding or rebound.  Musculoskeletal:        General: No tenderness or deformity. Normal range of motion.     Cervical back: Normal range of motion and neck supple.     Right lower leg: No edema.     Left lower leg: No edema.  Feet:     Right foot:     Protective Sensation: 6 sites tested.  6 sites sensed.     Skin integrity: No skin breakdown, erythema, warmth, callus or dry skin.     Toenail Condition: Right toenails are abnormally thick and long. Fungal disease present.    Left foot:     Protective Sensation: 6 sites tested.  6 sites sensed.     Skin integrity: No erythema, warmth, callus or dry skin.     Toenail Condition: Left toenails are abnormally thick and long. Fungal disease present. Lymphadenopathy:     Cervical: No cervical adenopathy.  Skin:    General: Skin is warm and dry.     Capillary Refill: Capillary refill takes less than 2 seconds.     Coloration: Skin is not pale.     Findings: No erythema or rash.  Neurological:     Mental Status: He is alert and oriented to person, place, and time.     Cranial Nerves: No cranial nerve deficit.     Motor: No abnormal muscle tone.     Coordination: Coordination normal.     Gait: Gait normal.     Deep Tendon Reflexes: Reflexes are normal and symmetric.  Psychiatric:        Mood and Affect: Mood normal.        Behavior: Behavior normal. Behavior is cooperative.        Thought Content: Thought content normal.        Judgment: Judgment normal.        Assessment/Plan: 1. Encounter for routine adult health examination with abnormal findings (Primary) Age-appropriate preventive screenings and vaccinations discussed, annual physical exam completed. Routine labs for health maintenance will  be ordered. PHM updated.    2. Type 2 diabetes mellitus with stage 3a chronic kidney disease, with long-term current use of insulin (HCC) Last A1c is stable, will repeat with routine labs. Urine sent for microalbumin/creatinine ratio. Continue ozempic and toujeo as prescribed.  - Urine Microalbumin w/creat. ratio  3. Hypertension associated with diabetes (HCC) Stable, continue amlodipine as prescribed, refills ordered  - amLODipine (NORVASC) 10 MG tablet; Take 1 tablet (10 mg total) by mouth daily.  Dispense: 90 tablet; Refill: 3  4. Stage 3a chronic kidney disease (HCC) Followed by nephrology  5. Acute non-recurrent maxillary sinusitis Zpak prescribed, take until gone  - azithromycin (ZITHROMAX) 250 MG tablet; Take 2 tablets on day 1, then  1 tablet daily on days 2 through 5  Dispense: 6 tablet; Refill: 0  6. Screening for colorectal cancer Referred to GI for routine colonoscopy - Ambulatory referral to Gastroenterology      General Counseling: Rolondo verbalizes understanding of the findings of todays visit and agrees with plan of treatment. I have discussed any further diagnostic evaluation that may be needed or ordered today. We also reviewed his medications today. he has been encouraged to call the office with any questions or concerns that should arise related to todays visit.    Orders Placed This Encounter  Procedures   Urine Microalbumin w/creat. ratio   Ambulatory referral to Gastroenterology    Meds ordered this encounter  Medications   azithromycin (ZITHROMAX) 250 MG tablet    Sig: Take 2 tablets on day 1, then 1 tablet daily on days 2 through 5    Dispense:  6 tablet    Refill:  0   amLODipine (NORVASC) 10 MG tablet    Sig: Take 1 tablet (10 mg total) by mouth daily.    Dispense:  90 tablet    Refill:  3    Return in about 4 months (around 11/11/2023) for F/U, Recheck A1C, Kaelon Weekes PCP.   Total time spent:30 Minutes Time spent includes review of chart,  medications, test results, and follow up plan with the patient.    Controlled Substance Database was reviewed by me.  This patient was seen by Sallyanne Kuster, FNP-C in collaboration with Dr. Beverely Risen as a part of collaborative care agreement.  Telsa Dillavou R. Tedd Sias, MSN, FNP-C Internal medicine

## 2023-07-21 ENCOUNTER — Other Ambulatory Visit: Payer: Self-pay | Admitting: Nurse Practitioner

## 2023-07-21 DIAGNOSIS — I10 Essential (primary) hypertension: Secondary | ICD-10-CM

## 2023-07-27 ENCOUNTER — Ambulatory Visit: Payer: BC Managed Care – PPO

## 2023-07-27 DIAGNOSIS — I639 Cerebral infarction, unspecified: Secondary | ICD-10-CM | POA: Diagnosis not present

## 2023-07-27 LAB — CUP PACEART REMOTE DEVICE CHECK: Date Time Interrogation Session: 20250109231438

## 2023-07-30 ENCOUNTER — Other Ambulatory Visit: Payer: Self-pay | Admitting: Nurse Practitioner

## 2023-07-30 DIAGNOSIS — E1140 Type 2 diabetes mellitus with diabetic neuropathy, unspecified: Secondary | ICD-10-CM

## 2023-07-31 ENCOUNTER — Encounter: Payer: Self-pay | Admitting: *Deleted

## 2023-08-03 ENCOUNTER — Other Ambulatory Visit: Payer: Self-pay | Admitting: Nurse Practitioner

## 2023-08-03 DIAGNOSIS — Z794 Long term (current) use of insulin: Secondary | ICD-10-CM

## 2023-08-20 ENCOUNTER — Encounter: Payer: Self-pay | Admitting: *Deleted

## 2023-08-29 ENCOUNTER — Encounter: Payer: Self-pay | Admitting: Urology

## 2023-08-29 ENCOUNTER — Ambulatory Visit: Payer: BC Managed Care – PPO | Admitting: Urology

## 2023-08-29 ENCOUNTER — Other Ambulatory Visit: Payer: Self-pay | Admitting: Nurse Practitioner

## 2023-08-29 VITALS — BP 176/94 | HR 77 | Ht 74.0 in | Wt 218.4 lb

## 2023-08-29 DIAGNOSIS — N2889 Other specified disorders of kidney and ureter: Secondary | ICD-10-CM

## 2023-08-29 LAB — URINALYSIS, COMPLETE
Bilirubin, UA: NEGATIVE
Glucose, UA: NEGATIVE
Ketones, UA: NEGATIVE
Nitrite, UA: NEGATIVE
Protein,UA: NEGATIVE
RBC, UA: NEGATIVE
Specific Gravity, UA: 1.015 (ref 1.005–1.030)
Urobilinogen, Ur: 0.2 mg/dL (ref 0.2–1.0)
pH, UA: 7 (ref 5.0–7.5)

## 2023-08-29 LAB — MICROSCOPIC EXAMINATION

## 2023-08-29 NOTE — Progress Notes (Signed)
 Marcelle Overlie Plume,acting as a scribe for Gabriel Scotland, MD.,have documented all relevant documentation on the behalf of Gabriel Scotland, MD,as directed by  Gabriel Scotland, MD while in the presence of Gabriel Scotland, MD.  08/29/23 12:10 PM   Gabriel Burton 12/19/1950 [id number]  Referring provider: Lorain Childes, MD [address] [place],  [place] [address]  Chief Complaint  Patient presents with   left renal mass    HPI: 73 year-old male who is referred for incidental renal lesion.   He underwent a renal ultrasound for further evaluation of stage 3 CKD. This showed an incidental 2.7 by 2.7 anechoic mass on the posterior aspect of the left kidney, which was interpreted as a minimally complicated cyst.   The radiologist had recommended follow up in 6 months to assess for stability. This ultrasound was performed in November. I personally reviewed the study, and it has the appearance of a cyst, suspected Bosniak 2.    PMH: Past Medical History:  Diagnosis Date   Diabetes mellitus without complication (HCC)    Diastolic dysfunction    a. 09/2018 Echo: EF 63%, diast dysfxn. Mild conc LVH. Mild TR.   History of stress test    a. 09/2018 ETT: Ex time 3:50. BP 207/90. Inconclusive 2/2 LBBB.  Stopped 2/2 claudication; b. 12/2018 Lexi MV: fixed septal/apical defect - felt to be artifact 2/2 LBBB. Very mild cor Ca2+ and Ao atherosclerosis. Low-mod risk study. EF 43%.   Hyperlipidemia    Hypertension    a. 08/2020 Renal artery duplex: No evidence of RAS.   Left bundle branch block    Stroke [facility])     Surgical History: Past Surgical History:  Procedure Laterality Date   LOOP RECORDER IMPLANT  06/14/2022    Home Medications:  Allergies as of 08/29/2023       Reactions   Lisinopril Swelling        Medication List        Accurate as of August 29, 2023 12:10 PM. If you have any questions, ask your nurse or doctor.          amLODipine 10 MG  tablet Commonly known as: NORVASC Take 1 tablet (10 mg total) by mouth daily.   ascorbic acid 500 MG tablet Commonly known as: VITAMIN C Take 500 mg by mouth daily.   aspirin EC 81 MG tablet Take 81 mg by mouth daily.   atorvastatin 10 MG tablet Commonly known as: LIPITOR Take 1 tablet (10 mg total) by mouth at bedtime.   carvedilol 12.5 MG tablet Commonly known as: COREG TAKE 1 & 1/2 (ONE & ONE-HALF) TABLETS BY MOUTH TWICE DAILY WITH MEALS   CINNAMON PO Take by mouth daily.   clopidogrel 75 MG tablet Commonly known as: PLAVIX Take 1 tablet (75 mg total) by mouth daily.   cyclobenzaprine 5 MG tablet Commonly known as: FLEXERIL Take 1 tablet (5 mg total) by mouth at bedtime as needed for muscle spasms.   fluticasone 50 MCG/ACT nasal spray Commonly known as: FLONASE Place 2 sprays into both nostrils daily. What changed:  when to take this reasons to take this   gabapentin 300 MG capsule Commonly known as: NEURONTIN Take 1 tablet by mouth in the morning and 2 tablets by mouth in the evening every day.   nitroGLYCERIN 0.4 MG SL tablet Commonly known as: NITROSTAT DISSOLVE ONE TABLET UNDER THE TONGUE EVERY 5 MINUTES AS NEEDED FOR CHEST PAIN.  DO NOT EXCEED A TOTAL  OF 3 DOSES IN 15 MINUTES   olmesartan 20 MG tablet Commonly known as: BENICAR Take 1 tablet by mouth once daily   Ozempic (0.25 or 0.5 MG/DOSE) 2 MG/3ML Sopn Generic drug: Semaglutide(0.25 or 0.5MG /DOS) INJECT 0.5 MG UNDER THE SKIN ONCE WEEKLY   pantoprazole 40 MG tablet Commonly known as: PROTONIX Take 1 tablet (40 mg total) by mouth daily.   spironolactone 25 MG tablet Commonly known as: ALDACTONE Take 1 tablet by mouth once daily   tadalafil 20 MG tablet Commonly known as: CIALIS Take 1 tablet (20 mg total) by mouth daily as needed for erectile dysfunction.   Toujeo SoloStar 300 UNIT/ML Solostar Pen Generic drug: insulin glargine (1 Unit Dial) INJECT 40 UNITS UNDER THE SKIN ONCE DAILY. MAY  INCREASE TO 45 UNITS AS INDICATED   traMADol 50 MG tablet Commonly known as: ULTRAM One tab po bid prn for pain   TRUEplus 5-Bevel Pen Needles 32G X 4 MM Misc Generic drug: Insulin Pen Needle USE  TWICE DAILY AS DIRECTED   Vitamin D3 125 MCG (5000 UT) Tabs Take 5,000 Units by mouth daily.        Allergies:  Allergies  Allergen Reactions   Lisinopril Swelling    Family History: Family History  Problem Relation Age of Onset   Hypertension Mother    Hypertension Father    Heart attack Brother 5    Social History:  reports that he quit smoking about 25 years ago. His smoking use included cigarettes. He started smoking about 33 years ago. He has a 3.2 pack-year smoking history. He has never used smokeless tobacco. He reports that he does not drink alcohol and does not use drugs.   Physical Exam: BP (!) 176/94   Pulse 77   Ht 6\' 2"  (1.88 m)   Wt 218 lb 6.4 oz (99.1 kg)   BMI 28.04 kg/m   Constitutional:  Alert and oriented, No acute distress. HEENT: [place] AT, moist mucus membranes.  Trachea midline, no masses. Neurologic: Grossly intact, no focal deficits, moving all 4 extremities. Psychiatric: Normal mood and affect.   Pertinent Imaging: EXAM: RENAL / URINARY TRACT ULTRASOUND COMPLETE  COMPARISON:  July Twenty-third 2010  FINDINGS: Right Kidney:  Renal measurements: 11.8 x 4.5 x 3.5 cm = volume: 96 mL. Echogenicity within normal limits. No mass or hydronephrosis visualized.  Left Kidney:  Renal measurements: 10.8 x 5.3 x 4.6 cm = volume: 135 mL. Echogenicity within normal limits. No hydronephrosis visualized. In the lateral kidney, there is a 2.7 x 2.7 x 2.7 cm oval near anechoic mass with suggestion of posterior acoustic enhancement.  Bladder:  Appears normal for degree of bladder distention.  Other:  None.  IMPRESSION: 1. No hydronephrosis. 2. There is a 2.7 cm left renal mass which is favored to reflect a mildly complicated cyst. Recommend  follow-up ultrasound in 6 months to assess for stability.   Electronically Signed By: Meda Klinefelter M.D. On: 05/22/2023 16:41  Renal ultrasound personally reviewed   Assessment & Plan:    1. 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 - For cystic renal masses, we reviewed the Bosniak classification and discussed that Bosniak 3 lesions harbor a 50% chance of malignancy whereas Bosniak 4 cysts have a solid and 90-95% are malignant in nature.    Return in about 9 months (around 05/28/2024) for follow  up with renal ultrasound prior.  I have reviewed the above documentation for accuracy and completeness, and I agree with the above.   Gabriel Scotland, MD   [facility] [address] [place], [place] [address] [phone]

## 2023-08-30 LAB — HGB A1C W/O EAG: Hgb A1c MFr Bld: 7.2 % — ABNORMAL HIGH (ref 4.8–5.6)

## 2023-08-30 LAB — CBC WITH DIFFERENTIAL/PLATELET
Basophils Absolute: 0 10*3/uL (ref 0.0–0.2)
Basos: 1 %
EOS (ABSOLUTE): 0.3 10*3/uL (ref 0.0–0.4)
Eos: 5 %
Hematocrit: 41.5 % (ref 37.5–51.0)
Hemoglobin: 13.9 g/dL (ref 13.0–17.7)
Immature Grans (Abs): 0 10*3/uL (ref 0.0–0.1)
Immature Granulocytes: 0 %
Lymphocytes Absolute: 2.3 10*3/uL (ref 0.7–3.1)
Lymphs: 41 %
MCH: 28.7 pg (ref 26.6–33.0)
MCHC: 33.5 g/dL (ref 31.5–35.7)
MCV: 86 fL (ref 79–97)
Monocytes Absolute: 0.6 10*3/uL (ref 0.1–0.9)
Monocytes: 11 %
Neutrophils Absolute: 2.3 10*3/uL (ref 1.4–7.0)
Neutrophils: 42 %
Platelets: 214 10*3/uL (ref 150–450)
RBC: 4.84 x10E6/uL (ref 4.14–5.80)
RDW: 12.3 % (ref 11.6–15.4)
WBC: 5.5 10*3/uL (ref 3.4–10.8)

## 2023-08-30 LAB — COMPREHENSIVE METABOLIC PANEL
ALT: 36 [IU]/L (ref 0–44)
AST: 32 [IU]/L (ref 0–40)
Albumin: 4.5 g/dL (ref 3.8–4.8)
Alkaline Phosphatase: 121 [IU]/L (ref 44–121)
BUN/Creatinine Ratio: 11 (ref 10–24)
BUN: 17 mg/dL (ref 8–27)
Bilirubin Total: 0.3 mg/dL (ref 0.0–1.2)
CO2: 26 mmol/L (ref 20–29)
Calcium: 10.1 mg/dL (ref 8.6–10.2)
Chloride: 102 mmol/L (ref 96–106)
Creatinine, Ser: 1.54 mg/dL — ABNORMAL HIGH (ref 0.76–1.27)
Globulin, Total: 2.7 g/dL (ref 1.5–4.5)
Glucose: 66 mg/dL — ABNORMAL LOW (ref 70–99)
Potassium: 4.5 mmol/L (ref 3.5–5.2)
Sodium: 143 mmol/L (ref 134–144)
Total Protein: 7.2 g/dL (ref 6.0–8.5)
eGFR: 48 mL/min/{1.73_m2} — ABNORMAL LOW (ref >59)

## 2023-08-30 LAB — LIPID PANEL
Chol/HDL Ratio: 2.3 {ratio} (ref 0.0–5.0)
Cholesterol, Total: 148 mg/dL (ref 100–199)
HDL: 63 mg/dL (ref >39)
LDL Chol Calc (NIH): 72 mg/dL (ref 0–99)
Triglycerides: 61 mg/dL (ref 0–149)
VLDL Cholesterol Cal: 13 mg/dL (ref 5–40)

## 2023-08-30 LAB — VITAMIN D 25 HYDROXY (VIT D DEFICIENCY, FRACTURES): Vit D, 25-Hydroxy: 46.1 ng/mL (ref 30.0–100.0)

## 2023-08-30 LAB — B12 AND FOLATE PANEL
Folate: 11.5 ng/mL (ref >3.0)
Vitamin B-12: 819 pg/mL (ref 232–1245)

## 2023-08-30 LAB — PSA: Prostate Specific Ag, Serum: 1.3 ng/mL (ref 0.0–4.0)

## 2023-08-31 ENCOUNTER — Ambulatory Visit (INDEPENDENT_AMBULATORY_CARE_PROVIDER_SITE_OTHER): Payer: BC Managed Care – PPO

## 2023-08-31 DIAGNOSIS — I639 Cerebral infarction, unspecified: Secondary | ICD-10-CM | POA: Diagnosis not present

## 2023-08-31 LAB — CUP PACEART REMOTE DEVICE CHECK: Date Time Interrogation Session: 20250213230926

## 2023-08-31 NOTE — Progress Notes (Signed)
Carelink Summary Report / Loop Recorder

## 2023-09-01 ENCOUNTER — Encounter: Payer: Self-pay | Admitting: Cardiology

## 2023-09-10 ENCOUNTER — Other Ambulatory Visit: Payer: Self-pay | Admitting: Internal Medicine

## 2023-09-10 DIAGNOSIS — E1140 Type 2 diabetes mellitus with diabetic neuropathy, unspecified: Secondary | ICD-10-CM

## 2023-09-14 ENCOUNTER — Other Ambulatory Visit: Payer: Self-pay | Admitting: Orthopedic Surgery

## 2023-09-14 DIAGNOSIS — M75122 Complete rotator cuff tear or rupture of left shoulder, not specified as traumatic: Secondary | ICD-10-CM

## 2023-09-16 ENCOUNTER — Other Ambulatory Visit: Payer: Self-pay | Admitting: Internal Medicine

## 2023-09-17 NOTE — Telephone Encounter (Signed)
 Please contact patient for over due appointment for future refills. Thanks

## 2023-09-23 ENCOUNTER — Other Ambulatory Visit

## 2023-09-27 ENCOUNTER — Other Ambulatory Visit

## 2023-10-01 ENCOUNTER — Other Ambulatory Visit: Payer: Self-pay | Admitting: Nurse Practitioner

## 2023-10-01 DIAGNOSIS — E1140 Type 2 diabetes mellitus with diabetic neuropathy, unspecified: Secondary | ICD-10-CM

## 2023-10-01 NOTE — Progress Notes (Signed)
 Carelink Summary Report / Loop Recorder

## 2023-10-05 ENCOUNTER — Ambulatory Visit: Payer: BC Managed Care – PPO

## 2023-10-05 DIAGNOSIS — I639 Cerebral infarction, unspecified: Secondary | ICD-10-CM | POA: Diagnosis not present

## 2023-10-05 LAB — CUP PACEART REMOTE DEVICE CHECK: Date Time Interrogation Session: 20250320231032

## 2023-10-07 ENCOUNTER — Encounter: Payer: Self-pay | Admitting: Cardiology

## 2023-10-16 ENCOUNTER — Other Ambulatory Visit: Payer: Self-pay | Admitting: Nurse Practitioner

## 2023-10-16 DIAGNOSIS — E1140 Type 2 diabetes mellitus with diabetic neuropathy, unspecified: Secondary | ICD-10-CM

## 2023-10-19 ENCOUNTER — Other Ambulatory Visit: Payer: Self-pay | Admitting: Nurse Practitioner

## 2023-10-19 DIAGNOSIS — I10 Essential (primary) hypertension: Secondary | ICD-10-CM

## 2023-10-20 ENCOUNTER — Other Ambulatory Visit: Payer: Self-pay | Admitting: Nurse Practitioner

## 2023-10-20 DIAGNOSIS — Z79899 Other long term (current) drug therapy: Secondary | ICD-10-CM

## 2023-11-09 ENCOUNTER — Ambulatory Visit (INDEPENDENT_AMBULATORY_CARE_PROVIDER_SITE_OTHER): Payer: BC Managed Care – PPO

## 2023-11-09 DIAGNOSIS — I639 Cerebral infarction, unspecified: Secondary | ICD-10-CM | POA: Diagnosis not present

## 2023-11-09 LAB — CUP PACEART REMOTE DEVICE CHECK: Date Time Interrogation Session: 20250424230950

## 2023-11-11 ENCOUNTER — Encounter: Payer: Self-pay | Admitting: Cardiology

## 2023-11-12 ENCOUNTER — Ambulatory Visit: Payer: BC Managed Care – PPO | Admitting: Nurse Practitioner

## 2023-11-13 NOTE — Progress Notes (Signed)
 Carelink Summary Report / Loop Recorder

## 2023-11-14 ENCOUNTER — Encounter: Payer: Self-pay | Admitting: Nurse Practitioner

## 2023-11-14 ENCOUNTER — Ambulatory Visit (INDEPENDENT_AMBULATORY_CARE_PROVIDER_SITE_OTHER): Admitting: Nurse Practitioner

## 2023-11-14 VITALS — BP 134/80 | HR 82 | Temp 98.7°F | Resp 16 | Ht 74.0 in | Wt 217.0 lb

## 2023-11-14 DIAGNOSIS — N1831 Chronic kidney disease, stage 3a: Secondary | ICD-10-CM | POA: Diagnosis not present

## 2023-11-14 DIAGNOSIS — E1122 Type 2 diabetes mellitus with diabetic chronic kidney disease: Secondary | ICD-10-CM | POA: Diagnosis not present

## 2023-11-14 DIAGNOSIS — E1169 Type 2 diabetes mellitus with other specified complication: Secondary | ICD-10-CM

## 2023-11-14 DIAGNOSIS — E785 Hyperlipidemia, unspecified: Secondary | ICD-10-CM

## 2023-11-14 DIAGNOSIS — Z794 Long term (current) use of insulin: Secondary | ICD-10-CM

## 2023-11-14 DIAGNOSIS — E1159 Type 2 diabetes mellitus with other circulatory complications: Secondary | ICD-10-CM

## 2023-11-14 DIAGNOSIS — I152 Hypertension secondary to endocrine disorders: Secondary | ICD-10-CM

## 2023-11-14 DIAGNOSIS — Z79899 Other long term (current) drug therapy: Secondary | ICD-10-CM

## 2023-11-14 MED ORDER — GABAPENTIN 300 MG PO CAPS: ORAL_CAPSULE | ORAL | 0 refills | Status: DC

## 2023-11-14 MED ORDER — SEMAGLUTIDE (1 MG/DOSE) 4 MG/3ML ~~LOC~~ SOPN: 1.0000 mg | PEN_INJECTOR | SUBCUTANEOUS | 5 refills | Status: DC

## 2023-11-14 NOTE — Progress Notes (Signed)
 [facility] [address] [place], [place] [address]  Internal MEDICINE  Office Visit Note  Patient Name: Gabriel Burton  [id number]  [id number]  Date of Service: 11/14/2023  Chief Complaint  Patient presents with   Diabetes   Hypertension   Hyperlipidemia   Follow-up    HPI Gabriel Burton presents for a follow-up visit for diabetes, CKD, hypertension and high cholesterol.  Diabetes -- A1c increased to 7.2 on recent labs. Has been on toujeo  and ozempic  0.5 mg weekly.  CKD stage 3a -- eGFR was 48 on his February labs. Has appt with nephrology coming up  Hypertension -- blood pressure is controlled on amlodipine , olmesartan  and spironolactone .  High cholesterol -- taking atorvastatin      Current Medication: Outpatient Encounter Medications as of 11/14/2023  Medication Sig   amLODipine  (NORVASC ) 10 MG tablet Take 1 tablet (10 mg total) by mouth daily.   aspirin  EC 81 MG tablet Take 81 mg by mouth daily.    atorvastatin  (LIPITOR) 10 MG tablet Take 1 tablet (10 mg total) by mouth at bedtime.   carvedilol  (COREG ) 12.5 MG tablet TAKE 1 & 1/2 (ONE & ONE-HALF) TABLETS BY MOUTH TWICE DAILY WITH MEALS   Cholecalciferol (VITAMIN D3) 5000 units TABS Take 5,000 Units by mouth daily.    CINNAMON PO Take by mouth daily.    clopidogrel  (PLAVIX ) 75 MG tablet Take 1 tablet by mouth once daily   cyclobenzaprine  (FLEXERIL ) 5 MG tablet Take 1 tablet (5 mg total) by mouth at bedtime as needed for muscle spasms.   fluticasone  (FLONASE ) 50 MCG/ACT nasal spray Place 2 sprays into both nostrils daily. (Patient taking differently: Place 2 sprays into both nostrils as needed.)   insulin  glargine, 1 Unit Dial, (TOUJEO  SOLOSTAR) 300 UNIT/ML Solostar Pen INJECT 40 UNITS SUBCUTANEOUSLY ONCE DAILY. MAY INCREASE TO 45 UNITS AS DIRECTED.   nitroGLYCERIN  (NITROSTAT ) 0.4 MG SL tablet DISSOLVE ONE TABLET UNDER THE TONGUE EVERY 5 MINUTES AS NEEDED FOR CHEST PAIN.  DO NOT EXCEED A TOTAL OF 3 DOSES IN 15 MINUTES    olmesartan  (BENICAR ) 20 MG tablet Take 1 tablet by mouth once daily   pantoprazole  (PROTONIX ) 40 MG tablet Take 1 tablet (40 mg total) by mouth daily.   Semaglutide , 1 MG/DOSE, 4 MG/3ML SOPN Inject 1 mg as directed once a week.   tadalafil  (CIALIS ) 20 MG tablet Take 1 tablet (20 mg total) by mouth daily as needed for erectile dysfunction.   traMADol  (ULTRAM ) 50 MG tablet One tab po bid prn for pain   TRUEPLUS 5-BEVEL PEN NEEDLES 32G X 4 MM MISC USE  TWICE DAILY AS DIRECTED   vitamin C (ASCORBIC ACID) 500 MG tablet Take 500 mg by mouth daily.    [DISCONTINUED] gabapentin  (NEURONTIN ) 300 MG capsule Take 1 tablet by mouth in the morning and 2 tablets by mouth in the evening every day.   [DISCONTINUED] OZEMPIC , 0.25 OR 0.5 MG/DOSE, 2 MG/3ML SOPN INJECT 0.5 MG UNDER THE SKIN ONCE WEEKLY   gabapentin  (NEURONTIN ) 300 MG capsule Take 1 tablet by mouth in the morning and 2 tablets by mouth in the evening every day.   spironolactone  (ALDACTONE ) 25 MG tablet Take 1 tablet (25 mg total) by mouth once for 1 dose.   No facility-administered encounter medications on file as of 11/14/2023.    Surgical History: Past Surgical History:  Procedure Laterality Date   LOOP RECORDER IMPLANT  06/14/2022    Medical History: Past Medical History:  Diagnosis Date   Diabetes  mellitus without complication (HCC)    Diastolic dysfunction    a. 09/2018 Echo: EF 63%, diast dysfxn. Mild conc LVH. Mild TR.   History of stress test    a. 09/2018 ETT: Ex time 3:50. BP 207/90. Inconclusive 2/2 LBBB.  Stopped 2/2 claudication; b. 12/2018 Lexi MV: fixed septal/apical defect - felt to be artifact 2/2 LBBB. Very mild cor Ca2+ and Ao atherosclerosis. Low-mod risk study. EF 43%.   Hyperlipidemia    Hypertension    a. 08/2020 Renal artery duplex: No evidence of RAS.   Left bundle branch block    Stroke [facility])     Family History: Family History  Problem Relation Age of Onset   Hypertension Mother    Hypertension Father    Heart  attack Brother 28    Social History   Socioeconomic History   Marital status: Widowed    Spouse name: Not on file   Number of children: Not on file   Years of education: Not on file   Highest education level: Not on file  Occupational History   Not on file  Tobacco Use   Smoking status: Former    Current packs/day: 0.00    Average packs/day: 0.4 packs/day for 8.0 years (3.2 ttl pk-yrs)    Types: Cigarettes    Start date: 9    Quit date: 2000    Years since quitting: 25.3   Smokeless tobacco: Never  Vaping Use   Vaping status: Never Used  Substance and Sexual Activity   Alcohol use: Never   Drug use: Never   Sexual activity: Not on file  Other Topics Concern   Not on file  Social History Narrative   Not on file   Social Drivers of Health   Financial Resource Strain: Low Risk  (09/10/2023)   Received from [facility]   Overall Financial Resource Strain (CARDIA)    Difficulty of Paying Living Expenses: Not very hard  Food Insecurity: No Food Insecurity (09/10/2023)   Received from [facility]   Hunger Vital Sign    Worried About Running Out of Food in the Last Year: Never true    Ran Out of Food in the Last Year: Never true  Transportation Needs: No Transportation Needs (09/10/2023)   Received from [facility]    In the past 12 months, has lack of transportation kept you from medical appointments or from getting medications?: No    Lack of Transportation (Non-Medical): No  Physical Activity: Not on file  Stress: Not on file  Social Connections: Not on file  Intimate Partner Violence: Not on file      Review of Systems  Constitutional:  Positive for fatigue.  Respiratory: Negative.  Negative for cough, chest tightness, shortness of breath and wheezing.   Cardiovascular: Negative.  Negative for chest pain and palpitations.  Musculoskeletal:  Positive for arthralgias, back pain,  neck pain and neck stiffness.  Neurological:  Positive for weakness and headaches.  Psychiatric/Behavioral: Negative.      Vital Signs: BP 134/80   Pulse 82   Temp 98.7 F (37.1 C)   Resp 16   Ht 6\' 2"  (1.88 m)   Wt 217 lb (98.4 kg)   SpO2 97%   BMI 27.86 kg/m    Physical Exam Vitals reviewed.  Constitutional:      General: He is not in acute distress.    Appearance: Normal appearance. He is not ill-appearing.  HENT:     Head: Normocephalic and atraumatic.  Eyes:     Pupils: Pupils are equal, round, and reactive to light.  Cardiovascular:     Rate and Rhythm: Normal rate and regular rhythm.  Pulmonary:     Effort: Pulmonary effort is normal. No respiratory distress.  Neurological:     Mental Status: He is alert and oriented to person, place, and time.  Psychiatric:        Mood and Affect: Mood normal.        Behavior: Behavior normal.        Assessment/Plan: 1. Type 2 diabetes mellitus with stage 3a chronic kidney disease, with long-term current use of insulin  (HCC) Ozempic  dose increased to 1 mg weekly. Follow up in 4 months to repeat A1c.  - Semaglutide , 1 MG/DOSE, 4 MG/3ML SOPN; Inject 1 mg as directed once a week.  Dispense: 3 mL; Refill: 5  2. Stage 3a chronic kidney disease (HCC) (Primary) Follow up with nephrology and continue olmesartan  as prescribed.   3. Hypertension associated with diabetes (HCC) Stable, continue amlodipine , olmesartan  and spironolactone  as prescribed.  4. Hyperlipidemia associated with type 2 diabetes mellitus (HCC) Continue atorvastatin  as prescribed.   5. Encounter for medication review Continue gabapentin  as prescribed.  - gabapentin  (NEURONTIN ) 300 MG capsule; Take 1 tablet by mouth in the morning and 2 tablets by mouth in the evening every day.  Dispense: 270 capsule; Refill: 0   General Counseling: Cordarrel verbalizes understanding of the findings of todays visit and agrees with plan of treatment. I have discussed any  further diagnostic evaluation that may be needed or ordered today. We also reviewed his medications today. he has been encouraged to call the office with any questions or concerns that should arise related to todays visit.    No orders of the defined types were placed in this encounter.   Meds ordered this encounter  Medications   Semaglutide , 1 MG/DOSE, 4 MG/3ML SOPN    Sig: Inject 1 mg as directed once a week.    Dispense:  3 mL    Refill:  5    Dx code E11.65, note increased dose, fill new script today. Discontinue 0.5mg  dose.   gabapentin  (NEURONTIN ) 300 MG capsule    Sig: Take 1 tablet by mouth in the morning and 2 tablets by mouth in the evening every day.    Dispense:  270 capsule    Refill:  0    Return in about 4 months (around 03/15/2024) for F/U, Recheck A1C, Esaul Dorwart PCP and needs AWV scheduled in december after 07/12/24.   Total time spent:30 Minutes Time spent includes review of chart, medications, test results, and follow up plan with the patient.   [place] Controlled Substance Database was reviewed by me.  This patient was seen by Laurence Pons, FNP-C in collaboration with Dr. Verneta Gone as a part of collaborative care agreement.   Santasia Rew R. Bobbi Burow, MSN, FNP-C Internal medicine

## 2023-11-15 ENCOUNTER — Encounter: Payer: Self-pay | Admitting: Nurse Practitioner

## 2023-11-24 ENCOUNTER — Other Ambulatory Visit: Payer: Self-pay | Admitting: Internal Medicine

## 2023-11-24 DIAGNOSIS — E1140 Type 2 diabetes mellitus with diabetic neuropathy, unspecified: Secondary | ICD-10-CM

## 2023-11-26 ENCOUNTER — Telehealth: Payer: Self-pay

## 2023-11-26 ENCOUNTER — Other Ambulatory Visit: Payer: Self-pay

## 2023-11-26 MED ORDER — AZITHROMYCIN 250 MG PO TABS: ORAL_TABLET | ORAL | 0 refills | Status: DC

## 2023-11-26 NOTE — Telephone Encounter (Signed)
 Pt called that he having head cold and sinus infection as per alyssa sent Zpak

## 2023-11-27 ENCOUNTER — Other Ambulatory Visit: Payer: Self-pay | Admitting: Internal Medicine

## 2023-11-27 DIAGNOSIS — E1140 Type 2 diabetes mellitus with diabetic neuropathy, unspecified: Secondary | ICD-10-CM

## 2023-12-14 ENCOUNTER — Ambulatory Visit (INDEPENDENT_AMBULATORY_CARE_PROVIDER_SITE_OTHER): Payer: BC Managed Care – PPO

## 2023-12-14 DIAGNOSIS — I639 Cerebral infarction, unspecified: Secondary | ICD-10-CM | POA: Diagnosis not present

## 2023-12-14 LAB — CUP PACEART REMOTE DEVICE CHECK: Date Time Interrogation Session: 20250529232404

## 2023-12-16 ENCOUNTER — Other Ambulatory Visit: Payer: Self-pay | Admitting: Internal Medicine

## 2023-12-17 ENCOUNTER — Ambulatory Visit: Payer: Self-pay | Admitting: Cardiology

## 2023-12-17 NOTE — Progress Notes (Signed)
 Carelink Summary Report / Loop Recorder

## 2023-12-18 ENCOUNTER — Other Ambulatory Visit: Payer: Self-pay | Admitting: Internal Medicine

## 2023-12-20 ENCOUNTER — Ambulatory Visit: Admitting: Cardiology

## 2023-12-20 ENCOUNTER — Ambulatory Visit: Admitting: Internal Medicine

## 2024-01-02 ENCOUNTER — Other Ambulatory Visit: Payer: Self-pay | Admitting: Nurse Practitioner

## 2024-01-02 DIAGNOSIS — E1140 Type 2 diabetes mellitus with diabetic neuropathy, unspecified: Secondary | ICD-10-CM

## 2024-01-04 ENCOUNTER — Ambulatory Visit: Attending: Medical | Admitting: Medical

## 2024-01-04 ENCOUNTER — Encounter: Payer: Self-pay | Admitting: Medical

## 2024-01-04 VITALS — BP 138/80 | HR 71 | Ht 74.0 in | Wt 214.8 lb

## 2024-01-04 DIAGNOSIS — I10 Essential (primary) hypertension: Secondary | ICD-10-CM | POA: Diagnosis not present

## 2024-01-04 DIAGNOSIS — I639 Cerebral infarction, unspecified: Secondary | ICD-10-CM

## 2024-01-04 DIAGNOSIS — I447 Left bundle-branch block, unspecified: Secondary | ICD-10-CM

## 2024-01-04 DIAGNOSIS — E782 Mixed hyperlipidemia: Secondary | ICD-10-CM

## 2024-01-04 DIAGNOSIS — R600 Localized edema: Secondary | ICD-10-CM | POA: Diagnosis not present

## 2024-01-04 MED ORDER — SPIRONOLACTONE 25 MG PO TABS: 25.0000 mg | ORAL_TABLET | Freq: Every day | ORAL | 1 refills | Status: DC

## 2024-01-04 NOTE — Progress Notes (Signed)
 Cardiology Office Note   Date:  01/04/2024  ID:  Gabriel Burton, Gabriel Burton [birth year], MRN [id number] PCP: Laurence Pons, NP  [facility] Providers Cardiologist:  Sammy Crisp, MD Electrophysiologist:  Boyce Byes, MD   History of Present Illness Gabriel Burton is a 73 y.o. male with a hx of cryptogenic stroke s/p ILR, HTN, HLD, DM2, chronic LBBB who presents for follow-up.    The patient was admitted in June 2023 with occipital cortical and right superior cerebellar infarct.  He reported residual visual disturbance after the stroke.  Follow-up 14-day heart monitor was negative.  Patient saw EP and he underwent ILR implant.  Loop recorder has not shown A-fib/flutter.   The patient was last seen 05/2023 and was overall stable from a cardiac perspective.   Today, the patient reports he is overall doing well. He needs refills of spironolactone . He denies chest pain or SOB. He walks a lot on the job. Exercises limited due to shoulder pain. He has chronic lower left leg edema. No lightheadedness or dizziness.   Studies Reviewed EKG Interpretation Date/Time:  Friday January 04 2024 10:38:52 EDT Ventricular Rate:  71 PR Interval:  244 QRS Duration:  150 QT Interval:  408 QTC Calculation: 443 R Axis:   -53  Text Interpretation: Sinus rhythm with 1st degree A-V block Left axis deviation Left bundle branch block When compared with ECG of 07-Jun-2023 15:29, No significant change was found Confirmed by Gennaro Khat, Cierrah Dace ([id number]) on 01/04/2024 10:43:17 AM    Heart monitor 01/2022   The patient was monitored for 12 days, 18 hours.   The predominant rhythm was sinus with an average rate of 79 bpm (range 53-123 bpm in sinus).  Underlying first-degree AV block and intraventricular conduction delay noted.   There were rare PACs and PVCs.   Three supraventricular runs were observed, lasting up to 6 beats with a maximum rate of 160 bpm.   No sustained arrhythmia or prolonged pause was  observed.   Patient triggered events corresponded to sinus rhythm.   Predominantly sinus rhythm with rare PACs and PVCs as well as a few brief runs of PSVT.  Echo 12/2021 1. Left ventricular ejection fraction, by estimation, is 50 %. The left  ventricle has low normal function. The left ventricle demonstrates  regional wall motion abnormalities (septal wall motion abnormality noted  in the setting of bundle branch block).  Left ventricular diastolic parameters are consistent with Grade I  diastolic dysfunction (impaired relaxation).   2. Right ventricular systolic function is normal. The right ventricular  size is normal.   3. Left atrial size was mildly dilated.   4. The mitral valve is normal in structure. No evidence of mitral valve  regurgitation. No evidence of mitral stenosis.   5. The aortic valve is tricuspid. Aortic valve regurgitation is not  visualized. No aortic stenosis is present.   6. The inferior vena cava is normal in size with greater than 50%  respiratory variability, suggesting right atrial pressure of 3 mmHg.       Physical Exam VS:  BP 138/80   Pulse 71   Ht 6' 2 (1.88 m)   Wt 214 lb 12.8 oz (97.4 kg)   SpO2 95%   BMI 27.58 kg/m        Wt Readings from Last 3 Encounters:  01/04/24 214 lb 12.8 oz (97.4 kg)  11/14/23 217 lb (98.4 kg)  08/29/23 218 lb 6.4 oz (99.1 kg)    GEN: Well  nourished, well developed in no acute distress NECK: No JVD; No carotid bruits CARDIAC: RRR, no murmurs, rubs, gallops RESPIRATORY:  Clear to auscultation without rales, wheezing or rhonchi  ABDOMEN: Soft, non-tender, non-distended EXTREMITIES:  mild lower leg edema; No deformity   ASSESSMENT AND PLAN  Cryptogenic stroke No residual deficits. ILR so far unremarkable. Continue low dose statin, ASA, and Plavix .   HTN BP is reasonable, continue amlodipine  5mg  daily, Coreg  12.5mg BID, Olmesartan  20mg  daily. I will refill spironolactone  25mg  daily.   Lower leg edema The  patient has chronic lower leg edema that is unchanged. Refill spironolactone  as above.   HLD LDL 72, goal<70. Continue Lipitor 10mg  daily.      Dispo: follow-up in 6 months  Signed, Sadrac Zeoli Rebekah Canada, PA-C

## 2024-01-04 NOTE — Patient Instructions (Signed)

## 2024-01-14 ENCOUNTER — Ambulatory Visit (INDEPENDENT_AMBULATORY_CARE_PROVIDER_SITE_OTHER)

## 2024-01-14 DIAGNOSIS — I639 Cerebral infarction, unspecified: Secondary | ICD-10-CM | POA: Diagnosis not present

## 2024-01-14 LAB — CUP PACEART REMOTE DEVICE CHECK: Date Time Interrogation Session: 20250629233958

## 2024-01-16 ENCOUNTER — Ambulatory Visit: Payer: Self-pay | Admitting: Cardiology

## 2024-01-17 ENCOUNTER — Other Ambulatory Visit: Payer: Self-pay | Admitting: Nurse Practitioner

## 2024-01-17 DIAGNOSIS — Z79899 Other long term (current) drug therapy: Secondary | ICD-10-CM

## 2024-01-23 ENCOUNTER — Other Ambulatory Visit: Payer: Self-pay | Admitting: Nurse Practitioner

## 2024-01-23 DIAGNOSIS — Z79899 Other long term (current) drug therapy: Secondary | ICD-10-CM

## 2024-01-29 ENCOUNTER — Other Ambulatory Visit: Payer: Self-pay | Admitting: Nurse Practitioner

## 2024-01-29 DIAGNOSIS — E1140 Type 2 diabetes mellitus with diabetic neuropathy, unspecified: Secondary | ICD-10-CM

## 2024-01-29 NOTE — Progress Notes (Signed)
 Carelink Summary Report / Loop Recorder

## 2024-01-30 ENCOUNTER — Other Ambulatory Visit: Payer: Self-pay | Admitting: Nurse Practitioner

## 2024-01-30 DIAGNOSIS — E1140 Type 2 diabetes mellitus with diabetic neuropathy, unspecified: Secondary | ICD-10-CM

## 2024-02-13 ENCOUNTER — Ambulatory Visit: Admitting: Nurse Practitioner

## 2024-02-13 ENCOUNTER — Encounter: Payer: Self-pay | Admitting: Nurse Practitioner

## 2024-02-13 ENCOUNTER — Ambulatory Visit (INDEPENDENT_AMBULATORY_CARE_PROVIDER_SITE_OTHER): Admitting: Nurse Practitioner

## 2024-02-13 VITALS — BP 138/82 | HR 88 | Temp 98.1°F | Resp 16 | Ht 74.0 in | Wt 211.4 lb

## 2024-02-13 DIAGNOSIS — I152 Hypertension secondary to endocrine disorders: Secondary | ICD-10-CM

## 2024-02-13 DIAGNOSIS — E1122 Type 2 diabetes mellitus with diabetic chronic kidney disease: Secondary | ICD-10-CM | POA: Diagnosis not present

## 2024-02-13 DIAGNOSIS — E1159 Type 2 diabetes mellitus with other circulatory complications: Secondary | ICD-10-CM

## 2024-02-13 DIAGNOSIS — Z794 Long term (current) use of insulin: Secondary | ICD-10-CM | POA: Diagnosis not present

## 2024-02-13 DIAGNOSIS — G8929 Other chronic pain: Secondary | ICD-10-CM

## 2024-02-13 DIAGNOSIS — M5412 Radiculopathy, cervical region: Secondary | ICD-10-CM

## 2024-02-13 DIAGNOSIS — N1831 Chronic kidney disease, stage 3a: Secondary | ICD-10-CM | POA: Diagnosis not present

## 2024-02-13 DIAGNOSIS — E785 Hyperlipidemia, unspecified: Secondary | ICD-10-CM

## 2024-02-13 DIAGNOSIS — E1169 Type 2 diabetes mellitus with other specified complication: Secondary | ICD-10-CM

## 2024-02-13 DIAGNOSIS — Z8673 Personal history of transient ischemic attack (TIA), and cerebral infarction without residual deficits: Secondary | ICD-10-CM

## 2024-02-13 DIAGNOSIS — K219 Gastro-esophageal reflux disease without esophagitis: Secondary | ICD-10-CM

## 2024-02-13 DIAGNOSIS — M25512 Pain in left shoulder: Secondary | ICD-10-CM

## 2024-02-13 DIAGNOSIS — N528 Other male erectile dysfunction: Secondary | ICD-10-CM

## 2024-02-13 LAB — POCT GLYCOSYLATED HEMOGLOBIN (HGB A1C): Hemoglobin A1C: 6.6 % — AB (ref 4.0–5.6)

## 2024-02-13 MED ORDER — GABAPENTIN 300 MG PO CAPS: ORAL_CAPSULE | ORAL | 3 refills | Status: AC

## 2024-02-13 MED ORDER — CLOPIDOGREL BISULFATE 75 MG PO TABS: 75.0000 mg | ORAL_TABLET | Freq: Every day | ORAL | 3 refills | Status: AC

## 2024-02-13 MED ORDER — TADALAFIL 20 MG PO TABS: 20.0000 mg | ORAL_TABLET | Freq: Every day | ORAL | 11 refills | Status: DC | PRN

## 2024-02-13 MED ORDER — SEMAGLUTIDE (1 MG/DOSE) 4 MG/3ML ~~LOC~~ SOPN: 1.0000 mg | PEN_INJECTOR | SUBCUTANEOUS | 5 refills | Status: DC

## 2024-02-13 MED ORDER — OLMESARTAN MEDOXOMIL 20 MG PO TABS: 20.0000 mg | ORAL_TABLET | Freq: Every day | ORAL | 3 refills | Status: AC

## 2024-02-13 MED ORDER — PANTOPRAZOLE SODIUM 40 MG PO TBEC: 40.0000 mg | DELAYED_RELEASE_TABLET | Freq: Every day | ORAL | 3 refills | Status: AC

## 2024-02-13 MED ORDER — ATORVASTATIN CALCIUM 10 MG PO TABS: 10.0000 mg | ORAL_TABLET | Freq: Every day | ORAL | 3 refills | Status: AC

## 2024-02-13 MED ORDER — AMLODIPINE BESYLATE 10 MG PO TABS: 10.0000 mg | ORAL_TABLET | Freq: Every day | ORAL | 3 refills | Status: AC

## 2024-02-13 NOTE — Progress Notes (Signed)
 [facility] [address] [place], [place] [address]  Internal MEDICINE  Office Visit Note  Patient Name: Gabriel Burton  [id number]  [id number]  Date of Service: 02/13/2024  Chief Complaint  Patient presents with   Diabetes   Hypertension   Hyperlipidemia   Follow-up    HPI Tre presents for a follow-up visit for diabetes, shoulder pain, GERD, hypertension and high cholesterol.  Diabetes -- A1c improved to 6.6 today from 7.2 earlier this year. He is stable, currently on ozempic  and toujeo .  Left shoulder pain - seeing orthopedic , had a cortisone injection a few weeks ago. Next step will be MRI and they may discuss surgery in the future.  GERD -- controlled with pantoprazole .  Hypertension -- controlled with current medications, followed by cardiology as well  High cholesterol -- currently taking atorvastatin      Current Medication: Outpatient Encounter Medications as of 02/13/2024  Medication Sig   traMADol  (ULTRAM ) 50 MG tablet Take 50 mg by mouth.   amLODipine  (NORVASC ) 10 MG tablet Take 1 tablet (10 mg total) by mouth daily.   aspirin  EC 81 MG tablet Take 81 mg by mouth daily.    atorvastatin  (LIPITOR) 10 MG tablet Take 1 tablet (10 mg total) by mouth at bedtime.   carvedilol  (COREG ) 12.5 MG tablet TAKE 1 & 1/2 (ONE & ONE-HALF) TABLETS BY MOUTH TWICE DAILY WITH MEALS   Cholecalciferol (VITAMIN D3) 5000 units TABS Take 5,000 Units by mouth daily.    CINNAMON PO Take by mouth daily.    clopidogrel  (PLAVIX ) 75 MG tablet Take 1 tablet (75 mg total) by mouth daily.   fluticasone  (FLONASE ) 50 MCG/ACT nasal spray Place 2 sprays into both nostrils daily.   gabapentin  (NEURONTIN ) 300 MG capsule Take 1 tablet by mouth in the morning and 2 tablets by mouth in the evening every day.   insulin  glargine, 1 Unit Dial, (TOUJEO  SOLOSTAR) 300 UNIT/ML Solostar Pen INJECT 40 UNITS SUBCUTANEOUSLY ONCE DAILY, MAY INCREASE TO 45 UNITS AS DIRECTED   nitroGLYCERIN  (NITROSTAT ) 0.4  MG SL tablet DISSOLVE ONE TABLET UNDER THE TONGUE EVERY 5 MINUTES AS NEEDED FOR CHEST PAIN.  DO NOT EXCEED A TOTAL OF 3 DOSES IN 15 MINUTES   olmesartan  (BENICAR ) 20 MG tablet Take 1 tablet (20 mg total) by mouth daily.   pantoprazole  (PROTONIX ) 40 MG tablet Take 1 tablet (40 mg total) by mouth daily.   Semaglutide , 1 MG/DOSE, 4 MG/3ML SOPN Inject 1 mg as directed once a week.   spironolactone  (ALDACTONE ) 25 MG tablet Take 1 tablet (25 mg total) by mouth daily.   tadalafil  (CIALIS ) 20 MG tablet Take 1 tablet (20 mg total) by mouth daily as needed for erectile dysfunction.   TRUEPLUS 5-BEVEL PEN NEEDLES 32G X 4 MM MISC USE  TWICE DAILY AS DIRECTED   vitamin C (ASCORBIC ACID) 500 MG tablet Take 500 mg by mouth daily.    [DISCONTINUED] amLODipine  (NORVASC ) 10 MG tablet Take 1 tablet (10 mg total) by mouth daily.   [DISCONTINUED] atorvastatin  (LIPITOR) 10 MG tablet Take 1 tablet (10 mg total) by mouth at bedtime.   [DISCONTINUED] clopidogrel  (PLAVIX ) 75 MG tablet Take 1 tablet by mouth once daily   [DISCONTINUED] cyclobenzaprine  (FLEXERIL ) 5 MG tablet Take 1 tablet (5 mg total) by mouth at bedtime as needed for muscle spasms.   [DISCONTINUED] gabapentin  (NEURONTIN ) 300 MG capsule Take 1 tablet by mouth in the morning and 2 tablets by mouth in the evening every day.   [  DISCONTINUED] olmesartan  (BENICAR ) 20 MG tablet Take 1 tablet by mouth once daily   [DISCONTINUED] pantoprazole  (PROTONIX ) 40 MG tablet Take 1 tablet (40 mg total) by mouth daily.   [DISCONTINUED] Semaglutide , 1 MG/DOSE, 4 MG/3ML SOPN Inject 1 mg as directed once a week.   [DISCONTINUED] tadalafil  (CIALIS ) 20 MG tablet Take 1 tablet (20 mg total) by mouth daily as needed for erectile dysfunction.   [DISCONTINUED] traMADol  (ULTRAM ) 50 MG tablet One tab po bid prn for pain   No facility-administered encounter medications on file as of 02/13/2024.    Surgical History: Past Surgical History:  Procedure Laterality Date   LOOP RECORDER  IMPLANT  06/14/2022    Medical History: Past Medical History:  Diagnosis Date   Diabetes mellitus without complication (HCC)    Diastolic dysfunction    a. 09/2018 Echo: EF 63%, diast dysfxn. Mild conc LVH. Mild TR.   History of stress test    a. 09/2018 ETT: Ex time 3:50. BP 207/90. Inconclusive 2/2 LBBB.  Stopped 2/2 claudication; b. 12/2018 Lexi MV: fixed septal/apical defect - felt to be artifact 2/2 LBBB. Very mild cor Ca2+ and Ao atherosclerosis. Low-mod risk study. EF 43%.   Hyperlipidemia    Hypertension    a. 08/2020 Renal artery duplex: No evidence of RAS.   Left bundle branch block    Stroke [facility])     Family History: Family History  Problem Relation Age of Onset   Hypertension Mother    Hypertension Father    Heart attack Brother 39    Social History   Socioeconomic History   Marital status: Widowed    Spouse name: Not on file   Number of children: Not on file   Years of education: Not on file   Highest education level: Not on file  Occupational History   Not on file  Tobacco Use   Smoking status: Former    Current packs/day: 0.00    Average packs/day: 0.4 packs/day for 8.0 years (3.2 ttl pk-yrs)    Types: Cigarettes    Start date: 44    Quit date: 2000    Years since quitting: 25.5   Smokeless tobacco: Never  Vaping Use   Vaping status: Never Used  Substance and Sexual Activity   Alcohol use: Never   Drug use: Never   Sexual activity: Not on file  Other Topics Concern   Not on file  Social History Narrative   Not on file   Social Drivers of Health   Financial Resource Strain: Low Risk  (01/29/2024)   Received from [facility]   Overall Financial Resource Strain (CARDIA)    Difficulty of Paying Living Expenses: Not very hard  Food Insecurity: No Food Insecurity (01/29/2024)   Received from [facility]   Hunger Vital Sign    Within the past 12 months, you worried that your food would run out before you got the  money to buy more.: Never true    Within the past 12 months, the food you bought just didn't last and you didn't have money to get more.: Never true  Transportation Needs: No Transportation Needs (01/29/2024)   Received from [facility] - Transportation    In the past 12 months, has lack of transportation kept you from medical appointments or from getting medications?: No    Lack of Transportation (Non-Medical): No  Physical Activity: Not on file  Stress: Not on file  Social Connections: Not  on file  Intimate Partner Violence: Not on file      Review of Systems  Constitutional:  Positive for fatigue.  Respiratory: Negative.  Negative for cough, chest tightness, shortness of breath and wheezing.   Cardiovascular: Negative.  Negative for chest pain and palpitations.  Musculoskeletal:  Positive for arthralgias, back pain, neck pain and neck stiffness.  Neurological:  Positive for weakness and headaches.  Psychiatric/Behavioral: Negative.      Vital Signs: BP 138/82   Pulse 88   Temp 98.1 F (36.7 C)   Resp 16   Ht 6' 2 (1.88 m)   Wt 211 lb 6.4 oz (95.9 kg)   SpO2 97%   BMI 27.14 kg/m    Physical Exam Vitals reviewed.  Constitutional:      General: He is not in acute distress.    Appearance: Normal appearance. He is not ill-appearing.  HENT:     Head: Normocephalic and atraumatic.  Eyes:     Pupils: Pupils are equal, round, and reactive to light.  Cardiovascular:     Rate and Rhythm: Normal rate and regular rhythm.  Pulmonary:     Effort: Pulmonary effort is normal. No respiratory distress.  Neurological:     Mental Status: He is alert and oriented to person, place, and time.  Psychiatric:        Mood and Affect: Mood normal.        Behavior: Behavior normal.        Assessment/Plan: 1. Type 2 diabetes mellitus with stage 3a chronic kidney disease, with long-term current use of insulin  (HCC) (Primary) A1c is stable at 6.6 today.  Continue ozempic  and toujeo  as prescribed.  - POCT glycosylated hemoglobin (Hb A1C) - Semaglutide , 1 MG/DOSE, 4 MG/3ML SOPN; Inject 1 mg as directed once a week.  Dispense: 3 mL; Refill: 5  2. Hypertension associated with diabetes (HCC) Stable, continue amlodipine  and olmesartan  as prescribed.  - olmesartan  (BENICAR ) 20 MG tablet; Take 1 tablet (20 mg total) by mouth daily.  Dispense: 90 tablet; Refill: 3 - amLODipine  (NORVASC ) 10 MG tablet; Take 1 tablet (10 mg total) by mouth daily.  Dispense: 90 tablet; Refill: 3  3. Hyperlipidemia associated with type 2 diabetes mellitus (HCC) Continue atorvastatin  as prescribed.  - atorvastatin  (LIPITOR) 10 MG tablet; Take 1 tablet (10 mg total) by mouth at bedtime.  Dispense: 90 tablet; Refill: 3  4. Cervical radiculopathy Continue gabapentin  as prescribed.  - gabapentin  (NEURONTIN ) 300 MG capsule; Take 1 tablet by mouth in the morning and 2 tablets by mouth in the evening every day.  Dispense: 270 capsule; Refill: 3 - traMADol  (ULTRAM ) 50 MG tablet; Take 50 mg by mouth.  5. Chronic pain in left shoulder Continue prn tramadol  from orthopedic surgeon.  - traMADol  (ULTRAM ) 50 MG tablet; Take 50 mg by mouth.  6. Gastroesophageal reflux disease without esophagitis Continue pantoprazole  as prescribed.  - pantoprazole  (PROTONIX ) 40 MG tablet; Take 1 tablet (40 mg total) by mouth daily.  Dispense: 90 tablet; Refill: 3  7. Other male erectile dysfunction Continue tadalafil  as prescribed.  - tadalafil  (CIALIS ) 20 MG tablet; Take 1 tablet (20 mg total) by mouth daily as needed for erectile dysfunction.  Dispense: 10 tablet; Refill: 11  8. History of stroke Continue plavix  as prescribed  - clopidogrel  (PLAVIX ) 75 MG tablet; Take 1 tablet (75 mg total) by mouth daily.  Dispense: 90 tablet; Refill: 3   General Counseling: Nicoli verbalizes understanding of the findings of todays visit and agrees  with plan of treatment. I have discussed any further  diagnostic evaluation that may be needed or ordered today. We also reviewed his medications today. he has been encouraged to call the office with any questions or concerns that should arise related to todays visit.    Orders Placed This Encounter  Procedures   POCT glycosylated hemoglobin (Hb A1C)    Meds ordered this encounter  Medications   tadalafil  (CIALIS ) 20 MG tablet    Sig: Take 1 tablet (20 mg total) by mouth daily as needed for erectile dysfunction.    Dispense:  10 tablet    Refill:  11    For future refills   Semaglutide , 1 MG/DOSE, 4 MG/3ML SOPN    Sig: Inject 1 mg as directed once a week.    Dispense:  3 mL    Refill:  5    For future refills   pantoprazole  (PROTONIX ) 40 MG tablet    Sig: Take 1 tablet (40 mg total) by mouth daily.    Dispense:  90 tablet    Refill:  3    patient will call for refills.   olmesartan  (BENICAR ) 20 MG tablet    Sig: Take 1 tablet (20 mg total) by mouth daily.    Dispense:  90 tablet    Refill:  3   gabapentin  (NEURONTIN ) 300 MG capsule    Sig: Take 1 tablet by mouth in the morning and 2 tablets by mouth in the evening every day.    Dispense:  270 capsule    Refill:  3   clopidogrel  (PLAVIX ) 75 MG tablet    Sig: Take 1 tablet (75 mg total) by mouth daily.    Dispense:  90 tablet    Refill:  3    For future refills   atorvastatin  (LIPITOR) 10 MG tablet    Sig: Take 1 tablet (10 mg total) by mouth at bedtime.    Dispense:  90 tablet    Refill:  3    For future refills   amLODipine  (NORVASC ) 10 MG tablet    Sig: Take 1 tablet (10 mg total) by mouth daily.    Dispense:  90 tablet    Refill:  3    For future refills    Return for previously scheduled, CPE, Yenny Kosa PCP in late december. .   Total time spent:30 Minutes Time spent includes review of chart, medications, test results, and follow up plan with the patient.   [place] Controlled Substance Database was reviewed by me.  This patient was seen by Mardy Maxin, FNP-C in  collaboration with Dr. Sigrid Bathe as a part of collaborative care agreement.   Kamaiyah Uselton R. Maxin, MSN, FNP-C Internal medicine

## 2024-02-14 ENCOUNTER — Ambulatory Visit

## 2024-02-14 DIAGNOSIS — I639 Cerebral infarction, unspecified: Secondary | ICD-10-CM

## 2024-02-14 LAB — CUP PACEART REMOTE DEVICE CHECK: Date Time Interrogation Session: 20250730233109

## 2024-02-14 NOTE — Progress Notes (Signed)
 Carelink Summary Report / Loop Recorder

## 2024-02-18 ENCOUNTER — Ambulatory Visit: Payer: Self-pay | Admitting: Cardiology

## 2024-02-27 ENCOUNTER — Telehealth: Payer: Self-pay

## 2024-02-27 NOTE — Telephone Encounter (Signed)
 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  Forwarded to them and waiting for response.

## 2024-02-28 NOTE — Telephone Encounter (Signed)
 Noted, adjusted T wave blanking back to in online portal per Dr. Ramona recommendations.

## 2024-02-28 NOTE — Telephone Encounter (Signed)
 Spoke with Leontine (MDT rep) and MDT Engineer regarding patient's TWOS.    CV has set patient's TWAVE blanking to .  This is not recommended by MDT as this will cover up any Tachy events greater than 120bpm. Typically, they don't recommend setting this blanking greater than .   Engineer states that patient has has only had 24 of these false episodes since 06/14/22. However, there have been several over the past month.   ILR implanted for CVA. To date - AF events have not been detected.  Will consult with Dr. Cindie on his opinion regarding his recommendation for TWOS blanking to decrease amount of false tachy episodes.

## 2024-03-11 NOTE — Telephone Encounter (Signed)
 Routing to Dr. Cindie to obtain order to increase tachy detection to > 176 bpm to collect actionable findings per CV.  Please see below.    2 false tachy events c/w TWOS; known finding.  02/28/24- limited reprogramming per industry and Dr. Cindie wants blanking at 300 ms.   Tachy detection is at 158 bpm.  Device is noting rates in the 170's.  Consider increasing tachy detection rate to >176 bpm to collect actionable findings.

## 2024-03-16 ENCOUNTER — Ambulatory Visit

## 2024-03-16 DIAGNOSIS — I639 Cerebral infarction, unspecified: Secondary | ICD-10-CM

## 2024-03-17 NOTE — Telephone Encounter (Signed)
 Tachy detection increased to 176 bpm in [facility].

## 2024-03-18 ENCOUNTER — Ambulatory Visit: Attending: Cardiology | Admitting: Cardiology

## 2024-03-18 ENCOUNTER — Ambulatory Visit: Admitting: Nurse Practitioner

## 2024-03-18 NOTE — Progress Notes (Deleted)
 Electrophysiology Clinic Note    Date:  03/18/2024  Patient ID:  Gabriel Burton, Gabriel Burton [birth year], MRN [id number] PCP:  Liana Fish, NP  Cardiologist:  Lonni Hanson, MD   Electrophysiologist:  OLE ONEIDA HOLTS, MD    ***refresh  Discussed the use of AI scribe software for clinical note transcription with the patient, who gave verbal consent to proceed.   Patient Profile    Chief Complaint: ***  History of Present Illness: Gabriel Burton is a 73 y.o. male with PMH notable for cryptogenic stroke s/p ILR, HTN, LBBB, HLD, T2DM; seen today for OLE ONEIDA HOLTS, MD for routine electrophysiology followup.   He last saw Dr. HOLTS 05/2022 where ILR was implanted.   Since last being seen in our clinic the patient reports doing ***.  he denies chest pain, palpitations, dyspnea, PND, orthopnea, nausea, vomiting, dizziness, syncope, edema, weight gain, or early satiety.      Arrhythmia/Device History MDT ILR, imp 2023    ROS:  Please see the history of present illness. All other systems are reviewed and otherwise negative.    Physical Exam    VS:  There were no vitals taken for this visit. BMI: There is no height or weight on file to calculate BMI.      Wt Readings from Last 3 Encounters:  02/13/24 211 lb 6.4 oz (95.9 kg)  01/04/24 214 lb 12.8 oz (97.4 kg)  11/14/23 217 lb (98.4 kg)     GEN- The patient is well appearing, alert and oriented x 3 today.   Lungs- Clear to ausculation bilaterally, normal work of breathing.  Heart- {Blank single:19197::Regular,Irregularly irregular} rate and rhythm, no murmurs, rubs or gallops Extremities- {EDEMA LEVEL:28147::No} peripheral edema, warm, dry Skin-  *** device pocket well-healed, no tethering   9/2 Remote Device interrogation  reviewed by myself:  Battery good T-wave oversensing noted, unable to program around  Studies Reviewed   Previous EP, cardiology notes.    EKG is not ordered. Personal review  of EKG from 01/04/2024 shows:  SR w 1st deg HB, LAD, LBBB PR 244, QRS 150        TTE, 01/05/2022  1. Left ventricular ejection fraction, by estimation, is 50 %. The left ventricle has low normal function. The left ventricle demonstrates regional wall motion abnormalities (septal wall motion abnormality noted in the setting of bundle branch block). Left ventricular diastolic parameters are consistent with Grade I diastolic dysfunction (impaired relaxation).   2. Right ventricular systolic function is normal. The right ventricular size is normal.   3. Left atrial size was mildly dilated.   4. The mitral valve is normal in structure. No evidence of mitral valve regurgitation. No evidence of mitral stenosis.   5. The aortic valve is tricuspid. Aortic valve regurgitation is not visualized. No aortic stenosis is present.   6. The inferior vena cava is normal in size with greater than 50% respiratory variability, suggesting right atrial pressure of 3 mmHg.    Assessment and Plan     #) cryptogenic stroke #) ILR in situ   #) ***   {Are you ordering a CV Procedure (e.g. stress test, cath, DCCV, TEE, etc)?   Press F2        :[id number]}   Current medicines are reviewed at length with the patient today.   The patient {ACTIONS; HAS/DOES NOT HAVE:19233} concerns regarding his medicines.  The following changes were made today:  {NONE DEFAULTED:18576}  Labs/ tests ordered today include: ***  No orders of the defined types were placed in this encounter.    Disposition: Follow up with {EPMDS:28135::EP Team} or EP APP {EPFOLLOW UP:28173}   Signed, Chantal Needle, NP  03/18/24  8:20 AM  Electrophysiology [facility]

## 2024-03-19 ENCOUNTER — Ambulatory Visit (INDEPENDENT_AMBULATORY_CARE_PROVIDER_SITE_OTHER): Admitting: Nurse Practitioner

## 2024-03-19 ENCOUNTER — Encounter: Payer: Self-pay | Admitting: Nurse Practitioner

## 2024-03-19 VITALS — BP 136/82 | HR 74 | Temp 98.1°F | Resp 16 | Ht 74.0 in | Wt 214.8 lb

## 2024-03-19 DIAGNOSIS — R319 Hematuria, unspecified: Secondary | ICD-10-CM

## 2024-03-19 DIAGNOSIS — R350 Frequency of micturition: Secondary | ICD-10-CM | POA: Diagnosis not present

## 2024-03-19 DIAGNOSIS — N390 Urinary tract infection, site not specified: Secondary | ICD-10-CM | POA: Diagnosis not present

## 2024-03-19 DIAGNOSIS — R300 Dysuria: Secondary | ICD-10-CM | POA: Diagnosis not present

## 2024-03-19 DIAGNOSIS — N1832 Chronic kidney disease, stage 3b: Secondary | ICD-10-CM

## 2024-03-19 LAB — CUP PACEART REMOTE DEVICE CHECK: Date Time Interrogation Session: 20250830231236

## 2024-03-19 LAB — POCT URINALYSIS DIPSTICK
Bilirubin, UA: NEGATIVE
Glucose, UA: NEGATIVE
Leukocytes, UA: NEGATIVE
Nitrite, UA: NEGATIVE
Protein, UA: NEGATIVE
Spec Grav, UA: 1.01 (ref 1.010–1.025)
Urobilinogen, UA: 0.2 U/dL
pH, UA: 6 (ref 5.0–8.0)

## 2024-03-19 MED ORDER — CIPROFLOXACIN HCL 500 MG PO TABS: 500.0000 mg | ORAL_TABLET | Freq: Two times a day (BID) | ORAL | 0 refills | Status: AC

## 2024-03-19 NOTE — Progress Notes (Signed)
 [facility] [address] [place], [place] [address]  Internal MEDICINE  Office Visit Note  Patient Name: Gabriel Burton  [id number]  [id number]  Date of Service: 03/19/2024  Chief Complaint  Patient presents with   Acute Visit    uti     HPI Jamal presents for an acute sick visit for possible UTI --onset of symptoms was about a week ago, he reports having to urinate more frequently, and reports that there is a foul odor to his urine. He reports burning with urination sometimes and reports urgency as well. He also reports some back pain and flank pain.       Current Medication:  Outpatient Encounter Medications as of 03/19/2024  Medication Sig   ciprofloxacin  (CIPRO ) 500 MG tablet Take 1 tablet (500 mg total) by mouth 2 (two) times daily for 7 days. Take with food.   amLODipine  (NORVASC ) 10 MG tablet Take 1 tablet (10 mg total) by mouth daily.   aspirin  EC 81 MG tablet Take 81 mg by mouth daily.    atorvastatin  (LIPITOR) 10 MG tablet Take 1 tablet (10 mg total) by mouth at bedtime.   carvedilol  (COREG ) 12.5 MG tablet TAKE 1 & 1/2 (ONE & ONE-HALF) TABLETS BY MOUTH TWICE DAILY WITH MEALS   Cholecalciferol (VITAMIN D3) 5000 units TABS Take 5,000 Units by mouth daily.    CINNAMON PO Take by mouth daily.    clopidogrel  (PLAVIX ) 75 MG tablet Take 1 tablet (75 mg total) by mouth daily.   fluticasone  (FLONASE ) 50 MCG/ACT nasal spray Place 2 sprays into both nostrils daily.   gabapentin  (NEURONTIN ) 300 MG capsule Take 1 tablet by mouth in the morning and 2 tablets by mouth in the evening every day.   insulin  glargine, 1 Unit Dial, (TOUJEO  SOLOSTAR) 300 UNIT/ML Solostar Pen INJECT 40 UNITS SUBCUTANEOUSLY ONCE DAILY, MAY INCREASE TO 45 UNITS AS DIRECTED   nitroGLYCERIN  (NITROSTAT ) 0.4 MG SL tablet DISSOLVE ONE TABLET UNDER THE TONGUE EVERY 5 MINUTES AS NEEDED FOR CHEST PAIN.  DO NOT EXCEED A TOTAL OF 3 DOSES IN 15 MINUTES   olmesartan  (BENICAR ) 20 MG tablet Take 1 tablet (20  mg total) by mouth daily.   pantoprazole  (PROTONIX ) 40 MG tablet Take 1 tablet (40 mg total) by mouth daily.   Semaglutide , 1 MG/DOSE, 4 MG/3ML SOPN Inject 1 mg as directed once a week.   spironolactone  (ALDACTONE ) 25 MG tablet Take 1 tablet (25 mg total) by mouth daily.   tadalafil  (CIALIS ) 20 MG tablet Take 1 tablet (20 mg total) by mouth daily as needed for erectile dysfunction.   traMADol  (ULTRAM ) 50 MG tablet Take 50 mg by mouth.   TRUEPLUS 5-BEVEL PEN NEEDLES 32G X 4 MM MISC USE  TWICE DAILY AS DIRECTED   vitamin C (ASCORBIC ACID) 500 MG tablet Take 500 mg by mouth daily.    No facility-administered encounter medications on file as of 03/19/2024.      Medical History: Past Medical History:  Diagnosis Date   Diabetes mellitus without complication (HCC)    Diastolic dysfunction    a. 09/2018 Echo: EF 63%, diast dysfxn. Mild conc LVH. Mild TR.   History of stress test    a. 09/2018 ETT: Ex time 3:50. BP 207/90. Inconclusive 2/2 LBBB.  Stopped 2/2 claudication; b. 12/2018 Lexi MV: fixed septal/apical defect - felt to be artifact 2/2 LBBB. Very mild cor Ca2+ and Ao atherosclerosis. Low-mod risk study. EF 43%.   Hyperlipidemia    Hypertension  a. 08/2020 Renal artery duplex: No evidence of RAS.   Left bundle branch block    Stroke (HCC)      Vital Signs: BP 136/82   Pulse 74   Temp 98.1 F (36.7 C)   Resp 16   Ht 6' 2 (1.88 m)   Wt 214 lb 12.8 oz (97.4 kg)   SpO2 97%   BMI 27.58 kg/m    Review of Systems  Physical Exam    Assessment/Plan:   General Counseling: Cayden verbalizes understanding of the findings of todays visit and agrees with plan of treatment. I have discussed any further diagnostic evaluation that may be needed or ordered today. We also reviewed his medications today. he has been encouraged to call the office with any questions or concerns that should arise related to todays visit.    Counseling:    Orders Placed This Encounter  Procedures    CULTURE, URINE COMPREHENSIVE   POCT Urinalysis Dipstick    Meds ordered this encounter  Medications   ciprofloxacin  (CIPRO ) 500 MG tablet    Sig: Take 1 tablet (500 mg total) by mouth 2 (two) times daily for 7 days. Take with food.    Dispense:  14 tablet    Refill:  0    Fill new script today    No follow-ups on file.  [place] Controlled Substance Database was reviewed by me for overdose risk score (ORS)  Time spent:*** Minutes Time spent with patient included reviewing progress notes, labs, imaging studies, and discussing plan for follow up.   This patient was seen by Mardy Maxin, FNP-C in collaboration with Dr. Sigrid Bathe as a part of collaborative care agreement.  Kurtis Anastasia R. Maxin, MSN, FNP-C Internal Medicine

## 2024-03-20 ENCOUNTER — Encounter: Payer: Self-pay | Admitting: Nurse Practitioner

## 2024-03-22 ENCOUNTER — Ambulatory Visit: Payer: Self-pay | Admitting: Cardiology

## 2024-03-22 LAB — CULTURE, URINE COMPREHENSIVE

## 2024-03-25 NOTE — Progress Notes (Signed)
 Remote Loop Recorder Transmission

## 2024-04-14 NOTE — Progress Notes (Signed)
 Remote Loop Recorder Transmission

## 2024-04-16 ENCOUNTER — Ambulatory Visit (INDEPENDENT_AMBULATORY_CARE_PROVIDER_SITE_OTHER)

## 2024-04-16 DIAGNOSIS — I639 Cerebral infarction, unspecified: Secondary | ICD-10-CM | POA: Diagnosis not present

## 2024-04-17 ENCOUNTER — Ambulatory Visit: Payer: Self-pay | Admitting: Cardiology

## 2024-04-17 LAB — CUP PACEART REMOTE DEVICE CHECK: Date Time Interrogation Session: 20250930234212

## 2024-04-18 ENCOUNTER — Ambulatory Visit: Admitting: Cardiology

## 2024-04-18 NOTE — Progress Notes (Deleted)
     Electrophysiology Clinic Note    Date:  04/18/2024  Patient ID:  Gabriel Burton Nov 28, 1950, MRN [id number] PCP:  Liana Fish, NP  Cardiologist:  Lonni Hanson, MD  Electrophysiologist:  OLE ONEIDA HOLTS, MD    ***refresh  Discussed the use of AI scribe software for clinical note transcription with the patient, who gave verbal consent to proceed.   Patient Profile    Chief Complaint: ***  History of Present Illness: Gabriel Burton is a 73 y.o. male with PMH notable for cryptogenic stroke s/p ILR, HTN, HLD, T2DM ; seen today for OLE ONEIDA HOLTS, MD for routine electrophysiology followup.      Since last being seen in our clinic the patient reports doing ***.  he denies chest pain, palpitations, dyspnea, PND, orthopnea, nausea, vomiting, dizziness, syncope, edema, weight gain, or early satiety.      Arrhythmia/Device History MDT ILR, imp 2023    ROS:  Please see the history of present illness. All other systems are reviewed and otherwise negative.    Physical Exam    VS:  There were no vitals taken for this visit. BMI: There is no height or weight on file to calculate BMI.           Wt Readings from Last 3 Encounters:  03/19/24 214 lb 12.8 oz (97.4 kg)  02/13/24 211 lb 6.4 oz (95.9 kg)  01/04/24 214 lb 12.8 oz (97.4 kg)     GEN- The patient is well appearing, alert and oriented x 3 today.   Lungs- Clear to ausculation bilaterally, normal work of breathing.  Heart- {Blank single:19197::Regular,Irregularly irregular} rate and rhythm, no murmurs, rubs or gallops Extremities- {EDEMA LEVEL:28147::No} peripheral edema, warm, dry Skin-  *** device pocket well-healed, no tethering   *** Brief check performed without iterative lead testing/measurements   Device interrogation done today and reviewed by myself:  Battery *** Lead thresholds, impedence, sensing stable *** *** episodes *** changes made today   Studies Reviewed    Previous EP, cardiology notes.    EKG {ACTION; IS/IS WNU:78978602} ordered. Personal review of EKG from {Blank single:19197::today,***} shows:  ***             Assessment and Plan     #) ***   #) ***   {Are you ordering a CV Procedure (e.g. stress test, cath, DCCV, TEE, etc)?   Press F2        :[id number]}   Current medicines are reviewed at length with the patient today.   The patient {ACTIONS; HAS/DOES NOT HAVE:19233} concerns regarding his medicines.  The following changes were made today:  {NONE DEFAULTED:18576}  Labs/ tests ordered today include: *** No orders of the defined types were placed in this encounter.    Disposition: Follow up with {EPMDS:28135::EP Team} or EP APP {EPFOLLOW UP:28173}   Signed, Chantal Needle, NP  04/18/24  1:24 PM  Electrophysiology [facility]

## 2024-04-21 NOTE — Progress Notes (Signed)
 Remote Loop Recorder Transmission

## 2024-05-12 ENCOUNTER — Other Ambulatory Visit: Payer: Self-pay

## 2024-05-12 ENCOUNTER — Telehealth: Payer: Self-pay

## 2024-05-12 DIAGNOSIS — R350 Frequency of micturition: Secondary | ICD-10-CM

## 2024-05-12 DIAGNOSIS — R10A3 Flank pain, bilateral: Secondary | ICD-10-CM

## 2024-05-12 MED ORDER — TAMSULOSIN HCL 0.4 MG PO CAPS: 0.4000 mg | ORAL_CAPSULE | Freq: Every day | ORAL | 0 refills | Status: DC

## 2024-05-15 NOTE — Telephone Encounter (Signed)
 done

## 2024-05-17 ENCOUNTER — Ambulatory Visit

## 2024-05-17 DIAGNOSIS — I639 Cerebral infarction, unspecified: Secondary | ICD-10-CM | POA: Diagnosis not present

## 2024-05-19 LAB — CUP PACEART REMOTE DEVICE CHECK: Date Time Interrogation Session: 20251031231417

## 2024-05-20 ENCOUNTER — Ambulatory Visit
Admission: RE | Admit: 2024-05-20 | Discharge: 2024-05-20 | Disposition: A | Discharge status: Home / Self Care | Source: Ambulatory Visit | Attending: Urology | Admitting: Urology

## 2024-05-20 DIAGNOSIS — N2889 Other specified disorders of kidney and ureter: Secondary | ICD-10-CM | POA: Insufficient documentation

## 2024-05-21 ENCOUNTER — Ambulatory Visit: Payer: Self-pay | Admitting: Cardiology

## 2024-05-21 NOTE — Progress Notes (Signed)
 Remote Loop Recorder Transmission

## 2024-05-28 ENCOUNTER — Ambulatory Visit: Payer: BC Managed Care – PPO | Admitting: Physician Assistant

## 2024-05-28 DIAGNOSIS — N2889 Other specified disorders of kidney and ureter: Secondary | ICD-10-CM

## 2024-05-29 ENCOUNTER — Ambulatory Visit: Admitting: Cardiology

## 2024-06-03 ENCOUNTER — Ambulatory Visit: Admitting: Physician Assistant

## 2024-06-03 VITALS — BP 131/61 | HR 79 | Ht 74.0 in | Wt 250.0 lb

## 2024-06-03 DIAGNOSIS — N3289 Other specified disorders of bladder: Secondary | ICD-10-CM | POA: Diagnosis not present

## 2024-06-03 DIAGNOSIS — N2889 Other specified disorders of kidney and ureter: Secondary | ICD-10-CM | POA: Diagnosis not present

## 2024-06-03 LAB — MICROSCOPIC EXAMINATION: Epithelial Cells (non renal): >10 /HPF — AB (ref 0–10)

## 2024-06-03 LAB — URINALYSIS, COMPLETE
Bilirubin, UA: NEGATIVE
Glucose, UA: NEGATIVE
Ketones, UA: NEGATIVE
Nitrite, UA: NEGATIVE
Protein,UA: NEGATIVE
Specific Gravity, UA: 1.02 (ref 1.005–1.030)
Urobilinogen, Ur: 0.2 mg/dL (ref 0.2–1.0)
pH, UA: 6 (ref 5.0–7.5)

## 2024-06-03 NOTE — Patient Instructions (Signed)

## 2024-06-03 NOTE — Progress Notes (Signed)
 06/03/2024 2:50 PM   Gabriel Burton [birth year] [id number]  CC: Chief Complaint  Patient presents with   Follow-up   HPI: Gabriel Burton is a 73 y.o. 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   Renal ultrasound dated 05/20/2024 shows a simple left renal cyst measuring up to 3 cm with no recommended follow-up.  There was an incidental finding of 3 small bladder wall masses measuring up to 1.4 cm.  Today he reports he was recently treated for a UTI and then developed gross hematuria about 2 weeks ago.  His PCP put him on Flomax  for a possible kidney stone.  He describes voiding symptoms including dysuria and urgency.  He has a remote, <15 pack-year smoking history.  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  Urine culture dated 03/19/2024 was negative.  In-office UA today positive for trace intact blood and trace leukocytes; urine microscopy with 11-30 WBCs/HPF, 3-10 RBCs/HPF, >10 epithelial cells/hpf, and moderate bacteria.   PMH: Past Medical History:  Diagnosis Date   Diabetes mellitus without complication (HCC)    Diastolic dysfunction    a. 09/2018 Echo: EF 63%, diast dysfxn. Mild conc LVH. Mild TR.   History of stress test    a. 09/2018 ETT: Ex time 3:50. BP 207/90. Inconclusive 2/2 LBBB.  Stopped 2/2 claudication; b. 12/2018 Lexi MV: fixed septal/apical defect - felt to be artifact 2/2 LBBB. Very mild cor Ca2+ and Ao atherosclerosis. Low-mod risk study. EF 43%.   Hyperlipidemia    Hypertension    a. 08/2020 Renal artery duplex: No evidence of RAS.   Left bundle branch block    Stroke [facility])     Surgical History: Past Surgical History:  Procedure Laterality Date   LOOP RECORDER IMPLANT  06/14/2022    Home Medications:  Allergies as of 06/03/2024       Reactions   Lisinopril  Swelling         Medication List        Accurate as of June 03, 2024  2:50 PM. If you have any questions, ask your nurse or doctor.          amLODipine  10 MG tablet Commonly known as: NORVASC  Take 1 tablet (10 mg total) by mouth daily.   ascorbic acid 500 MG tablet Commonly known as: VITAMIN C Take 500 mg by mouth daily.   aspirin  EC 81 MG tablet Take 81 mg by mouth daily.   atorvastatin  10 MG tablet Commonly known as: LIPITOR Take 1 tablet (10 mg total) by mouth at bedtime.   carvedilol  12.5 MG tablet Commonly known as: COREG  TAKE 1 & 1/2 (ONE & ONE-HALF) TABLETS BY MOUTH TWICE DAILY WITH MEALS   CINNAMON PO Take by mouth daily.   clopidogrel  75 MG tablet Commonly known as: PLAVIX  Take 1 tablet (75 mg total) by mouth daily.   fluticasone  50 MCG/ACT nasal spray Commonly known as: FLONASE  Place 2 sprays into both nostrils daily.   gabapentin  300 MG capsule Commonly known as: NEURONTIN  Take 1 tablet by mouth in the morning and 2 tablets by mouth in the evening every day.   nitroGLYCERIN  0.4 MG SL tablet Commonly known as: NITROSTAT  DISSOLVE ONE TABLET UNDER THE TONGUE EVERY 5 MINUTES AS NEEDED FOR CHEST PAIN.  DO NOT EXCEED A TOTAL OF 3 DOSES IN 15  MINUTES   olmesartan  20 MG tablet Commonly known as: BENICAR  Take 1 tablet (20 mg total) by mouth daily.   pantoprazole  40 MG tablet Commonly known as: PROTONIX  Take 1 tablet (40 mg total) by mouth daily.   Semaglutide  (1 MG/DOSE) 4 MG/3ML Sopn Inject 1 mg as directed once a week.   spironolactone  25 MG tablet Commonly known as: ALDACTONE  Take 1 tablet (25 mg total) by mouth daily.   tadalafil  20 MG tablet Commonly known as: CIALIS  Take 1 tablet (20 mg total) by mouth daily as needed for erectile dysfunction.   tamsulosin  0.4 MG Caps capsule Commonly known as: FLOMAX  Take 1 capsule (0.4 mg total) by mouth daily.   Toujeo  SoloStar 300 UNIT/ML Solostar Pen Generic drug: insulin  glargine (1 Unit Dial) INJECT 40  UNITS SUBCUTANEOUSLY ONCE DAILY, MAY INCREASE TO 45 UNITS AS DIRECTED   traMADol  50 MG tablet Commonly known as: ULTRAM  Take 50 mg by mouth.   TRUEplus 5-Bevel Pen Needles 32G X 4 MM Misc Generic drug: Insulin  Pen Needle USE  TWICE DAILY AS DIRECTED   Vitamin D3 125 MCG (5000 UT) Tabs Take 5,000 Units by mouth daily.        Allergies:  Allergies  Allergen Reactions   Lisinopril  Swelling    Family History: Family History  Problem Relation Age of Onset   Hypertension Mother    Hypertension Father    Heart attack Brother 80    Social History:   reports that he quit smoking about 25 years ago. His smoking use included cigarettes. He started smoking about 33 years ago. He has a 3.2 pack-year smoking history. He has never used smokeless tobacco. He reports that he does not drink alcohol and does not use drugs.  Physical Exam: BP 131/61 (BP Location: Left Arm, Patient Position: Sitting, Cuff Size: Large)   Pulse 79   Ht 6' 2 (1.88 m)   Wt 250 lb (113.4 kg)   SpO2 98%   BMI 32.10 kg/m   Constitutional:  Alert and oriented, no acute distress, nontoxic appearing HEENT: [place], AT Cardiovascular: No clubbing, cyanosis, or edema Respiratory: Normal respiratory effort, no increased work of breathing Skin: No rashes, bruises or suspicious lesions Neurologic: Grossly intact, no focal deficits, moving all 4 extremities Psychiatric: Normal mood and affect  Laboratory Data: Results for orders placed or performed in visit on 06/03/24  Microscopic Examination   Collection Time: 06/03/24  2:59 PM   Urine  Result Value Ref Range   WBC, UA 11-30 (A) 0 - 5 /hpf   RBC, Urine 3-10 (A) 0 - 2 /hpf   Epithelial Cells (non renal) >10 (A) 0 - 10 /hpf   Mucus, UA Present (A) Not Estab.   Bacteria, UA Moderate (A) None seen/Few  Urinalysis, Complete   Collection Time: 06/03/24  2:59 PM  Result Value Ref Range   Specific Gravity, UA 1.020 1.005 - 1.030   pH, UA 6.0 5.0 - 7.5   Color, UA  Yellow Yellow   Appearance Ur Clear Clear   Leukocytes,UA Trace (A) Negative   Protein,UA Negative Negative/Trace   Glucose, UA Negative Negative   Ketones, UA Negative Negative   RBC, UA Trace (A) Negative   Bilirubin, UA Negative Negative   Urobilinogen, Ur 0.2 0.2 - 1.0 mg/dL   Nitrite, UA Negative Negative   Microscopic Examination See below:    Pertinent Imaging: Results for orders placed during the hospital encounter of 05/20/24  Ultrasound renal complete  Narrative EXAM: US  Retroperitoneum  Complete, Renal.  CLINICAL HISTORY: Renal mass, frequent and difficulty urinating, hematuria.  TECHNIQUE: Real-time ultrasound of the retroperitoneum (complete) with image documentation.  COMPARISON: US  Renal 05/01/2023.  FINDINGS:  RIGHT KIDNEY: Measures 11.0 x 4.8 x 5.0 cm. No hydronephrosis, renal stone, or mass visualized.  LEFT KIDNEY: Measures 11.1 x 4.3 x 5.0 cm. There is a 2.9 x 3.0 x 2.7 cm anechoic area in the left kidney favored as a cyst. No hydronephrosis or renal stone visualized.  BLADDER: There are 3 small bladder wall masses with the largest measuring 1.4 x 0.6 x 1.2 cm. Bilateral ureteral jets are present.  IMPRESSION: 1. Left renal simple cyst measuring 2.9 x 3.0 x 2.7 cm; benign appearance. No imaging follow-up recommended. 2. Three small bladder wall masses, largest 1.4 x 0.6 x 1.2 cm; recommend urology consultation and cystoscopic evaluation.  Electronically signed by: Greig Pique MD 05/25/2024 11:08 PM EST RP Workstation: [id number]  I personally reviewed the images referenced above and note a simple left renal cyst and multiple bladder masses.  Assessment & Plan:   1. Renal mass (Primary) Simple left renal cyst, no further workup or monitoring indicated.  2. Bladder mass Incidental multiple bladder masses with a recent history of irritative voiding symptoms, negative urine culture, and gross hematuria.  Overall high suspicion for bladder  cancer.  I recommended cystoscopy with Dr. Twylla for further evaluation.  Will defer CT urogram, as I anticipate bilateral retrograde pyelograms in the OR if cystoscopy is positive. - Urinalysis, Complete - CULTURE, URINE COMPREHENSIVE   Return in about 2 weeks (around 06/17/2024) for Cystoscopy with Dr. Twylla.  Lucie Hones, PA-C  [facility] [place] [address] [place], [place] [address] [phone]

## 2024-06-06 ENCOUNTER — Ambulatory Visit: Payer: Self-pay | Admitting: Physician Assistant

## 2024-06-06 ENCOUNTER — Other Ambulatory Visit: Payer: Self-pay | Admitting: Nurse Practitioner

## 2024-06-06 DIAGNOSIS — E1140 Type 2 diabetes mellitus with diabetic neuropathy, unspecified: Secondary | ICD-10-CM

## 2024-06-06 LAB — CULTURE, URINE COMPREHENSIVE

## 2024-06-11 ENCOUNTER — Ambulatory Visit: Admitting: Cardiology

## 2024-06-13 ENCOUNTER — Other Ambulatory Visit: Payer: Self-pay | Admitting: Internal Medicine

## 2024-06-13 DIAGNOSIS — I10 Essential (primary) hypertension: Secondary | ICD-10-CM

## 2024-06-13 DIAGNOSIS — E782 Mixed hyperlipidemia: Secondary | ICD-10-CM

## 2024-06-13 DIAGNOSIS — E785 Hyperlipidemia, unspecified: Secondary | ICD-10-CM

## 2024-06-16 ENCOUNTER — Other Ambulatory Visit: Payer: Self-pay | Admitting: Nurse Practitioner

## 2024-06-17 ENCOUNTER — Ambulatory Visit

## 2024-06-17 ENCOUNTER — Ambulatory Visit: Attending: Cardiology | Admitting: Cardiology

## 2024-06-17 VITALS — BP 124/70 | HR 78 | Ht 74.0 in | Wt 214.4 lb

## 2024-06-17 DIAGNOSIS — I639 Cerebral infarction, unspecified: Secondary | ICD-10-CM

## 2024-06-17 DIAGNOSIS — Z95818 Presence of other cardiac implants and grafts: Secondary | ICD-10-CM | POA: Diagnosis not present

## 2024-06-17 NOTE — Progress Notes (Signed)
 Electrophysiology Clinic Note    Date:  06/17/2024  Patient ID:  Gabriel Burton, Gabriel Burton [birth year], MRN [id number] PCP:  Liana Fish, NP  Cardiologist:  Lonni Hanson, MD  Electrophysiologist:  OLE ONEIDA HOLTS, MD  Electrophysiology APP:  Donabelle Molden, NP    Discussed the use of AI scribe software for clinical note transcription with the patient, who gave verbal consent to proceed.   Patient Profile    Chief Complaint: ILR follow-up  History of Present Illness: Gabriel Burton is a 73 y.o. male with PMH notable for CVA s/p ILR, LBBB, HTN, HLD, T2DM; seen today for OLE ONEIDA HOLTS, MD for routine electrophysiology followup.   He had CVA 12/2021, last saw Dr. Holts 05/2022 at which time an ILR was implanted to monitor for atrial arrhythmia as cause for CVA.  He has continued to follow-up regularly with Dr. Hanson and general cardiology APP.   He has no complaints currently. Denies chest pain, chest pressure, palpitations.    Arrhythmia/Device History MDT ILR, imp 2023    ROS:  Please see the history of present illness. All other systems are reviewed and otherwise negative.    Physical Exam    VS:  BP 124/70 (BP Location: Left Arm, Patient Position: Sitting, Cuff Size: Normal)   Pulse 78   Ht 6' 2 (1.88 m)   Wt 214 lb 6.4 oz (97.3 kg)   SpO2 97%   BMI 27.53 kg/m  BMI: Body mass index is 27.53 kg/m.           Wt Readings from Last 3 Encounters:  06/17/24 214 lb 6.4 oz (97.3 kg)  06/03/24 250 lb (113.4 kg)  03/19/24 214 lb 12.8 oz (97.4 kg)     GEN- The patient is well appearing, alert and oriented x 3 today.   Lungs- Clear to ausculation bilaterally, normal work of breathing.  Heart- Regular rate and rhythm, no murmurs, rubs or gallops Extremities- No peripheral edema, warm, dry   06/17/2024 remote ILR reviewed by myself:  Battery good No AFib, AT PVC 0.1%   Studies Reviewed   Previous EP, cardiology notes.    EKG is ordered. Personal  review of EKG from today shows:    EKG Interpretation Date/Time:  Tuesday June 17 2024 15:14:13 EST Ventricular Rate:  73 PR Interval:  238 QRS Duration:  156 QT Interval:  428 QTC Calculation: 471 R Axis:   -53  Text Interpretation: Sinus rhythm with 1st degree A-V block Left axis deviation Left bundle branch block Confirmed by Crue Otero [phone]) on 06/17/2024 3:20:06 PM    Long term monitor, 02/03/2022   The patient was monitored for 12 days, 18 hours.   The predominant rhythm was sinus with an average rate of 79 bpm (range 53-123 bpm in sinus).  Underlying first-degree AV block and intraventricular conduction delay noted.   There were rare PACs and PVCs.   Three supraventricular runs were observed, lasting up to 6 beats with a maximum rate of 160 bpm.   No sustained arrhythmia or prolonged pause was observed.   Patient triggered events corresponded to sinus rhythm.   Predominantly sinus rhythm with rare PACs and PVCs as well as a few brief runs of PSVT.  TTE, 01/05/2022  1. Left ventricular ejection fraction, by estimation, is 50 %. The left ventricle has low normal function. The left ventricle demonstrates regional wall motion abnormalities (septal wall motion abnormality noted in the setting of bundle branch block). Left ventricular diastolic  parameters are consistent with Grade I diastolic dysfunction (impaired relaxation).   2. Right ventricular systolic function is normal. The right ventricular size is normal.   3. Left atrial size was mildly dilated.   4. The mitral valve is normal in structure. No evidence of mitral valve regurgitation. No evidence of mitral stenosis.   5. The aortic valve is tricuspid. Aortic valve regurgitation is not visualized. No aortic stenosis is present.   6. The inferior vena cava is normal in size with greater than 50% respiratory variability, suggesting right atrial pressure of 3 mmHg.    Assessment and Plan     #) cryptogenic stroke #)  ILR No atrial arrhythmias noted on ILR Continue monitor remote monitoring        Current medicines are reviewed at length with the patient today.   The patient does not have concerns regarding his medicines.  The following changes were made today:  none  Labs/ tests ordered today include:  Orders Placed This Encounter  Procedures   EKG 12-Lead     Disposition: Follow up with EP Team or EP APP PRN , continue remote monitoring   Signed, Yaeko Fazekas, NP  06/17/24  3:46 PM  Electrophysiology [facility]

## 2024-06-17 NOTE — Patient Instructions (Signed)
 Medication Instructions:  Your physician recommends that you continue on your current medications as directed. Please refer to the Current Medication list given to you today.   *If you need a refill on your cardiac medications before your next appointment, please call your pharmacy*  Lab Work: No labs ordered today  If you have labs (blood work) drawn today and your tests are completely normal, you will receive your results only by: MyChart Message (if you have MyChart) OR A paper copy in the mail If you have any lab test that is abnormal or we need to change your treatment, we will call you to review the results.  Testing/Procedures: No test ordered today   Follow-Up: At [facility], you and your health needs are our priority.  As part of our continuing mission to provide you with exceptional heart care, our providers are all part of one team.  This team includes your primary Cardiologist (physician) and Advanced Practice Providers or APPs (Physician Assistants and Nurse Practitioners) who all work together to provide you with the care you need, when you need it.  Your next appointment:   AS NEEDED

## 2024-06-18 LAB — CUP PACEART REMOTE DEVICE CHECK: Date Time Interrogation Session: 20251201232327

## 2024-06-20 NOTE — Progress Notes (Signed)
 Remote Loop Recorder Transmission

## 2024-06-25 ENCOUNTER — Ambulatory Visit: Admitting: Urology

## 2024-06-25 VITALS — BP 130/79 | HR 74 | Ht 73.0 in | Wt 214.0 lb

## 2024-06-25 DIAGNOSIS — D494 Neoplasm of unspecified behavior of bladder: Secondary | ICD-10-CM | POA: Diagnosis not present

## 2024-06-25 DIAGNOSIS — N3289 Other specified disorders of bladder: Secondary | ICD-10-CM

## 2024-06-25 DIAGNOSIS — N528 Other male erectile dysfunction: Secondary | ICD-10-CM

## 2024-06-25 LAB — URINALYSIS, COMPLETE
Bilirubin, UA: NEGATIVE
Glucose, UA: NEGATIVE
Ketones, UA: NEGATIVE
Nitrite, UA: NEGATIVE
Protein,UA: NEGATIVE
Specific Gravity, UA: 1.015 (ref 1.005–1.030)
Urobilinogen, Ur: 0.2 mg/dL (ref 0.2–1.0)
pH, UA: 6.5 (ref 5.0–7.5)

## 2024-06-25 LAB — MICROSCOPIC EXAMINATION

## 2024-06-25 MED ORDER — TADALAFIL 20 MG PO TABS: 20.0000 mg | ORAL_TABLET | Freq: Every day | ORAL | 11 refills | Status: DC | PRN

## 2024-06-25 MED ORDER — SULFAMETHOXAZOLE-TRIMETHOPRIM 800-160 MG PO TABS
1.0000 | ORAL_TABLET | Freq: Every day | ORAL | Status: DC
  Administered 2024-06-25: 1 via ORAL

## 2024-06-25 MED ORDER — DUTASTERIDE 0.5 MG PO CAPS: 0.5000 mg | ORAL_CAPSULE | Freq: Every day | ORAL | 3 refills | Status: DC

## 2024-06-25 NOTE — Progress Notes (Unsigned)
 06/25/2024 1:50 PM   GWENDOLYN NISHI 1951/01/15 [id number]  Referring provider: Liana Fish, NP [address] [place],  [place] [address]  Chief Complaint  Patient presents with   Cysto    HPI: Gabriel Burton is a 73 y.o. male found on cystoscopy today to have multifocal papillary bladder tumors.  To be scheduled for TURBT   PMH: Past Medical History:  Diagnosis Date   Diabetes mellitus without complication (HCC)    Diastolic dysfunction    a. 09/2018 Echo: EF 63%, diast dysfxn. Mild conc LVH. Mild TR.   History of stress test    a. 09/2018 ETT: Ex time 3:50. BP 207/90. Inconclusive 2/2 LBBB.  Stopped 2/2 claudication; b. 12/2018 Lexi MV: fixed septal/apical defect - felt to be artifact 2/2 LBBB. Very mild cor Ca2+ and Ao atherosclerosis. Low-mod risk study. EF 43%.   Hyperlipidemia    Hypertension    a. 08/2020 Renal artery duplex: No evidence of RAS.   Left bundle branch block    Stroke [facility])     Surgical History: Past Surgical History:  Procedure Laterality Date   LOOP RECORDER IMPLANT  06/14/2022    Home Medications:  Allergies as of 06/25/2024       Reactions   Lisinopril  Swelling        Medication List        Accurate as of June 25, 2024  1:50 PM. If you have any questions, ask your nurse or doctor.          STOP taking these medications    tadalafil  20 MG tablet Commonly known as: CIALIS        TAKE these medications    amLODipine  10 MG tablet Commonly known as: NORVASC  Take 1 tablet (10 mg total) by mouth daily.   ascorbic acid 500 MG tablet Commonly known as: VITAMIN C Take 500 mg by mouth daily.   aspirin  EC 81 MG tablet Take 81 mg by mouth daily.   atorvastatin  10 MG tablet Commonly known as: LIPITOR Take 1 tablet (10 mg total) by mouth at bedtime.   carvedilol  12.5 MG tablet Commonly known as: COREG  Take 1 tablet (12.5 mg total) by mouth 2 (two) times daily with a meal.   CINNAMON PO Take by mouth daily.    clopidogrel  75 MG tablet Commonly known as: PLAVIX  Take 1 tablet (75 mg total) by mouth daily.   fluticasone  50 MCG/ACT nasal spray Commonly known as: FLONASE  Place 2 sprays into both nostrils daily.   gabapentin  300 MG capsule Commonly known as: NEURONTIN  Take 1 tablet by mouth in the morning and 2 tablets by mouth in the evening every day.   nitroGLYCERIN  0.4 MG SL tablet Commonly known as: NITROSTAT  DISSOLVE ONE TABLET UNDER THE TONGUE EVERY 5 MINUTES AS NEEDED FOR CHEST PAIN.  DO NOT EXCEED A TOTAL OF 3 DOSES IN 15 MINUTES   olmesartan  20 MG tablet Commonly known as: BENICAR  Take 1 tablet (20 mg total) by mouth daily.   pantoprazole  40 MG tablet Commonly known as: PROTONIX  Take 1 tablet (40 mg total) by mouth daily.   Semaglutide  (1 MG/DOSE) 4 MG/3ML Sopn Inject 1 mg as directed once a week.   spironolactone  25 MG tablet Commonly known as: ALDACTONE  Take 1 tablet (25 mg total) by mouth daily.   tamsulosin  0.4 MG Caps capsule Commonly known as: FLOMAX  Take 1 capsule by mouth once daily   Toujeo  SoloStar 300 UNIT/ML Solostar Pen Generic drug: insulin  glargine (1 Unit Dial)  INJECT 40 UNITS SUBCUTANEOUSLY ONCE DAILY. MAY INCREASE TO 45 UNITS AS DIRECTED.   traMADol  50 MG tablet Commonly known as: ULTRAM  Take 50 mg by mouth.   TRUEplus 5-Bevel Pen Needles 32G X 4 MM Misc Generic drug: Insulin  Pen Needle USE  TWICE DAILY AS DIRECTED   Vitamin D3 125 MCG (5000 UT) Tabs Take 5,000 Units by mouth daily.        Allergies:  Allergies  Allergen Reactions   Lisinopril  Swelling    Family History: Family History  Problem Relation Age of Onset   Hypertension Mother    Hypertension Father    Heart attack Brother 84    Social History:  reports that he quit smoking about 25 years ago. His smoking use included cigarettes. He started smoking about 33 years ago. He has a 3.2 pack-year smoking history. He has never used smokeless tobacco. He reports that he does not  drink alcohol and does not use drugs.   Physical Exam: BP 130/79   Pulse 74   Ht 6' 1 (1.854 m)   Wt 214 lb (97.1 kg)   BMI 28.23 kg/m   Constitutional:  Alert, No acute distress. HEENT: [place] AT Respiratory: Normal respiratory effort, no increased work of breathing. Psychiatric: Normal mood and affect.   Assessment & Plan:    1.  Multifocal bladder tumors Scheduled for TURBT. The procedure has been discussed in detail as outlined in today's cystoscopy note All questions were answered and he desires to proceed Will plan post resection intravesical gemcitabine    Fredericka Bottcher C Quint Chestnut, MD  [facility] [place] [address] [place], [place] [address] [phone]

## 2024-06-25 NOTE — Progress Notes (Unsigned)
° °  06/25/24  CC:  Chief Complaint  Patient presents with   Cysto    HPI: Gabriel Burton 06/03/2024 and RUS incidentally showed 3 small bladder masses measuring up to 1.4 cm.  UA today dipstick 1+ blood/trace leukocytes/nitrite negative; microscopy 11-30 WBC/3-10 RBC  Blood pressure 130/79, pulse 74, height 6' 1 (1.854 m), weight 214 lb (97.1 kg).  Cystoscopy Procedure Note  Patient identification was confirmed, informed consent was obtained, and patient was prepped using Betadine solution.  Lidocaine jelly was administered per urethral meatus.     Pre-Procedure: - Inspection reveals a normal caliber urethral meatus.  Procedure: The flexible cystoscope was introduced without difficulty - No urethral strictures/lesions are present. 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

## 2024-06-25 NOTE — H&P (View-Only) (Signed)
 "  06/25/2024 1:50 PM   Gabriel Burton 03-01-1951 [id number]  Referring provider: Liana Fish, NP [address] [place],  [place] [address]  Chief Complaint  Patient presents with   Cysto    HPI: Gabriel Burton is a 72 y.o. male found on cystoscopy today to have multifocal papillary bladder tumors.  To be scheduled for TURBT   PMH: Past Medical History:  Diagnosis Date   Diabetes mellitus without complication (HCC)    Diastolic dysfunction    a. 09/2018 Echo: EF 63%, diast dysfxn. Mild conc LVH. Mild TR.   History of stress test    a. 09/2018 ETT: Ex time 3:50. BP 207/90. Inconclusive 2/2 LBBB.  Stopped 2/2 claudication; b. 12/2018 Lexi MV: fixed septal/apical defect - felt to be artifact 2/2 LBBB. Very mild cor Ca2+ and Ao atherosclerosis. Low-mod risk study. EF 43%.   Hyperlipidemia    Hypertension    a. 08/2020 Renal artery duplex: No evidence of RAS.   Left bundle branch block    Stroke [facility])     Surgical History: Past Surgical History:  Procedure Laterality Date   LOOP RECORDER IMPLANT  06/14/2022    Home Medications:  Allergies as of 06/25/2024       Reactions   Lisinopril  Swelling        Medication List        Accurate as of June 25, 2024  1:50 PM. If you have any questions, ask your nurse or doctor.          STOP taking these medications    tadalafil  20 MG tablet Commonly known as: CIALIS        TAKE these medications    amLODipine  10 MG tablet Commonly known as: NORVASC  Take 1 tablet (10 mg total) by mouth daily.   ascorbic acid 500 MG tablet Commonly known as: VITAMIN C Take 500 mg by mouth daily.   aspirin  EC 81 MG tablet Take 81 mg by mouth daily.   atorvastatin  10 MG tablet Commonly known as: LIPITOR Take 1 tablet (10 mg total) by mouth at bedtime.   carvedilol  12.5 MG tablet Commonly known as: COREG  Take 1 tablet (12.5 mg total) by mouth 2 (two) times daily with a meal.   CINNAMON PO Take by mouth daily.    clopidogrel  75 MG tablet Commonly known as: PLAVIX  Take 1 tablet (75 mg total) by mouth daily.   fluticasone  50 MCG/ACT nasal spray Commonly known as: FLONASE  Place 2 sprays into both nostrils daily.   gabapentin  300 MG capsule Commonly known as: NEURONTIN  Take 1 tablet by mouth in the morning and 2 tablets by mouth in the evening every day.   nitroGLYCERIN  0.4 MG SL tablet Commonly known as: NITROSTAT  DISSOLVE ONE TABLET UNDER THE TONGUE EVERY 5 MINUTES AS NEEDED FOR CHEST PAIN.  DO NOT EXCEED A TOTAL OF 3 DOSES IN 15 MINUTES   olmesartan  20 MG tablet Commonly known as: BENICAR  Take 1 tablet (20 mg total) by mouth daily.   pantoprazole  40 MG tablet Commonly known as: PROTONIX  Take 1 tablet (40 mg total) by mouth daily.   Semaglutide  (1 MG/DOSE) 4 MG/3ML Sopn Inject 1 mg as directed once a week.   spironolactone  25 MG tablet Commonly known as: ALDACTONE  Take 1 tablet (25 mg total) by mouth daily.   tamsulosin  0.4 MG Caps capsule Commonly known as: FLOMAX  Take 1 capsule by mouth once daily   Toujeo  SoloStar 300 UNIT/ML Solostar Pen Generic drug: insulin  glargine (1 Unit  Dial) INJECT 40 UNITS SUBCUTANEOUSLY ONCE DAILY. MAY INCREASE TO 45 UNITS AS DIRECTED.   traMADol  50 MG tablet Commonly known as: ULTRAM  Take 50 mg by mouth.   TRUEplus 5-Bevel Pen Needles 32G X 4 MM Misc Generic drug: Insulin  Pen Needle USE  TWICE DAILY AS DIRECTED   Vitamin D3 125 MCG (5000 UT) Tabs Take 5,000 Units by mouth daily.        Allergies:  Allergies  Allergen Reactions   Lisinopril  Swelling    Family History: Family History  Problem Relation Age of Onset   Hypertension Mother    Hypertension Father    Heart attack Brother 1    Social History:  reports that he quit smoking about 25 years ago. His smoking use included cigarettes. He started smoking about 33 years ago. He has a 3.2 pack-year smoking history. He has never used smokeless tobacco. He reports that he does not  drink alcohol and does not use drugs.   Physical Exam: BP 130/79   Pulse 74   Ht 6' 1 (1.854 m)   Wt 214 lb (97.1 kg)   BMI 28.23 kg/m   Constitutional:  Alert, No acute distress. HEENT: [place] AT Respiratory: Normal respiratory effort, no increased work of breathing. Psychiatric: Normal mood and affect.   Assessment & Plan:    1.  Multifocal bladder tumors Scheduled for TURBT and bilateral retrograde pyelogram The procedure has been discussed in detail as outlined in today's cystoscopy note All questions were answered and he desires to proceed Will plan post resection intravesical gemcitabine     Gabriel JAYSON Barba, MD  [facility] [address] [place], [place] [address] [phone] "

## 2024-06-30 ENCOUNTER — Telehealth: Payer: Self-pay

## 2024-06-30 ENCOUNTER — Other Ambulatory Visit: Payer: Self-pay

## 2024-06-30 ENCOUNTER — Telehealth (HOSPITAL_BASED_OUTPATIENT_CLINIC_OR_DEPARTMENT_OTHER): Payer: Self-pay

## 2024-06-30 DIAGNOSIS — D494 Neoplasm of unspecified behavior of bladder: Secondary | ICD-10-CM

## 2024-06-30 NOTE — Progress Notes (Signed)
°  Phone Number: [phone] for Surgical Coordinator Fax Number: [phone]  REQUEST FOR SURGICAL CLEARANCE      Date: 06/30/2024  [facility]  Surgeon: Dr. Glendia Barba, MD     Date of Surgery: 07/24/2024  Operation: Transurethral Resection of Bladder Tumor with Intravesical Instillation of Gemcitabine and Bilateral Retrograde Pyelograms   Anesthesia Type: General   Diagnosis: Bladder Tumor  Patient Requires:   Cardiac / Vascular Clearance : Yes  Reason: Would like for patient to hold Plavix  and ASA prior to surgery.   Risk Assessment:    Low   []       Moderate   []     High   []           This patient is optimized for surgery  YES []       NO   []    I recommend further assessment/workup prior to surgery. YES []      NO  []   Appointment scheduled for: _______________________   Further recommendations: ____________________________________     Physician Signature:__________________________________   Printed Name: ________________________________________   Date: _________________

## 2024-06-30 NOTE — Telephone Encounter (Signed)
° °  Pre-operative Risk Assessment    Patient Name: Gabriel Burton  DOB: [birth year] MRN: [id number]   Date of last office visit: 06/17/24 with Beecher Date of next office visit: NA  Request for Surgical Clearance    Procedure:  Transurethral resection of bladder tumor with intravesical installation of gemcitabine and bilateral retrograde pyelograms  Date of Surgery:  Clearance 07/24/24                                 Surgeon:  Dr. Twylla Surgeon's Group or Practice Name:  [facility]  Phone number:  [phone] Fax number:  [phone]   Type of Clearance Requested:   - Medical  - Pharmacy:  Hold Aspirin  and Clopidogrel  (Plavix ) not indicated   Type of Anesthesia:  General    Additional requests/questions:    Bonney Augustin JONETTA Delores   06/30/2024, 2:52 PM

## 2024-06-30 NOTE — Telephone Encounter (Signed)
° °  Name: Gabriel Burton  DOB: 1951/06/20  MRN: [id number]  Primary Cardiologist: Lonni Hanson, MD   Preoperative team, please contact this patient and set up a phone call appointment for further preoperative risk assessment. Please obtain consent and complete medication review. Thank you for your help.  I confirm that guidance regarding antiplatelet and oral anticoagulation therapy has been completed and, if necessary, noted below.  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 Surgeon should contact that office for recommendations.   I also confirmed the patient resides in the state of [place] . As per [facility] Medical Board telemedicine laws, the patient must reside in the state in which the provider is licensed.   Lamarr Satterfield, NP 06/30/2024, 2:59 PM [facility]

## 2024-06-30 NOTE — Telephone Encounter (Signed)
 Per Dr. Twylla, Patient is to be scheduled for Transurethral Resection of Bladder Tumor with Intravesical Instillation of Gemcitabine and Bilateral Retrograde Pyelograms   [name] was contacted and possible surgical dates were discussed, Thursday January 8th, 2026 was agreed upon for surgery.  Patient was instructed that Dr. Twylla will require them to provide a pre-op UA & CX prior to surgery. This was ordered and scheduled drop off appointment was made for 07/14/2024.    Patient was directed to call [phone] between 1-3pm the day before surgery to find out surgical arrival time.  Instructions were given not to eat or drink from midnight on the night before surgery and have a driver for the day of surgery. On the surgery day patient was instructed to enter through the Medical Mall entrance of [facility] report the Same Day Surgery desk.   Pre-Admit Testing will be in contact via phone to set up an interview with the anesthesia team to review your history and medications prior to surgery.   Reminder of this information was sent via MyChart to the patient.

## 2024-06-30 NOTE — Telephone Encounter (Signed)
 Appointment scheduled for 07/19/23 @ 9:40am. Med req and consent are complete. Call patient at [phone].

## 2024-06-30 NOTE — Progress Notes (Signed)
 Surgical Physician Order Form  [facility] [place]  Dr. Glendia Barba, MD  * Scheduling expectation : Next Available  *Length of Case:   *Clearance needed: yes  *Anticoagulation Instructions: Hold all anticoagulants  *Aspirin  Instructions: Hold Aspirin  and Plavix   *Post-op visit Date/Instructions:  1-2 week with pathology review  *Diagnosis: Bladder Tumor  *Procedure:  TURBT 2-5cm (47764); bilateral retrograde pyelogram   Additional orders: Gemcitabine 2000mg  bladder instillation  -Admit type: OUTpatient  -Anesthesia: General  -VTE Prophylaxis Standing Order SCD's       Other:   -Standing Lab Orders Per Anesthesia    Lab other: Urine PCR Vik[place]  -Standing Test orders EKG/Chest x-ray per Anesthesia       Test other:   - Medications:  Ancef 2gm IV  -Other orders:  N/A

## 2024-06-30 NOTE — Telephone Encounter (Signed)
°  Patient Consent for Virtual Visit         Gabriel Burton has provided verbal consent on 06/30/2024 for a virtual visit (video or telephone).  Appointment scheduled for 07/19/23 @ 9:40am. Med req and consent are complete. Call patient at [phone].    CONSENT FOR VIRTUAL VISIT FOR:  Gabriel Burton  By participating in this virtual visit I agree to the following:  I hereby voluntarily request, consent and authorize [facility] and its employed or contracted physicians, physician assistants, nurse practitioners or other licensed health care professionals (the Practitioner), to provide me with telemedicine health care services (the Services) as deemed necessary by the treating Practitioner. I acknowledge and consent to receive the Services by the Practitioner via telemedicine. I understand that the telemedicine visit will involve communicating with the Practitioner through live audiovisual communication technology and the disclosure of certain medical information by electronic transmission. I acknowledge that I have been given the opportunity to request an in-person assessment or other available alternative prior to the telemedicine visit and am voluntarily participating in the telemedicine visit.  I understand that I have the right to withhold or withdraw my consent to the use of telemedicine in the course of my care at any time, without affecting my right to future care or treatment, and that the Practitioner or I may terminate the telemedicine visit at any time. I understand that I have the right to inspect all information obtained and/or recorded in the course of the telemedicine visit and may receive copies of available information for a reasonable fee.  I understand that some of the potential risks of receiving the Services via telemedicine include:  Delay or interruption in medical evaluation due to technological equipment failure or disruption; Information transmitted may not  be sufficient (e.g. poor resolution of images) to allow for appropriate medical decision making by the Practitioner; and/or  In rare instances, security protocols could fail, causing a breach of personal health information.  Furthermore, I acknowledge that it is my responsibility to provide information about my medical history, conditions and care that is complete and accurate to the best of my ability. I acknowledge that Practitioner's advice, recommendations, and/or decision may be based on factors not within their control, such as incomplete or inaccurate data provided by me or distortions of diagnostic images or specimens that may result from electronic transmissions. I understand that the practice of medicine is not an exact science and that Practitioner makes no warranties or guarantees regarding treatment outcomes. I acknowledge that a copy of this consent can be made available to me via my patient portal [facility] MyChart), or I can request a printed copy by calling the office of [facility].    I understand that my insurance will be billed for this visit.   I have read or had this consent read to me. I understand the contents of this consent, which adequately explains the benefits and risks of the Services being provided via telemedicine.  I have been provided ample opportunity to ask questions regarding this consent and the Services and have had my questions answered to my satisfaction. I give my informed consent for the services to be provided through the use of telemedicine in my medical care

## 2024-06-30 NOTE — Progress Notes (Signed)
° °  [facility] Surgical Posting Form  Surgery Date: Date: 07/24/2024  Surgeon: Dr. Glendia Barba, MD  Inpt ( No  )   Outpt (Yes)   Obs ( No  )   Diagnosis: D49.4 Bladder Tumor  -CPT: 47764, [address], [phone]  Surgery: Transurethral Resection of Bladder Tumor with Intravesical Instillation of Gemcitabine and Bilateral Retrograde Pyelograms  Stop Anticoagulations: Yes, Hold Plavix  and ASA  Cardiac/Medical/Pulmonary Clearance needed: Yes  Clearance needed from Dr: [facility]  Clearance request sent on: Date: 06/30/2024  *Orders entered into EPIC  Date: 06/30/2024   *Case booked in EPIC  Date: 06/30/2024  *Notified pt of Surgery: Date: 06/30/2024  PRE-OP UA & CX: yes, will obtain in clinic on 07/14/2024  *Placed into Prior Authorization Work [place] Date: 06/30/2024  Assistant/laser/rep:No

## 2024-07-03 NOTE — Progress Notes (Signed)
°  Phone Number: [phone] for Surgical Coordinator Fax Number: [phone]  REQUEST FOR SURGICAL CLEARANCE      Date: Date: 07/03/2024  Faxed to: [facility]  Surgeon: Dr. Glendia Barba, MD     Date of Surgery: 07/24/2024  Operation: Transurethral Resection of Bladder Tumor with Intravesical Instillation of Gemcitabine and Bilateral Retrograde Pyelograms   Anesthesia Type: General   Diagnosis: Bladder Tumor  Patient Requires:   Medical Clearance : Yes  Reason: Patient will need to hold Plavix  and ASA prior to surgery.   Risk Assessment:    Low   []       Moderate   []     High   []           This patient is optimized for surgery  YES []       NO   []    I recommend further assessment/workup prior to surgery. YES []      NO  []   Appointment scheduled for: _______________________   Further recommendations: ____________________________________     Physician Signature:__________________________________   Printed Name: ________________________________________   Date: _________________

## 2024-07-04 ENCOUNTER — Inpatient Hospital Stay
Admission: RE | Admit: 2024-07-04 | Discharge: 2024-07-04 | Disposition: A | Discharge status: Home / Self Care | Source: Ambulatory Visit

## 2024-07-04 ENCOUNTER — Ambulatory Visit: Payer: Self-pay | Admitting: Cardiology

## 2024-07-04 DIAGNOSIS — Z01812 Encounter for preprocedural laboratory examination: Secondary | ICD-10-CM

## 2024-07-04 DIAGNOSIS — D494 Neoplasm of unspecified behavior of bladder: Secondary | ICD-10-CM

## 2024-07-04 DIAGNOSIS — E1165 Type 2 diabetes mellitus with hyperglycemia: Secondary | ICD-10-CM

## 2024-07-04 NOTE — Patient Instructions (Addendum)
 Your procedure is scheduled on: 07/24/24 - Thursday Report to the Registration Desk on the 1st floor of the Medical Mall. To find out your arrival time, please call [phone] between 1PM - 3PM on: 07/23/24 - Wednesday If your arrival time is 6:00 am, do not arrive before that time as the Medical Mall entrance doors do not open until 6:00 am.  REMEMBER: Instructions that are not followed completely may result in serious medical risk, up to and including death; or upon the discretion of your surgeon and anesthesiologist your surgery may need to be rescheduled.  Do not eat food or drink any liquids after midnight the night before surgery.  No gum chewing or hard candies.  One week prior to surgery: Stop Anti-inflammatories (NSAIDS) such as Advil, Aleve , Ibuprofen, Motrin, Naproxen , Naprosyn  and Aspirin  based products such as Excedrin, Goody's Powder, BC Powder.You may take Tylenol  if needed for pain up until the day of surgery.  Stop ANY OVER THE COUNTER supplements until after surgery.  We have sent over a clearance to [facility] to hold your Plavix  and Aspirin . Once we hear back from their office we will communicate those parameters with you.    *Please hold your OZEMPIC  at least one week prior to surgery. Please do not take on Monday prior to your surgery.   TOUJEO  SOLOSTAR 3   ON THE DAY OF SURGERY ONLY TAKE THESE MEDICATIONS WITH SIPS OF WATER:  amLODipine  (NORVASC )  carvedilol  (COREG )  dutasteride  (AVODART )  gabapentin  (NEURONTIN )  pantoprazole  (PROTONIX )  tamsulosin  (FLOMAX )    No Alcohol for 24 hours before or after surgery.  No Smoking including e-cigarettes for 24 hours before surgery.  No chewable tobacco products for at least 6 hours before surgery.  No nicotine patches on the day of surgery.  Do not use any recreational drugs for at least a week (preferably 2 weeks) before your surgery.  Please be advised that the combination of cocaine and anesthesia may have  negative outcomes, up to and including death. If you test positive for cocaine, your surgery will be cancelled.  On the morning of surgery brush your teeth with toothpaste and water, you may rinse your mouth with mouthwash if you wish. Do not swallow any toothpaste or mouthwash.  Use CHG Soap or wipes as directed on instruction sheet.  Do not wear jewelry, make-up, hairpins, clips or nail polish.  For welded (permanent) jewelry: bracelets, anklets, waist bands, etc.  Please have this removed prior to surgery.  If it is not removed, there is a chance that hospital personnel will need to cut it off on the day of surgery.  Do not wear lotions, powders, or perfumes.   Do not shave body hair from the neck down 48 hours before surgery.  Contact lenses, hearing aids and dentures may not be worn into surgery.  Do not bring valuables to the hospital. [facility] is not responsible for any missing/lost belongings or valuables.   Total Shoulder Arthroplasty:  use Benzoyl Peroxide 5% Gel as directed on instruction sheet.  Bring your C-PAP to the hospital in case you may have to spend the night.   Notify your doctor if there is any change in your medical condition (cold, fever, infection).  Wear comfortable clothing (specific to your surgery type) to the hospital.  After surgery, you can help prevent lung complications by doing breathing exercises.  Take deep breaths and cough every 1-2 hours. Your doctor may order a device called an Facilities Manager to  help you take deep breaths. When coughing or sneezing, hold a pillow firmly against your incision with both hands. This is called splinting. Doing this helps protect your incision. It also decreases belly discomfort.  If you are being admitted to the hospital overnight, leave your suitcase in the car. After surgery it may be brought to your room.  In case of increased patient census, it may be necessary for you, the patient, to continue  your postoperative care in the Same Day Surgery department.  If you are being discharged the day of surgery, you will not be allowed to drive home. You will need a responsible individual to drive you home and stay with you for 24 hours after surgery.   If you are taking public transportation, you will need to have a responsible individual with you.  Please call the Pre-admissions Testing Dept. at [phone] if you have any questions about these instructions.  Surgery Visitation Policy:  Patients having surgery or a procedure may have two visitors.  Children under the age of 57 must have an adult with them who is not the patient.  Inpatient Visitation:    Visiting hours are 7 a.m. to 8 p.m. Up to four visitors are allowed at one time in a patient room. The visitors may rotate out with other people during the day.  One visitor age 11 or older may stay with the patient overnight and must be in the room by 8 p.m.   Merchandiser, Retail to address health-related social needs:  [email or web address]

## 2024-07-14 ENCOUNTER — Ambulatory Visit (INDEPENDENT_AMBULATORY_CARE_PROVIDER_SITE_OTHER): Admitting: Physician Assistant

## 2024-07-14 DIAGNOSIS — R300 Dysuria: Secondary | ICD-10-CM

## 2024-07-14 NOTE — Progress Notes (Signed)
 Patient presents to clinic for VIKOR urine collection. Urine was identified using 2 identifiers (Name/DOB). Urine was collected per VIKOR instructions and shipped via [employer].

## 2024-07-15 ENCOUNTER — Encounter: Payer: Self-pay | Admitting: Nurse Practitioner

## 2024-07-15 ENCOUNTER — Encounter
Admission: RE | Admit: 2024-07-15 | Discharge: 2024-07-15 | Disposition: A | Discharge status: Home / Self Care | Source: Ambulatory Visit | Attending: Urology | Admitting: Urology

## 2024-07-15 ENCOUNTER — Telehealth: Payer: Self-pay

## 2024-07-15 ENCOUNTER — Other Ambulatory Visit: Payer: Self-pay

## 2024-07-15 ENCOUNTER — Telehealth: Payer: Self-pay | Admitting: Nurse Practitioner

## 2024-07-15 ENCOUNTER — Ambulatory Visit (INDEPENDENT_AMBULATORY_CARE_PROVIDER_SITE_OTHER): Admitting: Nurse Practitioner

## 2024-07-15 VITALS — BP 140/82 | HR 79 | Temp 98.1°F | Resp 16 | Ht 74.0 in | Wt 222.0 lb

## 2024-07-15 VITALS — Ht 74.0 in | Wt 214.0 lb

## 2024-07-15 DIAGNOSIS — Z0001 Encounter for general adult medical examination with abnormal findings: Secondary | ICD-10-CM

## 2024-07-15 DIAGNOSIS — Z1211 Encounter for screening for malignant neoplasm of colon: Secondary | ICD-10-CM | POA: Diagnosis not present

## 2024-07-15 DIAGNOSIS — I447 Left bundle-branch block, unspecified: Secondary | ICD-10-CM

## 2024-07-15 DIAGNOSIS — E1169 Type 2 diabetes mellitus with other specified complication: Secondary | ICD-10-CM

## 2024-07-15 DIAGNOSIS — E1159 Type 2 diabetes mellitus with other circulatory complications: Secondary | ICD-10-CM | POA: Diagnosis not present

## 2024-07-15 DIAGNOSIS — N1831 Chronic kidney disease, stage 3a: Secondary | ICD-10-CM

## 2024-07-15 DIAGNOSIS — E785 Hyperlipidemia, unspecified: Secondary | ICD-10-CM | POA: Diagnosis not present

## 2024-07-15 DIAGNOSIS — Z1212 Encounter for screening for malignant neoplasm of rectum: Secondary | ICD-10-CM

## 2024-07-15 DIAGNOSIS — E1122 Type 2 diabetes mellitus with diabetic chronic kidney disease: Secondary | ICD-10-CM | POA: Diagnosis not present

## 2024-07-15 DIAGNOSIS — I499 Cardiac arrhythmia, unspecified: Secondary | ICD-10-CM

## 2024-07-15 DIAGNOSIS — I152 Hypertension secondary to endocrine disorders: Secondary | ICD-10-CM | POA: Diagnosis not present

## 2024-07-15 DIAGNOSIS — I10 Essential (primary) hypertension: Secondary | ICD-10-CM

## 2024-07-15 DIAGNOSIS — E1140 Type 2 diabetes mellitus with diabetic neuropathy, unspecified: Secondary | ICD-10-CM

## 2024-07-15 DIAGNOSIS — Z794 Long term (current) use of insulin: Secondary | ICD-10-CM

## 2024-07-15 HISTORY — DX: Neoplasm of unspecified behavior of bladder: D49.4

## 2024-07-15 HISTORY — DX: Gastro-esophageal reflux disease without esophagitis: K21.9

## 2024-07-15 HISTORY — DX: Unspecified osteoarthritis, unspecified site: M19.90

## 2024-07-15 MED ORDER — SPIRONOLACTONE 25 MG PO TABS: 25.0000 mg | ORAL_TABLET | Freq: Every day | ORAL | 1 refills | Status: AC

## 2024-07-15 MED ORDER — TOUJEO SOLOSTAR 300 UNIT/ML ~~LOC~~ SOPN: 45.0000 [IU] | PEN_INJECTOR | Freq: Every day | SUBCUTANEOUS | Status: DC

## 2024-07-15 MED ORDER — SEMAGLUTIDE (1 MG/DOSE) 4 MG/3ML ~~LOC~~ SOPN: 1.0000 mg | PEN_INJECTOR | SUBCUTANEOUS | 5 refills | Status: AC

## 2024-07-15 NOTE — Progress Notes (Signed)
 Medical clearance on chart from Alyssa Abernathy NP-Low Risk-Aspirin  and Plavix  ok to stop 5 days prior to surgery per clearance note-Last dose on 07-18-24

## 2024-07-15 NOTE — Telephone Encounter (Signed)
 Spoke with pt. Pt. Advised that we received surgical clearance. Patient is to hold Plavix  and ASA for 5 days prior to surgery. Patient verbalized understanding.

## 2024-07-15 NOTE — Telephone Encounter (Addendum)
 Received surgical clearance from [facility]. Gave to [facility]  Surgical clearance completed & faxed back to [facility]; [phone]. Scanned-Toni

## 2024-07-15 NOTE — Progress Notes (Signed)
 [facility] [address] [place], [place] [address]  Internal MEDICINE  Office Visit Note  Patient Name: Gabriel Burton  [id number]  [id number]  Date of Service: 07/15/2024  Chief Complaint  Patient presents with   Diabetes   Gastroesophageal Reflux   Hypertension   Hyperlipidemia   Medicare Wellness    HPI Jebadiah presents for a medicare annual wellness visit.  Well-appearing 73 y.o. male with diabetes, hypertension, CKD stage 3a, allergic rhinitis, hyperlipidemia, and history of stroke.  Routine CRC screening: due now, opt for cologuard  Eye exam: went to [facility] this year.  foot exam: done  Labs: recent labs done for upcoming surgery.  New or worsening pain: none  Other concerns: upcoming surgery for bladder cancer.      07/15/2024    2:50 PM 07/13/2023    8:54 AM 07/13/2022   11:13 AM  MMSE - Mini Mental State Exam  Orientation to time 5 5 5   Orientation to Place 5 5 5   Registration 3 3 3   Attention/ Calculation 5 5 5   Recall 3 3 3   Language- name 2 objects 2 2 2   Language- repeat 1 1 1   Language- follow 3 step command 3 3 3   Language- read & follow direction 1 1 1   Write a sentence 1 1 1   Copy design 1 1 1   Total score 30 30 30     Functional Status Survey: Is the patient deaf or have difficulty hearing?: No Does the patient have difficulty seeing, even when wearing glasses/contacts?: No Does the patient have difficulty concentrating, remembering, or making decisions?: No Does the patient have difficulty walking or climbing stairs?: No Does the patient have difficulty dressing or bathing?: No Does the patient have difficulty doing errands alone such as visiting a doctor's office or shopping?: No     04/27/2022    1:39 PM 07/13/2022   11:13 AM 03/26/2023    9:51 AM 07/13/2023    8:53 AM 07/15/2024    2:50 PM  Fall Risk  Falls in the past year? 0 0 0 0 0  Was there an injury with Fall? 0   0  0  0  Fall Risk Category  Calculator 0  0 0 0  Fall Risk Category (Retired) Low       (RETIRED) Patient Fall Risk Level Low fall risk       Patient at Risk for Falls Due to No Fall Risks  No Fall Risks No Fall Risks   Fall risk Follow up Falls evaluation completed   Falls evaluation completed Falls evaluation completed Falls evaluation completed     Data saved with a previous flowsheet row definition       07/15/2024    2:50 PM  Depression screen PHQ 2/9  Decreased Interest 0  Down, Depressed, Hopeless 0  PHQ - 2 Score 0        No data to display            Current Medication: Outpatient Encounter Medications as of 07/15/2024  Medication Sig   amLODipine  (NORVASC ) 10 MG tablet Take 1 tablet (10 mg total) by mouth daily.   aspirin  EC 81 MG tablet Take 81 mg by mouth daily.    atorvastatin  (LIPITOR) 10 MG tablet Take 1 tablet (10 mg total) by mouth at bedtime.   carvedilol  (COREG ) 12.5 MG tablet Take 1 tablet (12.5 mg total) by mouth 2 (two) times daily with a meal.   Cholecalciferol (VITAMIN  D3) 5000 units TABS Take 5,000 Units by mouth daily.    CINNAMON PO Take 1 capsule by mouth daily. (Patient not taking: Reported on 07/15/2024)   clopidogrel  (PLAVIX ) 75 MG tablet Take 1 tablet (75 mg total) by mouth daily.   dutasteride  (AVODART ) 0.5 MG capsule Take 0.5 mg by mouth daily.   fluticasone  (FLONASE ) 50 MCG/ACT nasal spray Place 2 sprays into both nostrils daily. (Patient taking differently: Place 2 sprays into both nostrils daily as needed for allergies.)   gabapentin  (NEURONTIN ) 300 MG capsule Take 1 tablet by mouth in the morning and 2 tablets by mouth in the evening every day.   insulin  glargine, 1 Unit Dial, (TOUJEO  SOLOSTAR) 300 UNIT/ML Solostar Pen Inject 45 Units into the skin daily.   nitroGLYCERIN  (NITROSTAT ) 0.4 MG SL tablet DISSOLVE ONE TABLET UNDER THE TONGUE EVERY 5 MINUTES AS NEEDED FOR CHEST PAIN.  DO NOT EXCEED A TOTAL OF 3 DOSES IN 15 MINUTES   olmesartan  (BENICAR ) 20 MG tablet Take  1 tablet (20 mg total) by mouth daily.   pantoprazole  (PROTONIX ) 40 MG tablet Take 1 tablet (40 mg total) by mouth daily.   Semaglutide , 1 MG/DOSE, 4 MG/3ML SOPN Inject 1 mg as directed once a week.   spironolactone  (ALDACTONE ) 25 MG tablet Take 1 tablet (25 mg total) by mouth daily.   tamsulosin  (FLOMAX ) 0.4 MG CAPS capsule Take 1 capsule by mouth once daily   traMADol  (ULTRAM ) 50 MG tablet Take 50 mg by mouth every 8 (eight) hours as needed for moderate pain (pain score 4-6).   TRUEPLUS 5-BEVEL PEN NEEDLES 32G X 4 MM MISC USE  TWICE DAILY AS DIRECTED   vitamin C (ASCORBIC ACID) 500 MG tablet Take 500 mg by mouth daily.    [DISCONTINUED] Semaglutide , 1 MG/DOSE, 4 MG/3ML SOPN Inject 1 mg as directed once a week.   [DISCONTINUED] spironolactone  (ALDACTONE ) 25 MG tablet Take 1 tablet (25 mg total) by mouth daily.   [DISCONTINUED] TOUJEO  SOLOSTAR 300 UNIT/ML Solostar Pen INJECT 40 UNITS SUBCUTANEOUSLY ONCE DAILY. MAY INCREASE TO 45 UNITS AS DIRECTED.   Facility-Administered Encounter Medications as of 07/15/2024  Medication   sulfamethoxazole -trimethoprim  (BACTRIM  DS) 800-160 MG per tablet 1 tablet    Surgical History: Past Surgical History:  Procedure Laterality Date   COLONOSCOPY     LOOP RECORDER IMPLANT  06/14/2022    Medical History: Past Medical History:  Diagnosis Date   Arthritis    Bladder tumor    Diabetes mellitus without complication (HCC)    Diastolic dysfunction    a. 09/2018 Echo: EF 63%, diast dysfxn. Mild conc LVH. Mild TR.   GERD (gastroesophageal reflux disease)    History of stress test    a. 09/2018 ETT: Ex time 3:50. BP 207/90. Inconclusive 2/2 LBBB.  Stopped 2/2 claudication; b. 12/2018 Lexi MV: fixed septal/apical defect - felt to be artifact 2/2 LBBB. Very mild cor Ca2+ and Ao atherosclerosis. Low-mod risk study. EF 43%.   Hyperlipidemia    Hypertension    a. 08/2020 Renal artery duplex: No evidence of RAS.   Left bundle branch block    Stroke [facility])      Family History: Family History  Problem Relation Age of Onset   Hypertension Mother    Hypertension Father    Heart attack Brother 58    Social History   Socioeconomic History   Marital status: Widowed    Spouse name: Not on file   Number of children: Not on file   Years  of education: Not on file   Highest education level: Not on file  Occupational History   Not on file  Tobacco Use   Smoking status: Former    Current packs/day: 0.00    Average packs/day: 0.4 packs/day for 8.0 years (3.2 ttl pk-yrs)    Types: Cigarettes    Start date: 53    Quit date: 2000    Years since quitting: 26.0   Smokeless tobacco: Never  Vaping Use   Vaping status: Never Used  Substance and Sexual Activity   Alcohol use: Never   Drug use: Never   Sexual activity: Not on file  Other Topics Concern   Not on file  Social History Narrative   Not on file   Social Drivers of Health   Tobacco Use: Medium Risk (07/15/2024)   Patient History    Smoking Tobacco Use: Former    Smokeless Tobacco Use: Never    Passive Exposure: Not on Actuary Strain: Low Risk  (01/29/2024)   Received from [facility]   Overall Financial Resource Strain (CARDIA)    Difficulty of Paying Living Expenses: Not very hard  Food Insecurity: No Food Insecurity (01/29/2024)   Received from [facility]   Epic    Within the past 12 months, you worried that your food would run out before you got the money to buy more.: Never true    Within the past 12 months, the food you bought just didn't last and you didn't have money to get more.: Never true  Transportation Needs: No Transportation Needs (01/29/2024)   Received from [facility] - Transportation    In the past 12 months, has lack of transportation kept you from medical appointments or from getting medications?: No    Lack of Transportation (Non-Medical): No  Physical Activity: Not on  file  Stress: Not on file  Social Connections: Not on file  Intimate Partner Violence: Not on file  Depression (PHQ2-9): Low Risk (07/15/2024)   Depression (PHQ2-9)    PHQ-2 Score: 0  Alcohol Screen: Low Risk (04/27/2022)   Alcohol Screen    Last Alcohol Screening Score (AUDIT): 0  Housing: Low Risk  (01/29/2024)   Received from [facility]   Epic    In the last 12 months, was there a time when you were not able to pay the mortgage or rent on time?: No    In the past 12 months, how many times have you moved where you were living?: 0    At any time in the past 12 months, were you homeless or living in a shelter (including now)?: No  Utilities: Not At Risk (01/29/2024)   Received from [facility]   Epic    In the past 12 months has the electric, gas, oil, or water company threatened to shut off services in your home?: No  Health Literacy: Not on file      Review of Systems  Constitutional:  Negative for activity change, appetite change, chills, fatigue, fever and unexpected weight change.  HENT: Negative.  Negative for congestion, ear pain, rhinorrhea, sore throat and trouble swallowing.   Eyes: Negative.   Respiratory: Negative.  Negative for cough, chest tightness, shortness of breath and wheezing.   Cardiovascular: Negative.  Negative for chest pain and palpitations.  Gastrointestinal:  Negative for abdominal distention, abdominal pain, blood in stool, constipation, diarrhea, nausea and vomiting.  Endocrine: Negative.  Genitourinary: Negative.  Negative for difficulty urinating, dysuria, frequency, hematuria and urgency.  Musculoskeletal: Negative.  Negative for arthralgias, back pain, joint swelling, myalgias and neck pain.  Skin: Negative.  Negative for rash and wound.  Allergic/Immunologic: Negative.  Negative for immunocompromised state.  Neurological: Negative.  Negative for dizziness, seizures, numbness and headaches.  Hematological:  Negative.   Psychiatric/Behavioral: Negative.  Negative for behavioral problems, self-injury and suicidal ideas. The patient is not nervous/anxious.     Vital Signs: BP (!) 140/82   Pulse 79   Temp 98.1 F (36.7 C)   Resp 16   Ht 6' 2 (1.88 m)   Wt 222 lb (100.7 kg)   SpO2 97%   BMI 28.50 kg/m    Physical Exam Vitals reviewed.  Constitutional:      General: He is awake. He is not in acute distress.    Appearance: Normal appearance. He is well-developed, well-groomed and overweight. He is not ill-appearing or diaphoretic.  HENT:     Head: Normocephalic and atraumatic.     Right Ear: Tympanic membrane, ear canal and external ear normal.     Left Ear: Tympanic membrane, ear canal and external ear normal.     Nose: Nose normal. No congestion or rhinorrhea.     Mouth/Throat:     Lips: Pink.     Mouth: Mucous membranes are moist.     Pharynx: Oropharynx is clear. Uvula midline. No oropharyngeal exudate or posterior oropharyngeal erythema.  Eyes:     General: Lids are normal. Vision grossly intact. Gaze aligned appropriately. No scleral icterus.       Right eye: No discharge.        Left eye: No discharge.     Extraocular Movements: Extraocular movements intact.     Conjunctiva/sclera: Conjunctivae normal.     Pupils: Pupils are equal, round, and reactive to light.     Funduscopic exam:    Right eye: Red reflex present.        Left eye: Red reflex present. Neck:     Thyroid: No thyromegaly.     Vascular: No JVD.     Trachea: Trachea and phonation normal. No tracheal deviation.  Cardiovascular:     Rate and Rhythm: Normal rate and regular rhythm.     Pulses: Normal pulses.     Heart sounds: Normal heart sounds, S1 normal and S2 normal. No murmur heard.    No friction rub. No gallop.  Pulmonary:     Effort: Pulmonary effort is normal. No accessory muscle usage or respiratory distress.     Breath sounds: Normal breath sounds and air entry. No stridor. No wheezing or rales.   Chest:     Chest wall: No tenderness.  Abdominal:     General: Bowel sounds are normal. There is no distension.     Palpations: Abdomen is soft. There is no shifting dullness, fluid wave, mass or pulsatile mass.     Tenderness: There is no abdominal tenderness. There is no guarding or rebound.  Musculoskeletal:        General: No tenderness or deformity. Normal range of motion.     Cervical back: Normal range of motion and neck supple.     Right lower leg: No edema.     Left lower leg: No edema.  Feet:     Right foot:     Protective Sensation: 6 sites tested.  6 sites sensed.     Skin integrity: No skin breakdown, erythema, warmth, callus or  dry skin.     Toenail Condition: Right toenails are abnormally thick and long. Fungal disease present.    Left foot:     Protective Sensation: 6 sites tested.  6 sites sensed.     Skin integrity: No erythema, warmth, callus or dry skin.     Toenail Condition: Left toenails are abnormally thick and long. Fungal disease present. Lymphadenopathy:     Cervical: No cervical adenopathy.  Skin:    General: Skin is warm and dry.     Capillary Refill: Capillary refill takes less than 2 seconds.     Coloration: Skin is not pale.     Findings: No erythema or rash.  Neurological:     Mental Status: He is alert and oriented to person, place, and time.     Cranial Nerves: No cranial nerve deficit.     Motor: No abnormal muscle tone.     Coordination: Coordination normal.     Gait: Gait normal.     Deep Tendon Reflexes: Reflexes are normal and symmetric.  Psychiatric:        Mood and Affect: Mood normal.        Behavior: Behavior normal. Behavior is cooperative.        Thought Content: Thought content normal.        Judgment: Judgment normal.        Assessment/Plan: 1. Encounter for Medicare annual examination with abnormal findings (Primary) Age-appropriate preventive screenings and vaccinations discussed. Routine labs for health maintenance  recently done for preop testing, results pending. PHM updated.    2. Type 2 diabetes mellitus with stage 3a chronic kidney disease, with long-term current use of insulin  (HCC) Urine sent to lab. Continue medications as prescribed.  - Urine Microalbumin w/creat. ratio - Semaglutide , 1 MG/DOSE, 4 MG/3ML SOPN; Inject 1 mg as directed once a week.  Dispense: 3 mL; Refill: 5 - insulin  glargine, 1 Unit Dial, (TOUJEO  SOLOSTAR) 300 UNIT/ML Solostar Pen; Inject 45 Units into the skin daily.  3. Hypertension associated with diabetes (HCC) Continue medications as prescribed.  - spironolactone  (ALDACTONE ) 25 MG tablet; Take 1 tablet (25 mg total) by mouth daily.  Dispense: 90 tablet; Refill: 1  4. Hyperlipidemia associated with type 2 diabetes mellitus (HCC) Continue atorvastatin  as prescribed.   5. Screening for colorectal cancer Cologuard test ordered  - Cologuard    General Counseling: Ralphie verbalizes understanding of the findings of todays visit and agrees with plan of treatment. I have discussed any further diagnostic evaluation that may be needed or ordered today. We also reviewed his medications today. he has been encouraged to call the office with any questions or concerns that should arise related to todays visit.    Orders Placed This Encounter  Procedures   Urine Microalbumin w/creat. ratio   Cologuard    Meds ordered this encounter  Medications   Semaglutide , 1 MG/DOSE, 4 MG/3ML SOPN    Sig: Inject 1 mg as directed once a week.    Dispense:  3 mL    Refill:  5    For future refills   insulin  glargine, 1 Unit Dial, (TOUJEO  SOLOSTAR) 300 UNIT/ML Solostar Pen    Sig: Inject 45 Units into the skin daily.   spironolactone  (ALDACTONE ) 25 MG tablet    Sig: Take 1 tablet (25 mg total) by mouth daily.    Dispense:  90 tablet    Refill:  1    Return in about 4 months (around 11/13/2024) for F/U, Recheck A1C, Jaonna Word  PCP.   Total time spent:30 Minutes Time spent includes review  of chart, medications, test results, and follow up plan with the patient.   [place] Controlled Substance Database was reviewed by me.  This patient was seen by Mardy Maxin, FNP-C in collaboration with Dr. Sigrid Bathe as a part of collaborative care agreement.  Konni Kesinger R. Maxin, MSN, FNP-C Internal medicine

## 2024-07-15 NOTE — Patient Instructions (Addendum)
 Your procedure is scheduled on: Thursday 07/24/24 Report to the Registration Desk on the 1st floor of the [facility]. To find out your arrival time, please call [phone] between 1PM - 3PM on: Wednesday 07/23/24 If your arrival time is 6:00 am, do not arrive before that time as the [facility] entrance doors do not open until 6:00 am.  REMEMBER: Instructions that are not followed completely may result in serious medical risk, up to and including death; or upon the discretion of your surgeon and anesthesiologist your surgery may need to be rescheduled.  Do not eat food or drink fluids after midnight the night before surgery.  No gum chewing or hard candies.   One week prior to surgery: Stop Anti-inflammatories (NSAIDS) such as Advil, Aleve , Ibuprofen, Motrin, Naproxen , Naprosyn  and Aspirin  based products such as Excedrin, Goody's Powder, BC Powder. Stop ANY OVER THE COUNTER supplements until after surgery. vitamin C (ASCORBIC ACID) 500 MG  CINNAMON PO  Cholecalciferol (VITAMIN D3)   You may however, continue to take Tylenol  if needed for pain up until the day of surgery.  We have sent over a clearance to [facility] to hold your Plavix  and Aspirin . Once we hear back from their office we will communicate those parameters with you. You have an appointment with cardiology January 2, they will advise when to stop your Plavix  and Aspirin .   Stop Semaglutide , 1 MG/DOSE, 4 MG/3ML SOPN 1 week prior to surgery. (Last dose take Monday 07/14/24) Do not take your normal dose TOUJEO  SOLOSTAR 300 UNIT/ML Solostar Pen the morning of your procedure.   Continue taking all of your other prescription medications up until the day of surgery.  ON THE DAY OF SURGERY ONLY TAKE THESE MEDICATIONS WITH SIPS OF WATER:  amLODipine  (NORVASC ) 10 MG  carvedilol  (COREG ) 12.5 MG  dutasteride  (AVODART ) 0.5  gabapentin  (NEURONTIN ) 300 MG  pantoprazole  (PROTONIX ) 40 MG   No Alcohol for 24 hours before or after  surgery.  No Smoking including e-cigarettes for 24 hours before surgery.  No chewable tobacco products for at least 6 hours before surgery.  No nicotine patches on the day of surgery.  Do not use any recreational drugs for at least a week (preferably 2 weeks) before your surgery.  Please be advised that the combination of cocaine and anesthesia may have negative outcomes, up to and including death. If you test positive for cocaine, your surgery will be cancelled.  On the morning of surgery brush your teeth with toothpaste and water, you may rinse your mouth with mouthwash if you wish. Do not swallow any toothpaste or mouthwash.  Use CHG Soap or wipes as directed on instruction sheet.  Do not wear jewelry, make-up, hairpins, clips or nail polish.  For welded (permanent) jewelry: bracelets, anklets, waist bands, etc.  Please have this removed prior to surgery.  If it is not removed, there is a chance that hospital personnel will need to cut it off on the day of surgery.  Do not wear lotions, powders, or perfumes.   Do not shave body hair from the neck down 48 hours before surgery.  Contact lenses, hearing aids and dentures may not be worn into surgery.  Do not bring valuables to the hospital. [facility] is not responsible for any missing/lost belongings or valuables.   Notify your doctor if there is any change in your medical condition (cold, fever, infection).  Wear comfortable clothing (specific to your surgery type) to the hospital.  After surgery, you  can help prevent lung complications by doing breathing exercises.  Take deep breaths and cough every 1-2 hours. Your doctor may order a device called an Incentive Spirometer to help you take deep breaths. When coughing or sneezing, hold a pillow firmly against your incision with both hands. This is called splinting. Doing this helps protect your incision. It also decreases belly discomfort.  If you are being admitted to the  hospital overnight, leave your suitcase in the car. After surgery it may be brought to your room.  In case of increased patient census, it may be necessary for you, the patient, to continue your postoperative care in the Same Day Surgery department.  If you are being discharged the day of surgery, you will not be allowed to drive home. You will need a responsible individual to drive you home and stay with you for 24 hours after surgery.   If you are taking public transportation, you will need to have a responsible individual with you.  Please call the Pre-admissions Testing Dept. at [phone] if you have any questions about these instructions.  Surgery Visitation Policy:  Patients having surgery or a procedure may have two visitors.  Children under the age of 4 must have an adult with them who is not the patient.  Inpatient Visitation:    Visiting hours are 7 a.m. to 8 p.m. Up to four visitors are allowed at one time in a patient room. The visitors may rotate out with other people during the day.  One visitor age 36 or older may stay with the patient overnight and must be in the room by 8 p.m.  Merchandiser, Retail to address health-related social needs:  [email or web address]

## 2024-07-16 ENCOUNTER — Encounter
Admission: RE | Admit: 2024-07-16 | Discharge: 2024-07-16 | Disposition: A | Discharge status: Home / Self Care | Source: Ambulatory Visit | Attending: Urology | Admitting: Urology

## 2024-07-16 DIAGNOSIS — I499 Cardiac arrhythmia, unspecified: Secondary | ICD-10-CM | POA: Diagnosis not present

## 2024-07-16 DIAGNOSIS — I10 Essential (primary) hypertension: Secondary | ICD-10-CM | POA: Diagnosis not present

## 2024-07-16 DIAGNOSIS — Z01812 Encounter for preprocedural laboratory examination: Secondary | ICD-10-CM | POA: Diagnosis present

## 2024-07-16 DIAGNOSIS — E1165 Type 2 diabetes mellitus with hyperglycemia: Secondary | ICD-10-CM | POA: Insufficient documentation

## 2024-07-16 DIAGNOSIS — I447 Left bundle-branch block, unspecified: Secondary | ICD-10-CM | POA: Insufficient documentation

## 2024-07-16 DIAGNOSIS — Z794 Long term (current) use of insulin: Secondary | ICD-10-CM | POA: Diagnosis not present

## 2024-07-16 LAB — BASIC METABOLIC PANEL WITH GFR
Anion gap: 9 (ref 5–15)
BUN: 23 mg/dL (ref 8–23)
CO2: 26 mmol/L (ref 22–32)
Calcium: 9.8 mg/dL (ref 8.9–10.3)
Chloride: 103 mmol/L (ref 98–111)
Creatinine, Ser: 1.41 mg/dL — ABNORMAL HIGH (ref 0.61–1.24)
GFR, Estimated: 53 mL/min — ABNORMAL LOW
Glucose, Bld: 185 mg/dL — ABNORMAL HIGH (ref 70–99)
Potassium: 4.1 mmol/L (ref 3.5–5.1)
Sodium: 139 mmol/L (ref 135–145)

## 2024-07-16 LAB — HEMOGLOBIN A1C
Hgb A1c MFr Bld: 7.4 % — ABNORMAL HIGH (ref 4.8–5.6)
Mean Plasma Glucose: 165.68 mg/dL

## 2024-07-16 LAB — CBC
HCT: 40.7 % (ref 39.0–52.0)
Hemoglobin: 13.3 g/dL (ref 13.0–17.0)
MCH: 29.4 pg (ref 26.0–34.0)
MCHC: 32.7 g/dL (ref 30.0–36.0)
MCV: 89.8 fL (ref 80.0–100.0)
Platelets: 217 K/uL (ref 150–400)
RBC: 4.53 MIL/uL (ref 4.22–5.81)
RDW: 13.2 % (ref 11.5–15.5)
WBC: 5 K/uL (ref 4.0–10.5)
nRBC: 0 % (ref 0.0–0.2)

## 2024-07-16 LAB — MICROALBUMIN / CREATININE URINE RATIO
Creatinine, Urine: 76.7 mg/dL
Microalb/Creat Ratio: 27 mg/g{creat} (ref 0–29)
Microalbumin, Urine: 21 ug/mL

## 2024-07-18 ENCOUNTER — Ambulatory Visit

## 2024-07-18 ENCOUNTER — Ambulatory Visit: Attending: Cardiology

## 2024-07-18 DIAGNOSIS — I639 Cerebral infarction, unspecified: Secondary | ICD-10-CM

## 2024-07-18 DIAGNOSIS — Z01810 Encounter for preprocedural cardiovascular examination: Secondary | ICD-10-CM | POA: Diagnosis not present

## 2024-07-18 LAB — CUP PACEART REMOTE DEVICE CHECK: Date Time Interrogation Session: 20260101231514

## 2024-07-18 NOTE — Progress Notes (Signed)
 "   Virtual Visit via Telephone Note   Because of Gabriel Burton co-morbid illnesses, he is at least at moderate risk for complications without adequate follow up.  This format is felt to be most appropriate for this patient at this time.  Due to technical limitations with video connection (technology), today's appointment will be conducted as an audio only telehealth visit, and Gabriel Burton verbally agreed to proceed in this manner.   All issues noted in this document were discussed and addressed.  No physical exam could be performed with this format.  Evaluation Performed:  Preoperative cardiovascular risk assessment _____________   Date:  07/18/2024   Patient ID:  Gabriel Burton, DOB September 18, 1950, MRN [id number] Patient Location:  Home Provider location:   Office  Primary Care Provider:  Liana Fish, NP Primary Cardiologist:  Lonni Hanson, MD  Chief Complaint / Patient Profile   74 y.o. y/o male with a h/o CVA s/p ILR, LBBB, HTN, HLD, T2DM who is pending Transurethral resection of bladder tumor with intravesical installation of gemcitabine and bilateral retrograde pyelograms on 07/24/2024 and presents today for telephonic preoperative cardiovascular risk assessment.  History of Present Illness    Gabriel Burton is a 74 y.o. male who presents via audio/video conferencing for a telehealth visit today.  Pt was last seen in cardiology clinic on 06/17/2024 by Chantal Needle NP.  At that time Gabriel Burton was doing well.  The patient is now pending procedure as outlined above. Since his last visit, He denies chest pain, palpitations, dyspnea, orthopnea, n, v,  dark/tarry/bloody stools, hematuria, dizziness, syncope, weight gain. He does report some leg edema, states he was seen by his PCP 1 week ago with no recommended changes or concerned noted, he has history of chronic lower extremity edema.   Past Medical History    Past Medical History:  Diagnosis Date   Arthritis     Bladder tumor    Diabetes mellitus without complication (HCC)    Diastolic dysfunction    a. 09/2018 Echo: EF 63%, diast dysfxn. Mild conc LVH. Mild TR.   GERD (gastroesophageal reflux disease)    History of stress test    a. 09/2018 ETT: Ex time 3:50. BP 207/90. Inconclusive 2/2 LBBB.  Stopped 2/2 claudication; b. 12/2018 Lexi MV: fixed septal/apical defect - felt to be artifact 2/2 LBBB. Very mild cor Ca2+ and Ao atherosclerosis. Low-mod risk study. EF 43%.   Hyperlipidemia    Hypertension    a. 08/2020 Renal artery duplex: No evidence of RAS.   Left bundle branch block    Stroke [facility])    Past Surgical History:  Procedure Laterality Date   COLONOSCOPY     LOOP RECORDER IMPLANT  06/14/2022    Allergies  Allergies[1]  Home Medications    Prior to Admission medications  Medication Sig Start Date End Date Taking? Authorizing Provider  amLODipine  (NORVASC ) 10 MG tablet Take 1 tablet (10 mg total) by mouth daily. 02/13/24   Liana Fish, NP  aspirin  EC 81 MG tablet Take 81 mg by mouth daily.     [provider]  atorvastatin  (LIPITOR) 10 MG tablet Take 1 tablet (10 mg total) by mouth at bedtime. 02/13/24   Abernathy, Alyssa, NP  carvedilol  (COREG ) 12.5 MG tablet Take 1 tablet (12.5 mg total) by mouth 2 (two) times daily with a meal. 06/17/24   End, Lonni, MD  Cholecalciferol (VITAMIN D3) 5000 units TABS Take 5,000 Units by mouth daily.  [provider]  CINNAMON PO Take 1 capsule by mouth daily. Patient not taking: Reported on 07/15/2024    [provider]  clopidogrel  (PLAVIX ) 75 MG tablet Take 1 tablet (75 mg total) by mouth daily. 02/13/24   Liana Fish, NP  dutasteride  (AVODART ) 0.5 MG capsule Take 0.5 mg by mouth daily. 06/25/24   [provider]  fluticasone  (FLONASE ) 50 MCG/ACT nasal spray Place 2 sprays into both nostrils daily. Patient taking differently: Place 2 sprays into both nostrils daily as needed for allergies. 08/25/21    Abernathy, Alyssa, NP  gabapentin  (NEURONTIN ) 300 MG capsule Take 1 tablet by mouth in the morning and 2 tablets by mouth in the evening every day. 02/13/24   Abernathy, Alyssa, NP  insulin  glargine, 1 Unit Dial, (TOUJEO  SOLOSTAR) 300 UNIT/ML Solostar Pen Inject 45 Units into the skin daily. 07/15/24   Abernathy, Alyssa, NP  nitroGLYCERIN  (NITROSTAT ) 0.4 MG SL tablet DISSOLVE ONE TABLET UNDER THE TONGUE EVERY 5 MINUTES AS NEEDED FOR CHEST PAIN.  DO NOT EXCEED A TOTAL OF 3 DOSES IN 15 MINUTES 06/01/23   End, Lonni, MD  olmesartan  (BENICAR ) 20 MG tablet Take 1 tablet (20 mg total) by mouth daily. 02/13/24   Abernathy, Alyssa, NP  pantoprazole  (PROTONIX ) 40 MG tablet Take 1 tablet (40 mg total) by mouth daily. 02/13/24   Liana Fish, NP  Semaglutide , 1 MG/DOSE, 4 MG/3ML SOPN Inject 1 mg as directed once a week. 07/15/24   Liana Fish, NP  spironolactone  (ALDACTONE ) 25 MG tablet Take 1 tablet (25 mg total) by mouth daily. 07/15/24   Liana Fish, NP  tamsulosin  (FLOMAX ) 0.4 MG CAPS capsule Take 1 capsule by mouth once daily 06/16/24   Abernathy, Alyssa, NP  traMADol  (ULTRAM ) 50 MG tablet Take 50 mg by mouth every 8 (eight) hours as needed for moderate pain (pain score 4-6). 01/16/24   [provider]  TRUEPLUS 5-BEVEL PEN NEEDLES 32G X 4 MM MISC USE  TWICE DAILY AS DIRECTED 05/15/22   Khan, Fozia M, MD  vitamin C (ASCORBIC ACID) 500 MG tablet Take 500 mg by mouth daily.     [provider]    Physical Exam    Vital Signs:  Gwynn L Lorenson does not have vital signs available for review today.  Given telephonic nature of communication, physical exam is limited. AAOx3. NAD. Normal affect.  Speech and respirations are unlabored.  Accessory Clinical Findings    None  Assessment & Plan    1.  Preoperative Cardiovascular Risk Assessment: According to the Revised Cardiac Risk Index (RCRI), his Perioperative Risk of Major Cardiac Event is (%): 6.6  His Functional  Capacity in METs is: 5.07 according to the Duke Activity Status Index (DASI). Therefore, based on ACC/AHA guidelines, patient would be at moderate-high but acceptable risk for the planned procedure without further cardiovascular testing.   The patient was advised that if he develops new symptoms prior to surgery to contact our office to arrange for a follow-up visit, and he verbalized understanding.  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 Surgeon should contact that office for recommendations.  A copy of this note will be routed to requesting surgeon.  Time:   Today, I have spent 10 minutes with the patient with telehealth technology discussing medical history, symptoms, and management plan.     English Craighead E Lyrah Bradt, NP  07/18/2024, 7:31 AM     [1]  Allergies Allergen Reactions   Lisinopril  Swelling   "

## 2024-07-20 ENCOUNTER — Ambulatory Visit: Payer: Self-pay | Admitting: Cardiology

## 2024-07-21 NOTE — Progress Notes (Signed)
 Cardiac clearance in epic from Kenzie Campbell NP from 07-18-24. Acceptable Risk  Instructions from pcp (Abernathy) to hold Asa and Plavix  5 days prior to procedure with last dose being 07-18-24

## 2024-07-22 NOTE — Progress Notes (Signed)
 Remote Loop Recorder Transmission

## 2024-07-24 ENCOUNTER — Other Ambulatory Visit: Payer: Self-pay

## 2024-07-24 ENCOUNTER — Encounter
Admission: RE | Disposition: A | Discharge status: Home / Self Care | Payer: Self-pay | Source: Home / Self Care | Attending: Urology

## 2024-07-24 ENCOUNTER — Ambulatory Visit

## 2024-07-24 ENCOUNTER — Ambulatory Visit: Admitting: Certified Registered"

## 2024-07-24 ENCOUNTER — Encounter: Payer: Self-pay | Admitting: Urology

## 2024-07-24 ENCOUNTER — Ambulatory Visit
Admission: RE | Admit: 2024-07-24 | Discharge: 2024-07-24 | Disposition: A | Discharge status: Home / Self Care | Attending: Urology | Admitting: Urology

## 2024-07-24 DIAGNOSIS — I129 Hypertensive chronic kidney disease with stage 1 through stage 4 chronic kidney disease, or unspecified chronic kidney disease: Secondary | ICD-10-CM | POA: Diagnosis not present

## 2024-07-24 DIAGNOSIS — E1122 Type 2 diabetes mellitus with diabetic chronic kidney disease: Secondary | ICD-10-CM | POA: Insufficient documentation

## 2024-07-24 DIAGNOSIS — Z794 Long term (current) use of insulin: Secondary | ICD-10-CM | POA: Insufficient documentation

## 2024-07-24 DIAGNOSIS — K219 Gastro-esophageal reflux disease without esophagitis: Secondary | ICD-10-CM | POA: Insufficient documentation

## 2024-07-24 DIAGNOSIS — I447 Left bundle-branch block, unspecified: Secondary | ICD-10-CM | POA: Diagnosis not present

## 2024-07-24 DIAGNOSIS — C675 Malignant neoplasm of bladder neck: Secondary | ICD-10-CM | POA: Diagnosis not present

## 2024-07-24 DIAGNOSIS — Z8249 Family history of ischemic heart disease and other diseases of the circulatory system: Secondary | ICD-10-CM | POA: Insufficient documentation

## 2024-07-24 DIAGNOSIS — C672 Malignant neoplasm of lateral wall of bladder: Secondary | ICD-10-CM | POA: Diagnosis not present

## 2024-07-24 DIAGNOSIS — C679 Malignant neoplasm of bladder, unspecified: Secondary | ICD-10-CM

## 2024-07-24 DIAGNOSIS — N35912 Unspecified bulbous urethral stricture, male: Secondary | ICD-10-CM | POA: Diagnosis not present

## 2024-07-24 DIAGNOSIS — Z87891 Personal history of nicotine dependence: Secondary | ICD-10-CM | POA: Diagnosis not present

## 2024-07-24 DIAGNOSIS — D494 Neoplasm of unspecified behavior of bladder: Secondary | ICD-10-CM

## 2024-07-24 DIAGNOSIS — Z8673 Personal history of transient ischemic attack (TIA), and cerebral infarction without residual deficits: Secondary | ICD-10-CM | POA: Insufficient documentation

## 2024-07-24 DIAGNOSIS — Z7985 Long-term (current) use of injectable non-insulin antidiabetic drugs: Secondary | ICD-10-CM | POA: Insufficient documentation

## 2024-07-24 DIAGNOSIS — N183 Chronic kidney disease, stage 3 unspecified: Secondary | ICD-10-CM | POA: Diagnosis not present

## 2024-07-24 DIAGNOSIS — C674 Malignant neoplasm of posterior wall of bladder: Secondary | ICD-10-CM | POA: Diagnosis not present

## 2024-07-24 HISTORY — PX: CYSTOSCOPY W/ RETROGRADES: SHX1426

## 2024-07-24 HISTORY — PX: BLADDER INSTILLATION: SHX6893

## 2024-07-24 HISTORY — PX: TRANSURETHRAL RESECTION OF BLADDER TUMOR: SHX2575

## 2024-07-24 LAB — GLUCOSE, CAPILLARY
Glucose-Capillary: 134 mg/dL — ABNORMAL HIGH (ref 70–99)
Glucose-Capillary: 150 mg/dL — ABNORMAL HIGH (ref 70–99)

## 2024-07-24 SURGERY — TURBT (TRANSURETHRAL RESECTION OF BLADDER TUMOR)
Anesthesia: General | Site: Ureter

## 2024-07-24 MED ORDER — STERILE WATER FOR IRRIGATION IR SOLN
Status: DC | PRN
  Administered 2024-07-24: 1000 mL

## 2024-07-24 MED ORDER — GEMCITABINE CHEMO FOR BLADDER INSTILLATION 2000 MG
INTRAVENOUS | Status: DC | PRN
  Administered 2024-07-24: 2000 mg via INTRAVESICAL

## 2024-07-24 MED ORDER — GEMCITABINE CHEMO FOR BLADDER INSTILLATION 2000 MG
2000.0000 mg | Freq: Once | INTRAVENOUS | Status: DC
  Filled 2024-07-24: qty 52.6

## 2024-07-24 MED ORDER — CEFAZOLIN SODIUM-DEXTROSE 2-4 GM/100ML-% IV SOLN
2.0000 g | INTRAVENOUS | Status: AC
  Administered 2024-07-24: 2 g via INTRAVENOUS

## 2024-07-24 MED ORDER — LIDOCAINE HCL (PF) 2 % IJ SOLN
INTRAMUSCULAR | Status: AC
  Filled 2024-07-24: qty 5

## 2024-07-24 MED ORDER — DEXAMETHASONE SOD PHOSPHATE PF 10 MG/ML IJ SOLN
INTRAMUSCULAR | Status: DC | PRN
  Administered 2024-07-24: 5 mg via INTRAVENOUS

## 2024-07-24 MED ORDER — FENTANYL CITRATE (PF) 100 MCG/2ML IJ SOLN: 25.0000 ug | INTRAMUSCULAR | Status: DC | PRN

## 2024-07-24 MED ORDER — ORAL CARE MOUTH RINSE: 15.0000 mL | Freq: Once | OROMUCOSAL | Status: AC

## 2024-07-24 MED ORDER — CHLORHEXIDINE GLUCONATE 0.12 % MT SOLN
15.0000 mL | Freq: Once | OROMUCOSAL | Status: AC
  Administered 2024-07-24: 15 mL via OROMUCOSAL

## 2024-07-24 MED ORDER — PROPOFOL 10 MG/ML IV BOLUS
INTRAVENOUS | Status: DC | PRN
  Administered 2024-07-24: 150 mg via INTRAVENOUS
  Administered 2024-07-24: 50 mg via INTRAVENOUS

## 2024-07-24 MED ORDER — EPHEDRINE SULFATE-NACL 50-0.9 MG/10ML-% IV SOSY
PREFILLED_SYRINGE | INTRAVENOUS | Status: DC | PRN
  Administered 2024-07-24: 5 mg via INTRAVENOUS
  Administered 2024-07-24: 10 mg via INTRAVENOUS

## 2024-07-24 MED ORDER — CHLORHEXIDINE GLUCONATE 0.12 % MT SOLN
OROMUCOSAL | Status: AC
  Filled 2024-07-24: qty 15

## 2024-07-24 MED ORDER — LIDOCAINE HCL (CARDIAC) PF 100 MG/5ML IV SOSY
PREFILLED_SYRINGE | INTRAVENOUS | Status: DC | PRN
  Administered 2024-07-24: 100 mg via INTRAVENOUS

## 2024-07-24 MED ORDER — PROPOFOL 10 MG/ML IV BOLUS
INTRAVENOUS | Status: AC
  Filled 2024-07-24: qty 20

## 2024-07-24 MED ORDER — FENTANYL CITRATE (PF) 100 MCG/2ML IJ SOLN
INTRAMUSCULAR | Status: DC | PRN
  Administered 2024-07-24: 50 ug via INTRAVENOUS
  Administered 2024-07-24 (×2): 25 ug via INTRAVENOUS

## 2024-07-24 MED ORDER — SODIUM CHLORIDE 0.9 % IV SOLN: INTRAVENOUS | Status: DC

## 2024-07-24 MED ORDER — ONDANSETRON HCL 4 MG/2ML IJ SOLN
INTRAMUSCULAR | Status: DC | PRN
  Administered 2024-07-24: 4 mg via INTRAVENOUS

## 2024-07-24 MED ORDER — CEFAZOLIN SODIUM-DEXTROSE 2-4 GM/100ML-% IV SOLN
INTRAVENOUS | Status: AC
  Filled 2024-07-24: qty 100

## 2024-07-24 MED ORDER — SODIUM CHLORIDE 0.9 % IR SOLN
Status: DC | PRN
  Administered 2024-07-24: 9000 mL
  Administered 2024-07-24 (×2): 6000 mL
  Administered 2024-07-24: 3000 mL

## 2024-07-24 MED ORDER — ONDANSETRON HCL 4 MG/2ML IJ SOLN
INTRAMUSCULAR | Status: AC
  Filled 2024-07-24: qty 2

## 2024-07-24 MED ORDER — TROSPIUM CHLORIDE 20 MG PO TABS
20.0000 mg | ORAL_TABLET | Freq: Two times a day (BID) | ORAL | 0 refills | Status: AC | PRN
  Filled 2024-07-24: qty 20, 10d supply, fill #0

## 2024-07-24 MED ORDER — DROPERIDOL 2.5 MG/ML IJ SOLN: 0.6250 mg | Freq: Once | INTRAMUSCULAR | Status: DC | PRN

## 2024-07-24 MED ORDER — ACETAMINOPHEN 10 MG/ML IV SOLN
INTRAVENOUS | Status: AC
  Filled 2024-07-24: qty 100

## 2024-07-24 MED ORDER — ACETAMINOPHEN 10 MG/ML IV SOLN
INTRAVENOUS | Status: DC | PRN
  Administered 2024-07-24: 1000 mg via INTRAVENOUS

## 2024-07-24 MED ORDER — PHENYLEPHRINE 80 MCG/ML (10ML) SYRINGE FOR IV PUSH (FOR BLOOD PRESSURE SUPPORT)
PREFILLED_SYRINGE | INTRAVENOUS | Status: DC | PRN
  Administered 2024-07-24 (×4): 80 ug via INTRAVENOUS

## 2024-07-24 MED ORDER — IOHEXOL 180 MG/ML  SOLN
INTRAMUSCULAR | Status: DC | PRN
  Administered 2024-07-24: 10 mL

## 2024-07-24 MED ORDER — FENTANYL CITRATE (PF) 100 MCG/2ML IJ SOLN
INTRAMUSCULAR | Status: AC
  Filled 2024-07-24: qty 2

## 2024-07-24 MED ORDER — PHENYLEPHRINE HCL-NACL 20-0.9 MG/250ML-% IV SOLN
INTRAVENOUS | Status: DC | PRN
  Administered 2024-07-24: 80 ug/min via INTRAVENOUS

## 2024-07-24 SURGICAL SUPPLY — 25 items
BAG DRAIN SIEMENS DORNER NS (MISCELLANEOUS) ×2 IMPLANT
BAG URINE DRAIN 2000ML AR STRL (UROLOGICAL SUPPLIES) ×2 IMPLANT
BRUSH SCRUB EZ 4% CHG (MISCELLANEOUS) ×2 IMPLANT
CATH FOL 2WAY LX 18X30 (CATHETERS) ×2 IMPLANT
CATH SET URETHRAL DILATOR (CATHETERS) IMPLANT
CATH URETL OPEN END 6X70 (CATHETERS) ×2 IMPLANT
DRAPE UTILITY 15X26 TOWEL STRL (DRAPES) ×2 IMPLANT
DRSG TELFA 3X4 N-ADH STERILE (GAUZE/BANDAGES/DRESSINGS) ×2 IMPLANT
ELECTRODE LOOP 22F BIPOLAR SML (ELECTROSURGICAL) IMPLANT
ELECTRODE REM PT RTRN 9FT ADLT (ELECTROSURGICAL) IMPLANT
GLOVE BIOGEL PI IND STRL 7.5 (GLOVE) ×2 IMPLANT
GOWN STRL REUS W/ TWL LRG LVL3 (GOWN DISPOSABLE) ×2 IMPLANT
GOWN STRL REUS W/ TWL XL LVL3 (GOWN DISPOSABLE) ×2 IMPLANT
GOWN STRL REUS W/TWL XL LVL4 (GOWN DISPOSABLE) ×2 IMPLANT
GUIDEWIRE STR DUAL SENSOR (WIRE) ×2 IMPLANT
GUIDEWIRE STR ZIPWIRE 035X150 (MISCELLANEOUS) IMPLANT
KIT TURNOVER CYSTO (KITS) ×2 IMPLANT
LOOP CUT BIPOLAR 24F LRG (ELECTROSURGICAL) IMPLANT
PACK CYSTO AR (MISCELLANEOUS) ×2 IMPLANT
SET CYSTO IRRIGATION (SET/KITS/TRAYS/PACK) ×2 IMPLANT
SET IRRIG Y-TYPE CYSTO (SET/KITS/TRAYS/PACK) ×2 IMPLANT
SOL .9 NS 3000ML IRR UROMATIC (IV SOLUTION) ×4 IMPLANT
SOLN STERILE WATER 500 ML (IV SOLUTION) ×2 IMPLANT
SURGILUBE 2OZ TUBE FLIPTOP (MISCELLANEOUS) ×2 IMPLANT
SYRINGE TOOMEY IRRIG 70ML (MISCELLANEOUS) ×2 IMPLANT

## 2024-07-24 NOTE — Transfer of Care (Signed)
 Immediate Anesthesia Transfer of Care Note  Patient: Gabriel Burton  Procedure(s) Performed: TURBT (TRANSURETHRAL RESECTION OF BLADDER TUMOR) (Bladder) CYSTOSCOPY, WITH RETROGRADE PYELOGRAM (Bilateral: Ureter) INSTILLATION, BLADDER (Bladder)  Patient Location: PACU  Anesthesia Type:General  Level of Consciousness: sedated  Airway & Oxygen Therapy: Patient Spontanous Breathing and Patient connected to face mask oxygen  Post-op Assessment: Report given to RN and Post -op Vital signs reviewed and stable  Post vital signs: Reviewed  Last Vitals:  Vitals Value Taken Time  BP 117/73 07/24/24 11:36  Temp    Pulse 61 07/24/24 11:37  Resp 10 07/24/24 11:37  SpO2 99 % 07/24/24 11:37  Vitals shown include unfiled device data.  Last Pain:  Vitals:   07/24/24 0919  PainSc: 0-No pain         Complications: No notable events documented.

## 2024-07-24 NOTE — Interval H&P Note (Signed)
 History and Physical Interval Note:  07/24/2024 9:25 AM  Gabriel Burton  has presented today for surgery, with the diagnosis of Bladder Tumor.  The various methods of treatment have been discussed with the patient and family. After consideration of risks, benefits and other options for treatment, the patient has consented to  Procedures with comments: TURBT (TRANSURETHRAL RESECTION OF BLADDER TUMOR) (N/A) CYSTOSCOPY, WITH RETROGRADE PYELOGRAM (Bilateral) INSTILLATION, BLADDER (N/A) - GEMCITABINE  as a surgical intervention.  The patient's history has been reviewed, patient examined, no change in status, stable for surgery.  I have reviewed the patient's chart and labs.  Questions were answered to the patient's satisfaction.    CV:RRR Lungs:clear  Gabriel Burton

## 2024-07-24 NOTE — Discharge Instructions (Signed)
 Transurethral Resection of Bladder Tumor (TURBT) or Bladder Biopsy   Definition:  Transurethral Resection of the Bladder Tumor is a surgical procedure used to diagnose and remove tumors within the bladder.   General instructions:     Your recent bladder surgery requires very little post hospital care but some definite precautions.  Despite the fact that no skin incisions were used, the area around the bladder incisions are raw and covered with scabs to promote healing and prevent bleeding. Certain precautions are needed to insure that the scabs are not disturbed over the next 2-4 weeks while the healing proceeds.  Because the raw surface inside your bladder and the irritating effects of urine you may expect frequency of urination and/or urgency (a stronger desire to urinate) and perhaps even getting up at night more often. This will usually resolve or improve slowly over the healing period. You may see some blood in your urine over the first 6 weeks. Do not be alarmed, even if the urine was clear for a while. Get off your feet and drink lots of fluids until clearing occurs. If you start to pass clots or don't improve call us .  Diet:  You may return to your normal diet immediately. Because of the raw surface of your bladder, alcohol, spicy foods, foods high in acid and drinks with caffeine  may cause irritation or frequency and should be used in moderation. To keep your urine flowing freely and avoid constipation, drink plenty of fluids during the day (8-10 glasses). Tip: Avoid cranberry juice because it is very acidic.  Activity:  Your physical activity doesn't need to be restricted. However, if you are very active, you may see some blood in the urine. We suggest that you reduce your activity under the circumstances until the bleeding has stopped.  Bowels:  It is important to keep your bowels regular during the postoperative period. Straining with bowel movements can cause bleeding. A bowel  movement every other day is reasonable. Use a mild laxative if needed, such as milk of magnesia 2-3 tablespoons, or 2 Dulcolax tablets. Call if you continue to have problems. If you had been taking narcotics for pain, before, during or after your surgery, you may be constipated. Take a laxative if necessary.    Medication:  You should resume your pre-surgery medications unless told not to. In addition you may be given an antibiotic to prevent or treat infection. Antibiotics are not always necessary. All medication should be taken as prescribed until the bottles are finished unless you are having an unusual reaction to one of the drugs. A prescription for trospium  was sent to your pharmacy which will help with bladder pain/spasm  [facility] [address], [address] [place], [place] [address] [phone]

## 2024-07-24 NOTE — Anesthesia Procedure Notes (Signed)
 Procedure Name: LMA Insertion Date/Time: 07/24/2024 9:58 AM  Performed by: Jackye Spanner, CRNAPre-anesthesia Checklist: Patient identified, Patient being monitored, Timeout performed, Emergency Drugs available and Suction available Patient Re-evaluated:Patient Re-evaluated prior to induction Oxygen Delivery Method: Circle system utilized Preoxygenation: Pre-oxygenation with 100% oxygen Induction Type: IV induction Ventilation: Mask ventilation without difficulty LMA: LMA inserted LMA Size: 5.0 Tube type: Oral Number of attempts: 1 Placement Confirmation: positive ETCO2 and breath sounds checked- equal and bilateral Tube secured with: Tape Dental Injury: Teeth and Oropharynx as per pre-operative assessment  Comments: Smooth atraumatic LMA placement, no complications noted.

## 2024-07-24 NOTE — Op Note (Signed)
 "  Preoperative diagnosis:  Multifocal bladder tumors  Postoperative diagnosis:  Same  Procedure: Cystoscopy Dilation urethral stricture TURBT (2-5 cm) Bilateral retrograde pyelograms with interpretation Instillation intravesical gemcitabine   Surgeon: [name], MD  Anesthesia: General  Complications: None  Intraoperative findings:  ~61F bulbar urethral stricture Moderate lateral lobe large met with mild bladder neck elevation Multifocal papillary bladder tumors: ~15 mm papillary tumor mid posterior wall ~10 mm papillary tumor in inferoposterior wall  ~15 mm papillary tumor right bladder base ~20 mm papillary tumor left bladder neck Scattered satellite papillary tumors <5 mm bladder base left  Flat mucosa with papillary change left lateral wall largest band <1 cm UOs located close to bladder neck Right retrograde pyelogram: Normal caliber ureter without filling defect, stricture or dilation.  Collecting system normal in appearance without filling defect or dilation Left retrograde pyelogram: Normal caliber ureter without filling defect, stricture or dilation.  Collecting system normal in appearance without filling defect or dilation   EBL: Minimal  Specimens:  Posterior wall bladder tumor Base of posterior wall bladder tumor Tumor right bladder base Tumor left bladder neck Left lateral wall bladder tumor Base of tumor right bladder base  Indication: Gabriel Burton is a 74 y.o. male incidentally found to have small bladder masses on bladder survey views of a renal ultrasound in November 2025.  Office cystoscopy remarkable for multifocal papillary tumors.  Refer to H&P for details.  After reviewing the management options for treatment, he elected to proceed with the above surgical procedure(s). We have discussed the potential benefits and risks of the procedure, side effects of the proposed treatment, the likelihood of the patient achieving the goals of the  procedure, and any potential problems that might occur during the procedure or recuperation. Informed consent has been obtained.  Description of procedure:  The patient was taken to the operating room and general anesthesia was induced.  The patient was placed in the dorsal lithotomy position, prepped and draped in the usual sterile fashion, and preoperative antibiotics were administered. A preoperative time-out was performed.   A 21 French cystoscope sheath with 30 degree lens was lubricated and passed per urethra.  A ~18 French bulbar stricture was encountered which the 30 French scope was unable to negotiate.  A guidewire was placed through the cystoscope and the scope was advanced over the wire into the bladder.  Panendoscopy was then performed with findings as described above.  The guidewire was kept in place and the cystoscope was removed.  The urethra was dilated with disposable urethral dilators from 20-15F.  A 15F continuous-flow resectoscope sheath with visual obturator was lubricated and placed per urethra.  The scope was advanced into the bladder.  The obturator was replaced with a laser bridge.  A 57F open ended ureteral catheter was placed into the right UO and 7 mL of Omnipaque  contrast was instilled under fluoroscopy with findings as described above.  An identical procedure was performed on the contralateral side.  The laser bridge was replaced with an Faustine resectoscope with a loop.  The posterior wall tumors were resected down to superficial muscle.  Hemostasis was obtained with cautery and biopsy of the base was performed with the rigid biopsy forceps.  The tumors of the right bladder base and left bladder neck were then resected and sent separately.  Rigid biopsies were then performed of the papillary tissue left lateral wall and base of the right bladder base tumor and sent separately.  Biopsy sites were then fulgurated  for hemostasis.  The satellite papillary tissue and  papillary tissue noted on the left lateral wall were then fulgurated.  At the completion of procedure hemostasis was adequate and no visible tumor was appreciated on close inspection.  The resectoscope was removed.  An 31F Foley catheter was placed without difficulty.  The cath was irrigated with return of pink-tinged effluent.  A drainage bag was placed and the tubing was clamped.  2000 mg of gemcitabine  was then instilled.  After anesthetic reversal he was transported to the PACU in stable condition.   Plan: After 1 hour gemcitabine  dwell time for catheter will be drained and catheter removed if urine clear-pink Will notify with the pathology results   [name], M.D.  "

## 2024-07-24 NOTE — Anesthesia Postprocedure Evaluation (Signed)
"   Anesthesia Post Note  Patient: TAEGEN DELKER  Procedure(s) Performed: TURBT (TRANSURETHRAL RESECTION OF BLADDER TUMOR) (Bladder) CYSTOSCOPY, WITH RETROGRADE PYELOGRAM (Bilateral: Ureter) INSTILLATION, BLADDER (Bladder)  Patient location during evaluation: PACU Anesthesia Type: General Level of consciousness: awake and alert Pain management: pain level controlled Vital Signs Assessment: post-procedure vital signs reviewed and stable Respiratory status: spontaneous breathing, nonlabored ventilation, respiratory function stable and patient connected to nasal cannula oxygen Cardiovascular status: blood pressure returned to baseline and stable Postop Assessment: no apparent nausea or vomiting Anesthetic complications: no   No notable events documented.   Last Vitals:  Vitals:   07/24/24 1245 07/24/24 1256  BP: 127/77 135/77  Pulse: 69 76  Resp: 10 16  Temp: (!) 36.1 C 36.7 C  SpO2: 98% 96%    Last Pain:  Vitals:   07/24/24 1256  TempSrc: Temporal  PainSc: 3                  Prentice Murphy      "

## 2024-07-24 NOTE — Anesthesia Preprocedure Evaluation (Signed)
"                                    Anesthesia Evaluation  Patient identified by MRN, date of birth, ID band Patient awake    Reviewed: Allergy & Precautions, H&P , NPO status , Patient's Chart, lab work & pertinent test results, reviewed documented beta blocker date and time   History of Anesthesia Complications Negative for: history of anesthetic complications  Airway Mallampati: II  TM Distance: >3 FB Neck ROM: full    Dental  (+) Dental Advidsory Given, Partial Upper, Partial Lower, Missing   Pulmonary neg pulmonary ROS, former smoker   Pulmonary exam normal breath sounds clear to auscultation       Cardiovascular Exercise Tolerance: Good hypertension, (-) angina (-) Past MI and (-) Cardiac Stents Normal cardiovascular exam+ dysrhythmias (LBBB) (-) Valvular Problems/Murmurs Rhythm:regular Rate:Normal     Neuro/Psych neg Seizures CVA, Residual Symptoms  negative psych ROS   GI/Hepatic Neg liver ROS,GERD  ,,  Endo/Other  diabetes    Renal/GU Renal disease (Stage 3 CKD)  negative genitourinary   Musculoskeletal   Abdominal   Peds  Hematology negative hematology ROS (+)   Anesthesia Other Findings Past Medical History: No date: Arthritis No date: Bladder tumor No date: Diabetes mellitus without complication (HCC) No date: Diastolic dysfunction     Comment:  a. 09/2018 Echo: EF 63%, diast dysfxn. Mild conc LVH.               Mild TR. No date: GERD (gastroesophageal reflux disease) No date: History of stress test     Comment:  a. 09/2018 ETT: Ex time 3:50. BP 207/90. Inconclusive 2/2              LBBB.  Stopped 2/2 claudication; b. 12/2018 Lexi MV: fixed              septal/apical defect - felt to be artifact 2/2 LBBB. Very              mild cor Ca2+ and Ao atherosclerosis. Low-mod risk study.              EF 43%. No date: Hyperlipidemia No date: Hypertension     Comment:  a. 08/2020 Renal artery duplex: No evidence of RAS. No date: Left bundle  branch block No date: Stroke (HCC)   Reproductive/Obstetrics negative OB ROS                              Anesthesia Physical Anesthesia Plan  ASA: 3  Anesthesia Plan: General   Post-op Pain Management:    Induction: Intravenous  PONV Risk Score and Plan: 2 and Ondansetron , Dexamethasone  and Treatment may vary due to age or medical condition  Airway Management Planned: LMA and Oral ETT  Additional Equipment:   Intra-op Plan:   Post-operative Plan:   Informed Consent: I have reviewed the patients History and Physical, chart, labs and discussed the procedure including the risks, benefits and alternatives for the proposed anesthesia with the patient or authorized representative who has indicated his/her understanding and acceptance.     Dental Advisory Given  Plan Discussed with: Anesthesiologist, CRNA and Surgeon  Anesthesia Plan Comments:         Anesthesia Quick Evaluation  "

## 2024-07-25 ENCOUNTER — Encounter: Payer: Self-pay | Admitting: Urology

## 2024-07-25 ENCOUNTER — Other Ambulatory Visit: Payer: Self-pay | Admitting: Nurse Practitioner

## 2024-07-25 DIAGNOSIS — E1122 Type 2 diabetes mellitus with diabetic chronic kidney disease: Secondary | ICD-10-CM

## 2024-07-25 LAB — SURGICAL PATHOLOGY

## 2024-07-27 ENCOUNTER — Other Ambulatory Visit: Payer: Self-pay | Admitting: Nurse Practitioner

## 2024-07-27 DIAGNOSIS — N1831 Chronic kidney disease, stage 3a: Secondary | ICD-10-CM

## 2024-07-28 ENCOUNTER — Telehealth: Payer: Self-pay | Admitting: Urology

## 2024-07-28 NOTE — Telephone Encounter (Signed)
 I contacted Gabriel Burton to discuss his bladder pathology results.  He had no postoperative problems.  Intraoperative findings remarkable for multifocal papillary tumors.  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  Based on high-grade tumor I recommended 6-week induction BCG.  We discussed this treatment is intended to prevent recurrence and progression of disease.  We discussed potential side effects.  He has an appointment on 08/05/2024 and we will discuss further at that time

## 2024-08-05 ENCOUNTER — Encounter: Payer: Self-pay | Admitting: Urology

## 2024-08-05 ENCOUNTER — Ambulatory Visit: Admitting: Urology

## 2024-08-05 VITALS — BP 142/77 | HR 85 | Ht 74.0 in | Wt 215.0 lb

## 2024-08-05 DIAGNOSIS — C679 Malignant neoplasm of bladder, unspecified: Secondary | ICD-10-CM | POA: Diagnosis not present

## 2024-08-05 NOTE — Progress Notes (Signed)
 "  08/05/2024 12:44 PM   Gabriel Burton 07/05/1951 [id number]  Referring provider: Liana Fish, NP [address] [place],  [place] [address]  Chief Complaint  Patient presents with   Other   Urologic history:  1.  Urothelial carcinoma bladder Bladder survey views of renal ultrasound performed for renal cyst evaluation incidentally showed 3 bladder masses largest 1.4 cm Office cystoscopy with multifocal bladder tumors TURBT 07/24/2024: 22F bulbar stricture dilated, multifocal bladder tumors and satellite tumors.  Refer to op note for details Negative bilateral retrograde pyelogram Path: Ta HG urothelial carcinoma Post resection gemcitabine  administered  HPI: Gabriel Burton is a 74 y.o. male presents for postop follow-up.  Urologic history as above.  No significant postoperative problems.  Has had intermittent hematuria but not significant He was contacted with his pathology results No complaints today   PMH: Past Medical History:  Diagnosis Date   Arthritis    Bladder tumor    Diabetes mellitus without complication (HCC)    Diastolic dysfunction    a. 09/2018 Echo: EF 63%, diast dysfxn. Mild conc LVH. Mild TR.   GERD (gastroesophageal reflux disease)    History of stress test    a. 09/2018 ETT: Ex time 3:50. BP 207/90. Inconclusive 2/2 LBBB.  Stopped 2/2 claudication; b. 12/2018 Lexi MV: fixed septal/apical defect - felt to be artifact 2/2 LBBB. Very mild cor Ca2+ and Ao atherosclerosis. Low-mod risk study. EF 43%.   Hyperlipidemia    Hypertension    a. 08/2020 Renal artery duplex: No evidence of RAS.   Left bundle branch block    Stroke [facility])     Surgical History: Past Surgical History:  Procedure Laterality Date   BLADDER INSTILLATION N/A 07/24/2024   Procedure: INSTILLATION, BLADDER;  Surgeon: Twylla Glendia BROCKS, MD;  Location: [facility] ORS;  Service: Urology;  Laterality: N/A;  GEMCITABINE    COLONOSCOPY     CYSTOSCOPY W/ RETROGRADES Bilateral 07/24/2024   Procedure:  CYSTOSCOPY, WITH RETROGRADE PYELOGRAM;  Surgeon: Twylla Glendia BROCKS, MD;  Location: [facility] ORS;  Service: Urology;  Laterality: Bilateral;   LOOP RECORDER IMPLANT  06/14/2022   TRANSURETHRAL RESECTION OF BLADDER TUMOR N/A 07/24/2024   Procedure: TURBT (TRANSURETHRAL RESECTION OF BLADDER TUMOR);  Surgeon: Twylla Glendia BROCKS, MD;  Location: [facility] ORS;  Service: Urology;  Laterality: N/A;    Home Medications:  Allergies as of 08/05/2024       Reactions   Lisinopril  Swelling        Medication List        Accurate as of August 05, 2024 12:44 PM. If you have any questions, ask your nurse or doctor.          amLODipine  10 MG tablet Commonly known as: NORVASC  Take 1 tablet (10 mg total) by mouth daily.   ascorbic acid 500 MG tablet Commonly known as: VITAMIN C Take 500 mg by mouth daily.   aspirin  EC 81 MG tablet Take 81 mg by mouth daily.   atorvastatin  10 MG tablet Commonly known as: LIPITOR Take 1 tablet (10 mg total) by mouth at bedtime.   carvedilol  12.5 MG tablet Commonly known as: COREG  Take 1 tablet (12.5 mg total) by mouth 2 (two) times daily with a meal.   CINNAMON PO Take 1 capsule by mouth daily.   clopidogrel  75 MG tablet Commonly known as: PLAVIX  Take 1 tablet (75 mg total) by mouth daily.   dutasteride  0.5 MG capsule Commonly known as: AVODART  Take 0.5 mg by mouth daily.   fluticasone   50 MCG/ACT nasal spray Commonly known as: FLONASE  Place 2 sprays into both nostrils daily. What changed:  when to take this reasons to take this   gabapentin  300 MG capsule Commonly known as: NEURONTIN  Take 1 tablet by mouth in the morning and 2 tablets by mouth in the evening every day.   nitroGLYCERIN  0.4 MG SL tablet Commonly known as: NITROSTAT  DISSOLVE ONE TABLET UNDER THE TONGUE EVERY 5 MINUTES AS NEEDED FOR CHEST PAIN.  DO NOT EXCEED A TOTAL OF 3 DOSES IN 15 MINUTES   olmesartan  20 MG tablet Commonly known as: BENICAR  Take 1 tablet (20 mg total) by mouth  daily.   pantoprazole  40 MG tablet Commonly known as: PROTONIX  Take 1 tablet (40 mg total) by mouth daily.   Semaglutide  (1 MG/DOSE) 4 MG/3ML Sopn Inject 1 mg as directed once a week.   spironolactone  25 MG tablet Commonly known as: ALDACTONE  Take 1 tablet (25 mg total) by mouth daily.   tamsulosin  0.4 MG Caps capsule Commonly known as: FLOMAX  Take 1 capsule by mouth once daily   Toujeo  SoloStar 300 UNIT/ML Solostar Pen Generic drug: insulin  glargine (1 Unit Dial) INJECT 40 UNITS SUBCUTANEOUSLY ONCE DAILY .  MAY  INCREASE  TO  45  UNITS  AS  DIRECTED   traMADol  50 MG tablet Commonly known as: ULTRAM  Take 50 mg by mouth every 8 (eight) hours as needed for moderate pain (pain score 4-6).   trospium  20 MG tablet Commonly known as: SANCTURA  Take 1 tablet (20 mg total) by mouth 2 (two) times daily as needed (frequency,urgency,bladder spasm).   TRUEplus 5-Bevel Pen Needles 32G X 4 MM Misc Generic drug: Insulin  Pen Needle USE  TWICE DAILY AS DIRECTED   Vitamin D3 125 MCG (5000 UT) Tabs Take 5,000 Units by mouth daily.        Allergies: Allergies[1]  Family History: Family History  Problem Relation Age of Onset   Hypertension Mother    Hypertension Father    Heart attack Brother 2    Social History:  reports that he quit smoking about 26 years ago. His smoking use included cigarettes. He started smoking about 34 years ago. He has a 3.2 pack-year smoking history. He has never used smokeless tobacco. He reports that he does not drink alcohol and does not use drugs.   Physical Exam: BP (!) 142/77   Pulse 85   Ht 6' 2 (1.88 m)   Wt 215 lb (97.5 kg)   BMI 27.60 kg/m   Constitutional:  Alert, No acute distress. HEENT: [place] AT Respiratory: Normal respiratory effort, no increased work of breathing. Psychiatric: Normal mood and affect.   Assessment & Plan:    Ta HG urothelial carcinoma bladder The pathology report was again reviewed in detail Recommend 6-week  induction BCG.  We discussed the increased risk of recurrence and progression with high-grade superficial urothelial carcinoma and BCG recommended to decrease this incidence The most common side effects of storage related voiding symptoms was discussed.  Possibility of flulike symptoms and rarely BCG sepsis was also reviewed.  He would like to proceed with further evaluation We discussed the national shortage of BCG but are hopeful we will be able to get started with treatment in the next month   [name], MD  [facility] [address], [address] [place], [place] [address] [phone]     [1]  Allergies Allergen Reactions   Lisinopril  Swelling   "

## 2024-08-05 NOTE — Progress Notes (Signed)
 Patient presents for an office visit. BP today is High. Greater than 140/90. Provider  notified and recheck Blood Pressure .  Pt advised to talk with PCP.  Pt voiced understanding.

## 2024-08-11 LAB — COLOGUARD: COLOGUARD: NEGATIVE

## 2024-08-13 ENCOUNTER — Ambulatory Visit: Payer: Self-pay | Admitting: Nurse Practitioner

## 2024-08-13 NOTE — Progress Notes (Signed)
 Please let the patient know that his cologuard test was negative, will repeat the test in 3 years.

## 2024-08-14 NOTE — Telephone Encounter (Signed)
 Patient notified

## 2024-08-14 NOTE — Telephone Encounter (Signed)
-----   Message from Mardy Maxin, NP sent at 08/13/2024  7:59 AM EST ----- Please let the patient know that his cologuard test was negative, will repeat the test in 3 years.

## 2024-08-18 ENCOUNTER — Ambulatory Visit

## 2024-08-18 DIAGNOSIS — I639 Cerebral infarction, unspecified: Secondary | ICD-10-CM

## 2024-08-19 LAB — CUP PACEART REMOTE DEVICE CHECK: Date Time Interrogation Session: 20260201233141

## 2024-08-22 NOTE — Progress Notes (Signed)
 Remote Loop Recorder Transmission

## 2024-08-25 ENCOUNTER — Ambulatory Visit: Admitting: Medical

## 2024-09-18 ENCOUNTER — Encounter

## 2024-10-14 ENCOUNTER — Ambulatory Visit: Admitting: Nurse Practitioner

## 2024-10-19 ENCOUNTER — Encounter

## 2025-07-20 ENCOUNTER — Ambulatory Visit: Admitting: Nurse Practitioner
